# Patient Record
Sex: Female | Born: 1950 | Race: White | Hispanic: No | Marital: Married | Smoking: Never smoker
Health system: Southern US, Community
[De-identification: ages and names within clinical notes are randomized; demographics above are authoritative.]

## PROBLEM LIST (undated history)

## (undated) HISTORY — PX: KNEE SURGERY: SHX244

## (undated) HISTORY — PX: BACK SURGERY: SHX140

---

## 2002-07-15 ENCOUNTER — Encounter: Admission: RE | Admit: 2002-07-15 | Discharge: 2002-08-03 | Payer: Self-pay | Admitting: Unknown Physician Specialty

## 2004-12-11 ENCOUNTER — Ambulatory Visit (HOSPITAL_COMMUNITY): Admission: RE | Admit: 2004-12-11 | Discharge: 2004-12-11 | Payer: Self-pay | Admitting: *Deleted

## 2005-09-19 ENCOUNTER — Other Ambulatory Visit: Admission: RE | Admit: 2005-09-19 | Discharge: 2005-09-19 | Payer: Self-pay | Admitting: Unknown Physician Specialty

## 2006-03-10 ENCOUNTER — Emergency Department (HOSPITAL_COMMUNITY): Admission: EM | Admit: 2006-03-10 | Discharge: 2006-03-10 | Payer: Self-pay | Admitting: Emergency Medicine

## 2007-03-31 ENCOUNTER — Ambulatory Visit (HOSPITAL_COMMUNITY): Admission: RE | Admit: 2007-03-31 | Discharge: 2007-03-31 | Payer: Self-pay | Admitting: *Deleted

## 2007-04-07 ENCOUNTER — Ambulatory Visit (HOSPITAL_COMMUNITY): Admission: RE | Admit: 2007-04-07 | Discharge: 2007-04-07 | Payer: Self-pay | Admitting: *Deleted

## 2007-04-10 ENCOUNTER — Ambulatory Visit (HOSPITAL_COMMUNITY): Admission: RE | Admit: 2007-04-10 | Discharge: 2007-04-10 | Payer: Self-pay | Admitting: *Deleted

## 2007-05-11 ENCOUNTER — Encounter: Admission: RE | Admit: 2007-05-11 | Discharge: 2007-05-22 | Payer: Self-pay | Admitting: Neurosurgery

## 2007-10-21 ENCOUNTER — Emergency Department (HOSPITAL_COMMUNITY): Admission: EM | Admit: 2007-10-21 | Discharge: 2007-10-21 | Payer: Self-pay | Admitting: Emergency Medicine

## 2007-10-25 ENCOUNTER — Emergency Department (HOSPITAL_COMMUNITY): Admission: EM | Admit: 2007-10-25 | Discharge: 2007-10-26 | Payer: Self-pay | Admitting: Emergency Medicine

## 2008-02-19 ENCOUNTER — Encounter: Admission: RE | Admit: 2008-02-19 | Discharge: 2008-02-19 | Payer: Self-pay | Admitting: Neurosurgery

## 2008-02-25 ENCOUNTER — Observation Stay (HOSPITAL_COMMUNITY): Admission: RE | Admit: 2008-02-25 | Discharge: 2008-02-26 | Payer: Self-pay | Admitting: Neurosurgery

## 2009-04-04 ENCOUNTER — Encounter: Admission: RE | Admit: 2009-04-04 | Discharge: 2009-04-04 | Payer: Self-pay | Admitting: Family Medicine

## 2010-12-16 ENCOUNTER — Encounter: Payer: Self-pay | Admitting: *Deleted

## 2011-04-09 NOTE — Op Note (Signed)
NAMEPEBBLES, ZEIDERS                  ACCOUNT NO.:  [id number]   MEDICAL RECORD NO.:  [id number]          PATIENT TYPE:  INP   LOCATION:  3172                         FACILITY:  [facility]   PHYSICIAN:  Clydene Fake, M.D.  DATE OF BIRTH:  [birth year]   DATE OF PROCEDURE:  02/25/2008  DATE OF DISCHARGE:                               OPERATIVE REPORT   PREOPERATIVE DIAGNOSES:  Lumbar stenosis, spondylosis, herniated nucleus  pulposus with radiculopathy, right side L5-S1.   POSTOPERATIVE DIAGNOSIS:  Lumbar stenosis, spondylosis, herniated  nucleus pulposus with radiculopathy, right side L5-S1.   PROCEDURE:  Right L5 and S1 (two levels).  Decompressive laminectomy,  diskectomy, microdissection with microscope.   SURGEON:  Clydene Fake, M.D.   ASSISTANT:  Coletta Memos, M.D.   ANESTHESIA:  General endotracheal tube anesthesia.   BLOOD LOSS:  Minimal.   BLOOD GIVEN:  None.   DRAINS:  None.   COMPLICATIONS:  None.   REASON FOR PROCEDURE:  The patient is a 60 year old woman who has had  back and right leg pain and numbness for some times.  Epidural  injections have helped but only for a brief period of time.  She has  been on NSIADS.  MRI shows spondylitic change at L5-S1 with facet  hypertrophy, disk herniation, lateral recess stenosis and compression S1  and 5 root.  The patient brought in for a decompressive laminectomy.   PROCEDURE IN DETAIL:  The patient brought to the operating room.  General anesthesia was induced.  The patient was placed in a prone  position on Wilson frame with all pressure points padded.  The patient  was prepped, draped in sterile fashion and site of incision injected  with 10 mL of lidocaine with epinephrine.  The needle was placed in  interspace.  X-rays obtained and showed the needle was pointed at 5-1  interspace.  An incision was then made in midline.  Incision taken down  to the fascia.  Hemostasis obtained with cauterization.  Fascia was  incised  and subperiosteal dissection was done over the L5 and S1 spinous  process lamina out to the facets on the right side.  Self-retaining  retractor was placed, marker was placed at interspace and another x-ray  was obtained confirming our positioning at the L5-S1 area.  Microscope  was brought in for microdissection.  A high-speed drill was used to  start decompressive laminectomy, medial facetectomy.  This completed  with Kerrison punches.  Ligamentum flavum was removed. Foraminotomy was  done over the S1 roots.  There is severe hypertrophic ligament, maybe  even a synovial cyst coming off the joint which was removed  decompressing the lateral recess.  There was hypertrophic facet out to  the foramen.  This was removed with the curettes.  We then explored the  epidural space, subligamentous disk herniation, the disk space incised.  Diskectomy was performed with pituitary rongeurs and curettes.  When we  were finished we had good decompression of both the right L5 and S1  roots and the central canal.  Hemostasis was obtained with bipolar  cauterization  and Gelfoam and thrombin.  Gelfoam was irrigated out.  We  irrigated with antibiotic solution.  We had good hemostasis and we had  good decompression of the central canal and both the L5 and S1 nerve  roots.  Retractors removed.  Fascia closed with 0 Vicryl interrupted  sutures.  Subcutaneous tissue closed with 0, 2-0 and 3-0 Vicryl  interrupted sutures.  Skin closed with benzoin, Steri-Strips.  Dressing  was placed.  The patient was placed back in supine position and woke  from anesthesia and transferred to recovery room in stable condition.           ______________________________  Clydene Fake, M.D.     JRH/MEDQ  D:  02/25/2008  T:  02/25/2008  Job:  [id number]

## 2011-04-09 NOTE — Op Note (Signed)
[name], [name]                  ACCOUNT NO.:  [id number]   MEDICAL RECORD NO.:  [id number]          PATIENT TYPE:  INP   LOCATION:  3172                         FACILITY:  [facility]   PHYSICIAN:  Clydene Fake, M.D.  DATE OF BIRTH:  May 10, 1951   DATE OF PROCEDURE:  02/25/2008  DATE OF DISCHARGE:                               OPERATIVE REPORT   PREOPERATIVE DIAGNOSIS:  Lumbar stenosis, spondylosis, herniated nucleus  pulposus with radiculopathy, right side L5-S1   POSTOPERATIVE DIAGNOSIS:  Lumbar stenosis, spondylosis, herniated  nucleus pulposus with radiculopathy, right side L5-S1.   PROCEDURE:  Right L5-S1 (two levels) decompressive laminectomy,  diskectomy, and microdissection.   SURGEON:  Dr. Phoebe Perch.   ASSISTANT:  Dr. Franky Macho.   General endotracheal tube anesthesia.   ESTIMATED BLOOD LOSS:  Minimal.   BLOOD GIVEN:  None.   DRAINS:  None.   COMPLICATIONS:  None.   REASON FOR PROCEDURE:  The patient is a 60 year old woman who has had  back and right leg pain and numbness.  Epidural injections did help but  did not last.  MRI shows stenosis, spondylosis, lateral recess stenosis,  L5-S1 level on the right side  __________  facets disk protrusion, and  compression of the 5 and S1 roots.  The patient brought in for  decompression.   PROCEDURE IN DETAIL:  The patient brought to operating table, anesthesia  induced.  The patient was placed in prone position in a Wilson frame  with all pressure points padded.  The patient was prepped and draped in  a sterile fashion.  Incision was injected 10 mL of 1% lidocaine with  epinephrine.  Needle was placed in the interspace.  X-rays were obtained  confirming our positioning at L5-S1.  Incision was then made midline  lower lumbar spine over where the needle was.  Incision taken down to  the fascia.  Hemostasis was obtained Bovie cauterization.  The fascia  was incised on the right side, and subperiosteal dissection was done at  L5-S1 spinous processs lamina out to the facet.  Self-retaining  retractors were placed.  Markers were placed at the interspace, and x-  rays were obtained confirming our positioning at L5-S1.  Microscope was  brought in for microdissection.  High-speed drill was used start a  depressive laminectomy and medial facetectomy and was completed Kerrison  punches.  The bone plate was removed.  We decompressed the central  canal, did a foraminotomy of the S1 root.  There was severe hypertrophic  ligament of the facet causing lateral recess stenosis and bit of a  synovial cyst was removed, decompressed the lateral recess ________  ligaments foramen compressing the 5 roots, and we removed those with the  curettes and explored the epidural space and the disk space, found a  large subligamentous disk herniation.  Disk space was incised and  diskectomy performed with pituitary rongeurs and curettes.  When we were  finished, we had good decompression of both the 5 and S1 roots and the  central canal.  Hemostasis was obtained with bipolar cauterization,  Gelfoam and thrombin.  Gelfoam was irrigated out.  We irrigated with  antibiotic solution with good hemostasis.  Retractors were removed.  Fascia was closed with 0 Vicryl interrupted suture.  Subcutaneous tissue  closed with 0, 2-0, and 3-0 Vicryl interrupted sutures.  Skin closed  with benzoin and Steri-Strips.  Dressing was placed.  The patient was  placed in a supine position, awoken from anesthesia, and transferred to  recovery in stable condition.           ______________________________  Clydene Fake, M.D.     JRH/MEDQ  D:  02/25/2008  T:  02/25/2008  Job:  [id number]

## 2011-04-12 ENCOUNTER — Other Ambulatory Visit: Payer: Self-pay | Admitting: Orthopedic Surgery

## 2011-04-12 ENCOUNTER — Encounter (HOSPITAL_COMMUNITY): Payer: BC Managed Care – PPO

## 2011-04-12 LAB — URINALYSIS, ROUTINE W REFLEX MICROSCOPIC
Glucose, UA: NEGATIVE mg/dL
Nitrite: NEGATIVE
Specific Gravity, Urine: 1.017 (ref 1.005–1.030)
pH: 6 (ref 5.0–8.0)

## 2011-04-12 LAB — TYPE AND SCREEN
ABO/RH(D): A POS
Antibody Screen: NEGATIVE

## 2011-04-12 LAB — SURGICAL PCR SCREEN
MRSA, PCR: NEGATIVE
Staphylococcus aureus: NEGATIVE

## 2011-04-12 LAB — COMPREHENSIVE METABOLIC PANEL
Albumin: 3.7 g/dL (ref 3.5–5.2)
BUN: 8 mg/dL (ref 6–23)
Calcium: 9.7 mg/dL (ref 8.4–10.5)
Creatinine, Ser: 0.62 mg/dL (ref 0.4–1.2)
GFR calc Af Amer: >60 mL/min (ref >60)
Total Bilirubin: 0.2 mg/dL — ABNORMAL LOW (ref 0.3–1.2)
Total Protein: 6.7 g/dL (ref 6.0–8.3)

## 2011-04-12 LAB — PROTIME-INR: INR: 0.99 (ref 0.00–1.49)

## 2011-04-12 LAB — CBC
Hemoglobin: 12.4 g/dL (ref 12.0–15.0)
MCV: 88.6 fL (ref 78.0–100.0)
Platelets: 269 10*3/uL (ref 150–400)
RBC: 4.4 MIL/uL (ref 3.87–5.11)
WBC: 5.2 10*3/uL (ref 4.0–10.5)

## 2011-04-12 LAB — APTT: aPTT: 31 seconds (ref 24–37)

## 2011-04-17 ENCOUNTER — Inpatient Hospital Stay (HOSPITAL_COMMUNITY)
Admission: RE | Admit: 2011-04-17 | Discharge: 2011-04-22 | DRG: 209 | Disposition: A | Discharge status: Home Health | Payer: BC Managed Care – PPO | Source: Ambulatory Visit | Attending: Orthopedic Surgery | Admitting: Orthopedic Surgery

## 2011-04-17 DIAGNOSIS — R110 Nausea: Secondary | ICD-10-CM | POA: Diagnosis not present

## 2011-04-17 DIAGNOSIS — Z87440 Personal history of urinary (tract) infections: Secondary | ICD-10-CM

## 2011-04-17 DIAGNOSIS — E039 Hypothyroidism, unspecified: Secondary | ICD-10-CM | POA: Diagnosis present

## 2011-04-17 DIAGNOSIS — K589 Irritable bowel syndrome without diarrhea: Secondary | ICD-10-CM | POA: Diagnosis present

## 2011-04-17 DIAGNOSIS — E785 Hyperlipidemia, unspecified: Secondary | ICD-10-CM | POA: Diagnosis present

## 2011-04-17 DIAGNOSIS — D62 Acute posthemorrhagic anemia: Secondary | ICD-10-CM | POA: Diagnosis not present

## 2011-04-17 DIAGNOSIS — Z01812 Encounter for preprocedural laboratory examination: Secondary | ICD-10-CM

## 2011-04-17 DIAGNOSIS — H9319 Tinnitus, unspecified ear: Secondary | ICD-10-CM | POA: Diagnosis present

## 2011-04-17 DIAGNOSIS — Z780 Asymptomatic menopausal state: Secondary | ICD-10-CM

## 2011-04-17 DIAGNOSIS — M1710 Unilateral primary osteoarthritis, unspecified knee: Principal | ICD-10-CM | POA: Diagnosis present

## 2011-04-18 LAB — BASIC METABOLIC PANEL
Calcium: 7.4 mg/dL — ABNORMAL LOW (ref 8.4–10.5)
GFR calc non Af Amer: >60 mL/min (ref >60)
Glucose, Bld: 121 mg/dL — ABNORMAL HIGH (ref 70–99)
Sodium: 136 mEq/L (ref 135–145)

## 2011-04-18 LAB — CBC
HCT: 27.9 % — ABNORMAL LOW (ref 36.0–46.0)
MCHC: 31.9 g/dL (ref 30.0–36.0)
Platelets: 162 10*3/uL (ref 150–400)
RDW: 13.2 % (ref 11.5–15.5)

## 2011-04-19 LAB — CBC
MCH: 28.9 pg (ref 26.0–34.0)
MCV: 89 fL (ref 78.0–100.0)
Platelets: 159 10*3/uL (ref 150–400)
RDW: 12.9 % (ref 11.5–15.5)
WBC: 6.5 10*3/uL (ref 4.0–10.5)

## 2011-04-19 LAB — BASIC METABOLIC PANEL
BUN: 5 mg/dL — ABNORMAL LOW (ref 6–23)
Creatinine, Ser: 0.49 mg/dL (ref 0.4–1.2)
GFR calc non Af Amer: >60 mL/min (ref >60)
Potassium: 3.9 mEq/L (ref 3.5–5.1)

## 2011-04-19 LAB — PROTIME-INR: Prothrombin Time: 16 seconds — ABNORMAL HIGH (ref 11.6–15.2)

## 2011-04-20 LAB — BASIC METABOLIC PANEL
Calcium: 8.3 mg/dL — ABNORMAL LOW (ref 8.4–10.5)
Creatinine, Ser: 0.53 mg/dL (ref 0.4–1.2)
GFR calc Af Amer: >60 mL/min (ref >60)

## 2011-04-20 LAB — CBC
MCH: 29.2 pg (ref 26.0–34.0)
MCHC: 33 g/dL (ref 30.0–36.0)
Platelets: 196 10*3/uL (ref 150–400)
RDW: 13 % (ref 11.5–15.5)

## 2011-04-20 LAB — PROTIME-INR
INR: 1.54 — ABNORMAL HIGH (ref 0.00–1.49)
Prothrombin Time: 18.7 seconds — ABNORMAL HIGH (ref 11.6–15.2)

## 2011-04-21 LAB — CBC
HCT: 23.9 % — ABNORMAL LOW (ref 36.0–46.0)
MCHC: 32.6 g/dL (ref 30.0–36.0)
RDW: 12.9 % (ref 11.5–15.5)

## 2011-04-21 LAB — PROTIME-INR
INR: 1.93 — ABNORMAL HIGH (ref 0.00–1.49)
Prothrombin Time: 22.2 seconds — ABNORMAL HIGH (ref 11.6–15.2)

## 2011-04-22 LAB — TYPE AND SCREEN
ABO/RH(D): A POS
Antibody Screen: NEGATIVE
Unit division: 0
Unit division: 0

## 2011-04-22 LAB — CBC
HCT: 29.4 % — ABNORMAL LOW (ref 36.0–46.0)
Hemoglobin: 9.7 g/dL — ABNORMAL LOW (ref 12.0–15.0)
MCV: 87.2 fL (ref 78.0–100.0)
RDW: 13.6 % (ref 11.5–15.5)
WBC: 5.1 10*3/uL (ref 4.0–10.5)

## 2011-04-27 NOTE — Op Note (Signed)
[name], [name]                  ACCOUNT NO.:  [id number]  MEDICAL RECORD NO.:  [id number]           PATIENT TYPE:  I  LOCATION:  0005                         FACILITY:  [facility]  PHYSICIAN:  Ollen Gross, M.D.    DATE OF BIRTH:  1951-01-19  DATE OF PROCEDURE: DATE OF DISCHARGE:                              OPERATIVE REPORT   PREOPERATIVE DIAGNOSIS:  Osteoarthritis, bilateral knees.  POSTOPERATIVE DIAGNOSIS:  Osteoarthritis, bilateral knees.  PROCEDURE:  Bilateral total knee arthroplasty.  SURGEON:  Ollen Gross, M.D.  ASSISTANT:  Alexzandrew L. Perkins, P.A.C.  ANESTHESIA:  Combined spinal and epidural.  ESTIMATED BLOOD LOSS:  Minimal.  DRAINS:  Autovac x1 each side.  TOURNIQUET TIME:  Left knee 31 minutes at 300 mmHg, right knee 28 minutes at 300 mmHg.  COMPLICATIONS:  None.  CONDITION:  Stable to recovery.  BRIEF CLINICAL NOTE:  [name] is a 60 year old female with advanced end- stage arthritis of both knees with progressively worsening pain and debilitating function of both.  She has had nonoperative management which has not been successful.  She presents now for bilateral total knee arthroplasty.  PROCEDURE IN DETAIL:  After successful administration of combined spinal and epidural anesthetic, tourniquets were placed high on both sides and both lower extremities are prepped and draped in usual sterile fashion. She was having slightly more symptoms on the left, so we started at the left knee.  Left lower extremity was wrapped in Esmarch, knee flexed and tourniquet inflated to 300 mmHg.  Midline incision was made with a 10 blade through subcutaneous tissue to the level of the extensor mechanism.  A fresh blade is used to make a medial parapatellar arthrotomy.  Soft tissue on the proximal medial tibia is subperiosteally elevated to the joint line with the knife and into the semimembranosus bursa with a Cobb elevator.  Soft tissue laterally is elevated  with attention being paid to avoiding patellar tendon on tibial tubercle. Patella was everted, knee flexed to 90 degrees and ACL and PCL removed. Drill was used to create a starting hole in the distal femur and canal was thoroughly irrigated.  The 5-degree left valgus alignment guide is placed.  Distal femoral cutting block is pinned to remove 10 mm off the distal femur.  Resection is made with an oscillating saw.  The tibia subluxed forward and the menisci are removed.  The extramedullary tibial alignment guide is placed, referencing proximally at the medial aspect of the tibial tubercle and distally along the second metatarsal axis and tibial crest.  The block is pinned to remove 2 mm of the more deficient medial side.  Tibial resection is made with an oscillating saw.  Size 3 is the most appropriate tibial component and proximal tibia is prepared to modular drill and keel punch for the size 3.  The femoral sizing guide is placed, size 3 is most appropriate on the femur.  Rotation is marked off epicondylar axis, confirmed by creating rectangular flexion gap at 90 degrees.  The block is pinned in this rotation.  The anterior, posterior and chamfer cuts made.  Intercondylar block is placed and  that cut was made.  Trial size 3 posterior stabilized femur was placed.  10-mm posterior stabilized rotating platform insert trial was placed.  With 10, full extension was achieved with excellent varus-valgus and anterior-posterior balance throughout full range of motion.  The patella was everted and the thickness measured to be 23 mm.  Freehand resection was taken to 13 mm, 35 template is placed, lug holes were drilled, trial patella was placed and it tracks normally.  Osteophytes removed off the posterior femur with the trial in place.  All trials are removed and the cut bone surfaces are prepared with pulsatile lavage.  Cement was mixed and once ready for implantation, a size 3 mobile bearing  tibial tray, size 3 posterior stabilized femur and 35 patella were cemented into place.  The patella was held with a clamp.  Trial of 10-mm insert was placed, knee held in full extension and all extruded cement removed.  When the cement is fully hardened, then the permanent 10-mm posterior stabilized rotating platform insert is placed in the tibial tray.  Wound was copiously irrigated with saline solution and the arthrotomy closed over an Autovac drain with interrupted #1 PDS.  Flexion against gravity is 140 degrees. Patella tracks normally.  The tourniquet released total time of 31 minutes on the left.  The Autovac was then hooked to suction.  Subcu was closed with interrupted 2-0 Vicryl and subcuticular running 4-0 Monocryl.  The knee was then loosely wrapped with an Esmarch for compression.  The right lower extremity was wrapped in Esmarch, knee flexed and tourniquet inflated to 300 mmHg.  Midline incision made with 10 blade through subcutaneous tissue to the level of the extensor mechanism. Fresh blade is used make a medial parapatellar arthrotomy and soft tissue releases were then performed.  Patella was everted, knee flexed to 90 degrees, ACL and PCL removed.  Drill was used to create a starting hole in the distal femur.  Canal was thoroughly irrigated.  5-degree right valgus alignment guide was placed and the distal femoral cutting block is pinned to remove 10 mm off the distal femur.  Resection is made with an oscillating saw.  The tibia subluxed forward and the menisci removed.  The extramedullary tibial alignment guide is placed, referencing proximally at the medial aspect of the tibial tubercle and distally along the second metatarsal axis and tibial crest.  The block is pinned to remove 2 mm off the more deficient medial side.  Tibial resection was made with an oscillating saw.  Size 3 is the most appropriate tibial component and the proximal tibia was prepared to modular  drill and keel punch for the size 3. Femoral preparation was then performed.  Femoral sizing guide is placed.  The size 3 is most appropriate on the femur.  Rotations marked at the epicondylar axis and confirmed by creating rectangular flexion gap at 90 degrees.  Block was pinned in that rotation and then the anterior, posterior and chamfer cuts are made.  Intercondylar block is placed and that cut was made.  Trial size 3 posterior stabilized femur was placed.  10-mm posterior stabilized rotating platform insert trial was placed.  With 10, full extensions achieved with excellent varus-valgus and anterior-posterior balance throughout full range of motion.  Patella was everted, thickness measured to be 23 mm.  Freehand resection taken to 13 mm, 35 template is placed, lug holes were drilled, trial patella was placed and it tracks normally.  Osteophytes removed off the posterior femur with the trial  in place.  All trials removed and the cut bone surfaces prepared with pulsatile lavage.  Cements mixed and once ready for implantation, the size 3 mobile bearing tibial tray, size 3 posterior stabilized femur, and 35 patella were cemented in place.  Patella was held with a clamp. Trial 10-mm inserts placed, knee held in full extension and all extruded cement removed.  When the cement is fully hardened, then the permanent 10-mm posterior stabilized rotating platform insert was placed into the tibial tray.  Wound was copiously irrigated with saline solution and the arthrotomy closed over Hemovac drain with interrupted #1 PDS.  Flexion against gravity to 140 degrees of patella tracks normally.  The tourniquet released total time of 28 minutes.  Subcu was closed with interrupted 2-0 Vicryl and subcuticular running 4-0 Monocryl.  The Autovac drain is hooked to suction.  Both incisions were then cleaned and dried and Steri-Strips and bulky sterile dressings applied.  She is placed into knee immobilizer  and was awakened and transported to recovery in stable condition.     Ollen Gross, M.D.     FA/MEDQ  D:  04/17/2011  T:  04/17/2011  Job:  [id number]  Electronically Signed by Ollen Gross M.D. on 04/27/2011 04:06:33 PM

## 2011-05-12 ENCOUNTER — Emergency Department (HOSPITAL_COMMUNITY): Payer: BC Managed Care – PPO

## 2011-05-12 ENCOUNTER — Emergency Department (HOSPITAL_COMMUNITY)
Admission: EM | Admit: 2011-05-12 | Discharge: 2011-05-12 | Disposition: A | Discharge status: Home / Self Care | Payer: BC Managed Care – PPO | Attending: Emergency Medicine | Admitting: Emergency Medicine

## 2011-05-12 DIAGNOSIS — F3289 Other specified depressive episodes: Secondary | ICD-10-CM | POA: Insufficient documentation

## 2011-05-12 DIAGNOSIS — M545 Low back pain, unspecified: Secondary | ICD-10-CM | POA: Insufficient documentation

## 2011-05-12 DIAGNOSIS — N201 Calculus of ureter: Secondary | ICD-10-CM | POA: Insufficient documentation

## 2011-05-12 DIAGNOSIS — R112 Nausea with vomiting, unspecified: Secondary | ICD-10-CM | POA: Insufficient documentation

## 2011-05-12 DIAGNOSIS — I10 Essential (primary) hypertension: Secondary | ICD-10-CM | POA: Insufficient documentation

## 2011-05-12 DIAGNOSIS — Z79899 Other long term (current) drug therapy: Secondary | ICD-10-CM | POA: Insufficient documentation

## 2011-05-12 DIAGNOSIS — Z96659 Presence of unspecified artificial knee joint: Secondary | ICD-10-CM | POA: Insufficient documentation

## 2011-05-12 DIAGNOSIS — F329 Major depressive disorder, single episode, unspecified: Secondary | ICD-10-CM | POA: Insufficient documentation

## 2011-05-12 LAB — URINALYSIS, ROUTINE W REFLEX MICROSCOPIC
Glucose, UA: 100 mg/dL — AB
Specific Gravity, Urine: 1.025 (ref 1.005–1.030)
pH: 7 (ref 5.0–8.0)

## 2011-05-12 LAB — DIFFERENTIAL
Eosinophils Relative: 0 % (ref 0–5)
Lymphocytes Relative: 17 % (ref 12–46)
Lymphs Abs: 2 10*3/uL (ref 0.7–4.0)
Neutro Abs: 9 10*3/uL — ABNORMAL HIGH (ref 1.7–7.7)

## 2011-05-12 LAB — URINE MICROSCOPIC-ADD ON

## 2011-05-12 LAB — COMPREHENSIVE METABOLIC PANEL
ALT: 14 U/L (ref 0–35)
Alkaline Phosphatase: 162 U/L — ABNORMAL HIGH (ref 39–117)
CO2: 25 mEq/L (ref 19–32)
Chloride: 97 mEq/L (ref 96–112)
GFR calc Af Amer: 55 mL/min — ABNORMAL LOW (ref >60)
GFR calc non Af Amer: 46 mL/min — ABNORMAL LOW (ref >60)
Glucose, Bld: 119 mg/dL — ABNORMAL HIGH (ref 70–99)
Total Protein: 8.5 g/dL — ABNORMAL HIGH (ref 6.0–8.3)

## 2011-05-12 LAB — CBC
HCT: 43 % (ref 36.0–46.0)
Hemoglobin: 14.2 g/dL (ref 12.0–15.0)
MCV: 87.8 fL (ref 78.0–100.0)
RBC: 4.9 MIL/uL (ref 3.87–5.11)
RDW: 13.4 % (ref 11.5–15.5)
WBC: 11.8 10*3/uL — ABNORMAL HIGH (ref 4.0–10.5)

## 2011-05-12 LAB — PROTIME-INR
INR: 1.29 (ref 0.00–1.49)
Prothrombin Time: 16.3 seconds — ABNORMAL HIGH (ref 11.6–15.2)

## 2011-05-12 LAB — POCT PREGNANCY, URINE: Preg Test, Ur: NEGATIVE

## 2011-05-14 ENCOUNTER — Ambulatory Visit: Payer: BC Managed Care – PPO | Admitting: Physical Therapy

## 2011-05-14 LAB — URINE CULTURE: Culture  Setup Time: 201206172107

## 2011-05-15 NOTE — Discharge Summary (Addendum)
[name], [name]                  ACCOUNT NO.:  [id number]  MEDICAL RECORD NO.:  [id number]  LOCATION:  1608                         FACILITY:  [facility]  PHYSICIAN:  Rozell Searing, [facility]    DATE OF BIRTH:  09/13/1951  DATE OF ADMISSION:  04/17/2011 DATE OF DISCHARGE:  04/22/2011                              DISCHARGE SUMMARY   ADMITTING DIAGNOSES: 1. End-stage arthritis of both knees. 2. Tinnitus. 3. Depression. 4. Hyperlipidemia. 5. Irritable bowel syndrome. 6. Hypothyroidism. 7. History of urinary tract infection. 8. Arthritis. 9. History of menopause.  DISCHARGE DIAGNOSES: 1. End-stage arthritis of both knees, status post bilateral total knee     arthroplasty. 2. Acute blood loss anemia. 3. Tinnitus. 4. Depression. 5. Hyperlipidemia. 6. Irritable bowel syndrome. 7. Hypothyroidism. 8. History of urinary tract infection. 9. Arthritis. 10.History of menopause.  LABORATORY DATA:  Postoperative day #1, the patient's hemoglobin had dropped to 8.9, hematocrit 27.9.  Her INR was 1.06.  Postop day 3, the patient's hemoglobin had dropped to 8.7, hematocrit 26.8.  Her INR had reached 1.26.  Postop day 4, hemoglobin was at 8.8.  Postoperative day 5, hemoglobin dropped to a low of 7.8.  She did require 2 units of packed red blood cells and her INR had reached 1.93.  Postoperative day 6, her hemoglobin was at 9.7 post transfusion and her INR was still subtherapeutic at 1.78.  Her chemistry was monitored throughout her hospital stay.  She had consistently elevated glucose, the high being 121 on postop day 1.  PROCEDURE:  On Apr 17, 2011, [name] was taken to the operating room by surgeon Dr. Ollen Gross and assistant, Ellwood Dense P.A.  She underwent bilateral total knee arthroplasty.  The procedure was performed under spinal anesthesia.  She received antibiotics preoperatively.  There are no complications with the procedure.  She was returned to the recovery room in  satisfactory condition.  HOSPITAL COURSE:  [name] was admitted to [facility] on Apr 17, 2011.  She underwent the above-stated procedure without complication.  After spending adequate time in the recovery room, she was then taken to the orthopedic floor.  She was started on Coumadin. Heparin protocol per pharmacy.  She was also on reduced dose PCA, morphine and muscle relaxants for pain control and the regular postoperative orders for total joint replacement.  Postoperative day 1, the patient was experiencing some nausea.  Her antiemetic was changed to Phenergan.  Postoperative day 2, the morphine PCA was discontinued and she was started on reduced dose of Dilaudid PCA for better pain control. Her Hemovac were pulled.  She did start physical therapy on postoperative day 2.  She was able to ambulate 5 feet in the morning session and 6 feet in the afternoon.  Postop day 3, she progressed and was able to walk, able to ambulate 24 feet in the morning and 35 feet in the afternoon session and work on transfers.  On postoperative day 4, she received 2 units of packed red blood cells for acute blood loss anemia and all home health arrangements were made.  She continued to progress with physical therapy and on postoperative day 5, she was  discharged to home.  MEDICATIONS ON DISCHARGE: 1. Aleve, this will be discontinued while the patient is on Coumadin. 2. Fish oil, this will be discontinued while she is on Coumadin. 3. Livalo. 4. Vitamin D2 discontinued while on Coumadin. 5. Xanax. 6. Percocet. 7. Levothyroxine. 8. Zyrtec. 9. Lexapro.  ACTIVITY:  The patient will increase her activity slowly.  She will have home therapy come out to the house to work with her.  DIET:  No restrictions.  WOUND CARE:  She will need daily dressing change.  FOLLOWUP:  She will follow up with Dr. Lequita Halt in 2 weeks from the day of surgery.  CONDITION ON DISCHARGE:  Improving.     Rozell Searing, [facility]     LD/MEDQ  D:  05/13/2011  T:  05/13/2011  Job:  [id number]  Electronically Signed by Rozell Searing  on 05/15/2011 08:00:20 AM Electronically Signed by Ranee Gosselin M.D. on 06/07/2011 03:43:41 PM

## 2011-05-21 ENCOUNTER — Ambulatory Visit: Payer: BC Managed Care – PPO | Attending: Orthopedic Surgery | Admitting: Physical Therapy

## 2011-05-21 DIAGNOSIS — R269 Unspecified abnormalities of gait and mobility: Secondary | ICD-10-CM | POA: Insufficient documentation

## 2011-05-21 DIAGNOSIS — R5381 Other malaise: Secondary | ICD-10-CM | POA: Insufficient documentation

## 2011-05-21 DIAGNOSIS — IMO0001 Reserved for inherently not codable concepts without codable children: Secondary | ICD-10-CM | POA: Insufficient documentation

## 2011-05-21 DIAGNOSIS — M25669 Stiffness of unspecified knee, not elsewhere classified: Secondary | ICD-10-CM | POA: Insufficient documentation

## 2011-05-21 DIAGNOSIS — M25569 Pain in unspecified knee: Secondary | ICD-10-CM | POA: Insufficient documentation

## 2011-05-22 ENCOUNTER — Ambulatory Visit: Payer: BC Managed Care – PPO | Admitting: Physical Therapy

## 2011-05-23 ENCOUNTER — Ambulatory Visit: Payer: BC Managed Care – PPO | Admitting: Physical Therapy

## 2011-06-10 NOTE — H&P (Signed)
  [name], [name]                  ACCOUNT NO.:  [id number]  MEDICAL RECORD NO.:  [id number]  LOCATION:  1608                         FACILITY:  [facility]  PHYSICIAN:  Ollen Gross, M.D.    DATE OF BIRTH:  1951-11-24  DATE OF ADMISSION:  04/17/2011 DATE OF DISCHARGE:  04/22/2011                             HISTORY & PHYSICAL   CHIEF COMPLAINT:  Bilateral knee pain.  HISTORY OF PRESENT ILLNESS:  The patient is a 60 year old female who has been seen by Dr. Lequita Halt for ongoing bilateral knee pain.  She has known arthritis in both knees.  It is felt she would benefit from undergoing surgical intervention.  Risks and benefits have been discussed versus doing one versus two.  She elects to proceed with bilateral knee arthroplasties.  ALLERGIES:  No known drug allergies.  INTOLERANCES:  Occasionally Percocet will cause nausea, although she is able to take low-dose Percocet and tolerate this and it appears to be more dose dependent.  CURRENT MEDICATIONS:  Lexapro, Percocet, levothyroxine, Zyrtec, Zantac, fish oil, Levola, Septra DS, Voltaren.  PAST MEDICAL HISTORY:  Tinnitus, history depression, hypercholesterolemia, irritable bowel syndrome, hypothyroidism, history of urinary tract infection, postmenopausal.  PAST SURGICAL HISTORY:  Cesarean section x2 in 1986 and 1988, and disk surgery.  FAMILY HISTORY:  Father with hypertension.  Mother with heart failure.  SOCIAL HISTORY:  Married.  Nutrition Production designer, theatre/television/film.  Nonsmoker.  No alcohol. She does have caregiver lined up.  REVIEW OF SYSTEMS:  GENERAL:  No fevers, chills, night sweats.  NEURO: Little bit of ringing in her ears.  No seizures, syncope or paralysis. RESPIRATORY:  No shortness breath, productive cough or hemoptysis. CARDIOVASCULAR:  No chest pain, angina, or orthopnea.  GI:  Some intermittent diarrhea and constipation.  She does have history of IBS. GU:  No dysuria, hematuria or discharge.  MUSCULOSKELETAL:  Knee  pain.  PHYSICAL EXAMINATION:  VITAL SIGNS:  Pulse 84, respirations 12, blood pressure 124/78. GENERAL:  A 60 year old white female, well nourished, well developed, no acute distress.  She is alert, oriented, cooperative, pleasant, good historian, accompanied by her husband. HEENT:  Normocephalic, atraumatic.  Pupils round and reactive.  EOMs intact. NECK:  Supple. CHEST:  Clear. HEART:  Regular rate and rhythm without murmur.  S1, S2 noted. ABDOMEN:  Soft, slight round.  Bowel sounds present. RECTAL:  Not done, not pertinent to present illness. BREASTS:  Not done, not pertinent to present illness. GENITALIA:  Not done, not pertinent to present illness. EXTREMITIES:  Right knee, range of motion 0 to 125, marked crepitus, tender more medial than lateral left knee.  Range of motion 5 to 125, marked crepitus, tender more medial than lateral.  IMPRESSION:  Osteoarthritis, bilateral knees.  PLAN:  The patient admitted to [facility], undergo bilateral total knee replacement arthroplasty.  Surgery will be performed by Dr. Ollen Gross.     [name], P.A.C.   ______________________________ Ollen Gross, M.D.    ALP/MEDQ  D:  06/06/2011  T:  06/06/2011  Job:  [id number]  Electronically Signed by Patrica Duel P.A.C. on 06/06/2011 12:35:49 PM Electronically Signed by Ollen Gross M.D. on 06/10/2011 06:51:07 AM

## 2011-08-20 LAB — PROTIME-INR
INR: 0.9
Prothrombin Time: 11.8

## 2011-08-20 LAB — BASIC METABOLIC PANEL
Calcium: 9.4
Chloride: 106
Creatinine, Ser: 0.79
GFR calc Af Amer: >60

## 2011-08-20 LAB — URINALYSIS, ROUTINE W REFLEX MICROSCOPIC
Nitrite: NEGATIVE
Protein, ur: NEGATIVE
Urobilinogen, UA: 0.2

## 2011-08-20 LAB — CBC
MCV: 88.1
RBC: 4.47
WBC: 6.8

## 2011-08-20 LAB — APTT: aPTT: 30

## 2011-09-03 LAB — CBC
Hemoglobin: 13.3
RBC: 4.51
RDW: 12.9

## 2011-09-03 LAB — BASIC METABOLIC PANEL
Calcium: 9.3
Calcium: 9.6
Creatinine, Ser: 0.71
GFR calc Af Amer: >60
GFR calc Af Amer: >60
GFR calc non Af Amer: >60
Sodium: 137

## 2011-09-03 LAB — URINALYSIS, ROUTINE W REFLEX MICROSCOPIC
Bilirubin Urine: NEGATIVE
Leukocytes, UA: NEGATIVE
Nitrite: NEGATIVE
Specific Gravity, Urine: 1.022
pH: 7

## 2011-09-03 LAB — DIFFERENTIAL
Basophils Absolute: 0.1
Lymphocytes Relative: 25
Monocytes Absolute: 0.3
Monocytes Relative: 4
Neutro Abs: 5.3
Neutrophils Relative %: 68

## 2015-07-11 ENCOUNTER — Encounter (HOSPITAL_COMMUNITY): Payer: Self-pay | Admitting: Emergency Medicine

## 2015-07-11 ENCOUNTER — Emergency Department (HOSPITAL_COMMUNITY)
Admission: EM | Admit: 2015-07-11 | Discharge: 2015-07-11 | Disposition: A | Discharge status: Home / Self Care | Payer: BC Managed Care – PPO | Attending: Emergency Medicine | Admitting: Emergency Medicine

## 2015-07-11 DIAGNOSIS — T63481A Toxic effect of venom of other arthropod, accidental (unintentional), initial encounter: Secondary | ICD-10-CM | POA: Diagnosis present

## 2015-07-11 DIAGNOSIS — Y998 Other external cause status: Secondary | ICD-10-CM | POA: Insufficient documentation

## 2015-07-11 DIAGNOSIS — Y9289 Other specified places as the place of occurrence of the external cause: Secondary | ICD-10-CM | POA: Insufficient documentation

## 2015-07-11 DIAGNOSIS — Y9389 Activity, other specified: Secondary | ICD-10-CM | POA: Diagnosis not present

## 2015-07-11 MED ORDER — FAMOTIDINE 20 MG PO TABS
20.0000 mg | ORAL_TABLET | Freq: Once | ORAL | Status: AC
  Administered 2015-07-11: 20 mg via ORAL
  Filled 2015-07-11: qty 1

## 2015-07-11 MED ORDER — DIPHENHYDRAMINE HCL 25 MG PO CAPS
50.0000 mg | ORAL_CAPSULE | Freq: Once | ORAL | Status: AC
  Administered 2015-07-11: 50 mg via ORAL
  Filled 2015-07-11: qty 2

## 2015-07-11 MED ORDER — HYDROCODONE-ACETAMINOPHEN 5-325 MG PO TABS
1.0000 | ORAL_TABLET | Freq: Once | ORAL | Status: AC
  Administered 2015-07-11: 1 via ORAL
  Filled 2015-07-11: qty 1

## 2015-07-11 MED ORDER — HYDROCODONE-ACETAMINOPHEN 5-325 MG PO TABS: 1.0000 | ORAL_TABLET | ORAL | Status: DC | PRN

## 2015-07-11 MED ORDER — IBUPROFEN 400 MG PO TABS
400.0000 mg | ORAL_TABLET | Freq: Once | ORAL | Status: AC
  Administered 2015-07-11: 400 mg via ORAL
  Filled 2015-07-11: qty 1

## 2015-07-11 NOTE — Discharge Instructions (Signed)

## 2015-07-11 NOTE — ED Notes (Signed)
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

## 2015-07-11 NOTE — ED Provider Notes (Signed)
CSN: [id number]     Arrival date & time 07/11/15  2018 History  This chart was scribed for Orpah Greek, MD by Irene Pap, ED Scribe. This patient was seen in room APA11/AP[facility]11 and patient care was started at 8:56 PM.   Chief Complaint  Patient presents with  . Insect Bite   The history is provided by the patient. No language interpreter was used.  HPI Comments: [name] is a 64 y.o. female who presents to the Emergency Department complaining of an insect bite to the upper left arm onset 2 days ago. Pt states that she does not know what exactly the bite was caused by. States that the bump has become increasingly larger, redder, more painful and has tingling radiating down her arm. She denies any other symptoms.   History reviewed. No pertinent past medical history. Past Surgical History  Procedure Laterality Date  . Cesarean section    . Knee surgery      Bilaterally   No family history on file. Social History  Substance Use Topics  . Smoking status: Never Smoker   . Smokeless tobacco: None  . Alcohol Use: No   OB History    No data available     Review of Systems  Skin: Positive for rash.  Neurological:       Tingling down the left arm  All other systems reviewed and are negative.     Allergies  Bee venom  Home Medications   Prior to Admission medications   Not on File   BP 152/67 mmHg  Pulse 89  Temp(Src) 98.1 F (36.7 C) (Oral)  Resp 20  Ht 5\' 5"  (1.651 m)  Wt 175 lb (79.379 kg)  BMI 29.12 kg/m2  SpO2 100%  Physical Exam  Constitutional: She is oriented to person, place, and time. She appears well-developed and well-nourished. No distress.  HENT:  Head: Normocephalic and atraumatic.  Right Ear: Hearing normal.  Left Ear: Hearing normal.  Nose: Nose normal.  Mouth/Throat: Oropharynx is clear and moist and mucous membranes are normal.  Eyes: Conjunctivae and EOM are normal. Pupils are equal, round, and reactive to light.  Neck: Normal  range of motion. Neck supple.  Cardiovascular: Regular rhythm, S1 normal and S2 normal.  Exam reveals no gallop and no friction rub.   No murmur heard. Pulmonary/Chest: Effort normal and breath sounds normal. No respiratory distress. She exhibits no tenderness.  Abdominal: Soft. Normal appearance and bowel sounds are normal. There is no hepatosplenomegaly. There is no tenderness. There is no rebound, no guarding, no tenderness at McBurney's point and negative Murphy's sign. No hernia.  Musculoskeletal: Normal range of motion.  Neurological: She is alert and oriented to person, place, and time. She has normal strength. No cranial nerve deficit or sensory deficit. Coordination normal. GCS eye subscore is 4. GCS verbal subscore is 5. GCS motor subscore is 6.  Skin: Skin is warm, dry and intact. No rash noted. No cyanosis.  Slightly raised erythematous patch left bicep region, mildly warm and tender to the touch, tiny central raised area  Psychiatric: She has a normal mood and affect. Her speech is normal and behavior is normal. Thought content normal.  Nursing note and vitals reviewed.   ED Course  Procedures (including critical care time) DIAGNOSTIC STUDIES: Oxygen Saturation is 100% on RA, normal by my interpretation.    COORDINATION OF CARE: 8:59 PM-Discussed treatment plan which includes icing the area, pain medication, and monitoring the pt with pt  at bedside and pt agreed to plan.   Labs Review Labs Reviewed - No data to display  Imaging Review No results found.    EKG Interpretation None      MDM   Final diagnoses:  None  insect sting   Presents to the ER for evaluation of concern over a bite or sting to left arm. 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 Patient is not expressing any systemic symptoms. She does have evidence of local reaction at the site, has been monitored here in the ER for a period of time. She has been provided with Benadryl, Pepcid and has  done well. Upon filling, throat swelling, difficulty breathing. Examination was otherwise unremarkable. Patient will continue to ice the area, use Benadryl as needed.  I personally performed the services described in this documentation, which was scribed in my presence. The recorded information has been reviewed and is accurate.      Orpah Greek, MD 07/11/15 2219

## 2015-07-11 NOTE — ED Notes (Signed)
Unknown insect bite 2 hours ago. Redness to left upper arm getting larger and painful.

## 2015-07-26 MED FILL — Hydrocodone-Acetaminophen Tab 5-325 MG: ORAL | Qty: 6 | Status: AC

## 2016-08-08 ENCOUNTER — Encounter: Payer: Self-pay | Admitting: Physician Assistant

## 2016-08-08 ENCOUNTER — Ambulatory Visit (INDEPENDENT_AMBULATORY_CARE_PROVIDER_SITE_OTHER): Payer: BC Managed Care – PPO | Admitting: Physician Assistant

## 2016-08-08 VITALS — BP 129/80 | HR 92 | Temp 97.9°F | Ht 65.0 in | Wt 182.6 lb

## 2016-08-08 DIAGNOSIS — N6012 Diffuse cystic mastopathy of left breast: Secondary | ICD-10-CM

## 2016-08-08 DIAGNOSIS — N644 Mastodynia: Secondary | ICD-10-CM

## 2016-08-08 DIAGNOSIS — N6011 Diffuse cystic mastopathy of right breast: Secondary | ICD-10-CM

## 2016-08-08 NOTE — Progress Notes (Signed)
BP 129/80 (BP Location: Right Arm, Patient Position: Sitting, Cuff Size: Large)   Pulse 92   Temp 97.9 F (36.6 C) (Oral)   Ht 5\' 5"  (1.651 m)   Wt 182 lb 9.6 oz (82.8 kg)   BMI 30.39 kg/m    Subjective:    Patient ID: [name], female    DOB: 09/04/1951, 65 y.o.   MRN: [id number]  [name] is a 65 y.o. female presenting on 08/08/2016 for Breast Pain (left breast-covered in moles )  HPI Patient here to be established as new patient at [place].  This patient is known to me from [facility]. Medical history and meds are reviewed.   Today she has left breast pain and fibrocystic changes bilaterally, She is due a mammogram at this time and when she called to get her appointment and because of her current issues she needs diagnostics scheduled through [place]. We will certainly get this arranged. Some of the pain is worsened with pulling and pushing. In addition more moles are present on the breast. Denies fever and chills.   Relevant past medical, surgical, family and social history reviewed and updated as indicated. Interim medical history since our last visit reviewed. Allergies and medications reviewed and updated.   Data reviewed from any sources in EPIC.  Review of Systems  Constitutional: Negative.  Negative for activity change, fatigue and fever.  HENT: Negative.   Eyes: Negative.   Respiratory: Negative.  Negative for cough.   Cardiovascular: Negative.  Negative for chest pain.  Gastrointestinal: Negative.  Negative for abdominal pain.  Endocrine: Negative.   Genitourinary: Negative.  Negative for dysuria.  Musculoskeletal: Positive for myalgias.  Skin: Negative.   Neurological: Negative.     Per HPI unless specifically indicated above  Social History   Social History  . Marital status: Married    Spouse name: N/A  . Number of children: N/A  . Years of education: N/A   Occupational History  . Not on file.   Social  History Main Topics  . Smoking status: Never Smoker  . Smokeless tobacco: Not on file  . Alcohol use No  . Drug use: Unknown  . Sexual activity: Not on file   Other Topics Concern  . Not on file   Social History Narrative  . No narrative on file    Past Surgical History:  Procedure Laterality Date  . CESAREAN SECTION    . KNEE SURGERY     Bilaterally    No family history on file.    Medication List       Accurate as of 08/08/16  3:06 PM. Always use your most recent med list.          ALPRAZolam 0.5 MG tablet Commonly known as:  XANAX Take 0.5 mg by mouth at bedtime as needed. For sleep   escitalopram 10 MG tablet Commonly known as:  LEXAPRO Take 10 mg by mouth every evening.   FISH OIL PO Take 1 capsule by mouth daily.          Objective:    BP 129/80 (BP Location: Right Arm, Patient Position: Sitting, Cuff Size: Large)   Pulse 92   Temp 97.9 F (36.6 C) (Oral)   Ht 5\' 5"  (1.651 m)   Wt 182 lb 9.6 oz (82.8 kg)   BMI 30.39 kg/m   Allergies  Allergen Reactions  . Bee Venom Swelling   Wt Readings from Last 3 Encounters:  08/08/16 182 lb 9.6 oz (82.8 kg)  07/11/15 175 lb (79.4 kg)    Physical Exam  Constitutional: She is oriented to person, place, and time. She appears well-developed and well-nourished.  HENT:  Head: Normocephalic and atraumatic.  Eyes: Conjunctivae and EOM are normal. Pupils are equal, round, and reactive to light.  Cardiovascular: Normal rate, regular rhythm, normal heart sounds and intact distal pulses.   Pulmonary/Chest: Effort normal and breath sounds normal. She exhibits tenderness. She exhibits no bony tenderness and no retraction. Right breast exhibits no inverted nipple, no mass, no nipple discharge, no skin change and no tenderness. Left breast exhibits skin change and tenderness. Left breast exhibits no inverted nipple, no mass and no nipple discharge. Breasts are symmetrical.    Prominent vein on left breast from  9o'clock to top of areola  Abdominal: Soft. Bowel sounds are normal.  Neurological: She is alert and oriented to person, place, and time. She has normal reflexes.  Skin: Skin is warm and dry. No rash noted. No pallor.  Psychiatric: She has a normal mood and affect. Her behavior is normal. Judgment and thought content normal.  Nursing note and vitals reviewed.       Assessment & Plan:   1. Pain of left breast Plan diagnostic mammogram studies - MM Digital Diagnostic Bilat; Future  2. Fibrocystic breast changes, bilateral - MM Digital Diagnostic Bilat; Future   Continue all other maintenance medications as listed above. Educational handout given for fibrocystic breasts  Follow up plan: Follow for routine check and physical soon and annual lab studies.  Terald Sleeper PA-C [facility] [address]  [place], [place] [address] [phone]   08/08/2016, 3:06 PM

## 2016-08-08 NOTE — Patient Instructions (Signed)
Fibrocystic Breast Changes °Fibrocystic breast changes occur when breast ducts become blocked, causing painful, fluid-filled lumps (cysts) to form in the breast. This is a common condition that is noncancerous (benign). It occurs when women go through hormonal changes during their menstrual cycle. Fibrocystic breast changes can affect one or both breasts. °CAUSES  °The exact cause of fibrocystic breast changes is not known, but it may be related to the female hormones estrogen and progesterone. Family traits that get passed from parent to child (genetics) may also be a factor in some cases. °SIGNS AND SYMPTOMS  °· Tenderness, mild discomfort, or pain.   °· Swelling.   °· Rope-like feeling when touching the breast.   °· Lumpy breast, one or both sides.   °· Changes in breast size, especially before (larger) and after (smaller) the menstrual period.   °· Green or dark brown nipple discharge (not blood).   °Symptoms are usually worse before menstrual periods start and get better toward the end of the menstrual period.  °DIAGNOSIS  °To make a diagnosis, your health care provider will ask you questions and perform a physical exam of your breasts. The health care provider may recommend other tests that can examine inside your breasts, such as: °· A breast X-ray (mammogram).   °· Ultrasonography.  °· An MRI.   °If something more than fibrocystic breast changes is suspected, your health care provider may take a breast tissue sample (breast biopsy) to examine. °TREATMENT  °Often, treatment is not needed. Your health care provider may recommend over-the-counter pain relievers to help lessen pain or discomfort caused by the fibrocystic breast changes. You may also be asked to change your diet to limit or stop eating foods or drinking beverages that contain caffeine. Foods and beverages that contain caffeine include chocolate, soda, coffee, and tea. Reducing sugar and fat in your diet may also help. Your health care provider  may also recommend: °· Fine needle aspiration to remove fluid from a cyst that is causing pain.   °· Surgery to remove a large, persistent, and tender cyst. °HOME CARE INSTRUCTIONS  °· Examine your breasts after every menstrual period. If you do not have menstrual periods, check your breasts the first day of every month. Feel for changes, such as more tenderness, a new growth, a change in breast size, or a change in a lump that has always been there.   °· Only take over-the-counter or prescription medicine as directed by your health care provider.   °· Wear a well-fitted support or sports bra, especially when exercising.   °· Decrease or avoid caffeine, fat, and sugar in your diet as directed by your health care provider.   °SEEK MEDICAL CARE IF:  °· You have fluid leaking (discharge) from your nipples, especially bloody discharge.   °· You have new lumps or bumps in the breast.   °· Your breast or breasts become enlarged, red, and painful.   °· You have areas of your breast that pucker in.   °· Your nipples appear flat or indented.   °  °This information is not intended to replace advice given to you by your health care provider. Make sure you discuss any questions you have with your health care provider. °  °Document Released: 08/28/2006 Document Revised: 08/02/2015 Document Reviewed: 05/02/2013 °Elsevier Interactive Patient Education ©2016 [employer] ° ° °

## 2016-08-14 ENCOUNTER — Other Ambulatory Visit: Payer: Self-pay | Admitting: Physician Assistant

## 2016-08-14 DIAGNOSIS — N644 Mastodynia: Secondary | ICD-10-CM

## 2016-08-20 ENCOUNTER — Ambulatory Visit
Admission: RE | Admit: 2016-08-20 | Discharge: 2016-08-20 | Disposition: A | Discharge status: Home / Self Care | Payer: BC Managed Care – PPO | Source: Ambulatory Visit | Attending: Physician Assistant | Admitting: Physician Assistant

## 2016-08-20 DIAGNOSIS — N6011 Diffuse cystic mastopathy of right breast: Secondary | ICD-10-CM

## 2016-08-20 DIAGNOSIS — N644 Mastodynia: Secondary | ICD-10-CM

## 2016-08-20 DIAGNOSIS — N6012 Diffuse cystic mastopathy of left breast: Secondary | ICD-10-CM

## 2016-11-13 ENCOUNTER — Telehealth: Payer: Self-pay | Admitting: Physician Assistant

## 2016-12-20 ENCOUNTER — Telehealth: Payer: Self-pay | Admitting: Physician Assistant

## 2016-12-20 MED ORDER — OSELTAMIVIR PHOSPHATE 75 MG PO CAPS: 75.0000 mg | ORAL_CAPSULE | Freq: Two times a day (BID) | ORAL | 0 refills | Status: DC

## 2016-12-20 NOTE — Telephone Encounter (Signed)
Prescription sent to pharmacy  sent

## 2016-12-20 NOTE — Telephone Encounter (Signed)
Patient aware.

## 2016-12-23 ENCOUNTER — Encounter: Payer: Self-pay | Admitting: Nurse Practitioner

## 2016-12-23 ENCOUNTER — Ambulatory Visit (INDEPENDENT_AMBULATORY_CARE_PROVIDER_SITE_OTHER): Payer: BC Managed Care – PPO | Admitting: Nurse Practitioner

## 2016-12-23 ENCOUNTER — Ambulatory Visit (INDEPENDENT_AMBULATORY_CARE_PROVIDER_SITE_OTHER): Payer: BC Managed Care – PPO

## 2016-12-23 VITALS — BP 132/75 | HR 99 | Temp 97.6°F | Ht 65.0 in | Wt 177.0 lb

## 2016-12-23 DIAGNOSIS — J209 Acute bronchitis, unspecified: Secondary | ICD-10-CM | POA: Diagnosis not present

## 2016-12-23 DIAGNOSIS — R05 Cough: Secondary | ICD-10-CM | POA: Diagnosis not present

## 2016-12-23 DIAGNOSIS — R059 Cough, unspecified: Secondary | ICD-10-CM

## 2016-12-23 MED ORDER — PREDNISONE 20 MG PO TABS: ORAL_TABLET | ORAL | 0 refills | Status: DC

## 2016-12-23 MED ORDER — LEVALBUTEROL HCL 1.25 MG/3ML IN NEBU
1.2500 mg | INHALATION_SOLUTION | RESPIRATORY_TRACT | Status: AC
  Administered 2016-12-23: 1.25 mg via RESPIRATORY_TRACT

## 2016-12-23 MED ORDER — AZITHROMYCIN 250 MG PO TABS: ORAL_TABLET | ORAL | 0 refills | Status: DC

## 2016-12-23 MED ORDER — HYDROCODONE-HOMATROPINE 5-1.5 MG/5ML PO SYRP: 5.0000 mL | ORAL_SOLUTION | Freq: Four times a day (QID) | ORAL | 0 refills | Status: DC | PRN

## 2016-12-23 NOTE — Progress Notes (Signed)
Subjective:    Patient ID: [name], female    DOB: 1951/01/30, 66 y.o.   MRN: [id number]  HPI Patient comes in c/o cough and congestion that has been intermit since November. 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 Cough is getting worse instead of better.   Review of Systems  Constitutional: Positive for appetite change (decreased), chills and fever (?).  HENT: Positive for congestion and rhinorrhea. Negative for ear discharge, ear pain, postnasal drip, sinus pain, sore throat, trouble swallowing and voice change.   Respiratory: Positive for cough (deep cough) and shortness of breath (occasionally).   Cardiovascular: Negative for chest pain, palpitations and leg swelling.  Gastrointestinal: Negative.   Genitourinary: Negative.   Neurological: Positive for headaches.  Psychiatric/Behavioral: Negative.   All other systems reviewed and are negative.      Objective:   Physical Exam  Constitutional: She is oriented to person, place, and time. She appears well-developed and well-nourished. She appears distressed (mild).  HENT:  Right Ear: Hearing, tympanic membrane, external ear and ear canal normal.  Left Ear: Hearing, tympanic membrane, external ear and ear canal normal.  Nose: Mucosal edema and rhinorrhea present. Right sinus exhibits no maxillary sinus tenderness and no frontal sinus tenderness. Left sinus exhibits no maxillary sinus tenderness and no frontal sinus tenderness.  Mouth/Throat: Mucous membranes are normal.  Eyes: Conjunctivae are normal. Pupils are equal, round, and reactive to light.  Neck: Normal range of motion. Neck supple.  Cardiovascular: Normal rate, regular rhythm and normal heart sounds.   Pulmonary/Chest: Effort normal. She has wheezes (exp scattered throughout lung foields).  Wet cough   Musculoskeletal: Normal range of motion.  Lymphadenopathy:    She has no cervical adenopathy.    Neurological: She is alert and oriented to person, place, and time.  Skin: Skin is warm.  Psychiatric: She has a normal mood and affect. Her behavior is normal. Judgment and thought content normal.   BP 132/75   Pulse 99   Temp 97.6 F (36.4 C) (Oral)   Ht 5\' 5"  (1.651 m)   Wt 177 lb (80.3 kg)   BMI 29.45 kg/m    chest x ray- chronic bronchitic changes   S/P xopenex treatment- loooser wheezes Assessment & Plan:  1. Cough - levalbuterol (XOPENEX) nebulizer solution 1.25 mg; Take 1.25 mg by nebulization now. - DG Chest 2 View; Future  2. Acute bronchitis, unspecified organism Meds ordered this encounter  Medications  . levalbuterol (XOPENEX) nebulizer solution 1.25 mg  . predniSONE (DELTASONE) 20 MG tablet    Sig: 2 po at sametime daily for 5 days    Dispense:  10 tablet    Refill:  0    Order Specific Question:   Supervising Provider    Answer:   VINCENT, CAROL L [[id number]]  . azithromycin (ZITHROMAX Z-PAK) 250 MG tablet    Sig: As directed    Dispense:  6 tablet    Refill:  0    Order Specific Question:   Supervising Provider    Answer:   VINCENT, CAROL L [[id number]]  . HYDROcodone-homatropine (HYCODAN) 5-1.5 MG/5ML syrup    Sig: Take 5 mLs by mouth every 6 (six) hours as needed for cough.    Dispense:  120 mL    Refill:  0    Order Specific Question:   Supervising Provider    Answer:   VINCENT, CAROL L [[id number]]   1. Take meds  as prescribed 2. Use a cool mist humidifier especially during the winter months and when heat has been humid. 3. Use saline nose sprays frequently 4. Saline irrigations of the nose can be very helpful if done frequently.  * 4X daily for 1 week*  * Use of a nettie pot can be helpful with this. Follow directions with this* 5. Drink plenty of fluids 6. Keep thermostat turn down low 7.For any cough or congestion  Use plain Mucinex- regular strength or max strength is fine   * Children- consult with Pharmacist for dosing 8. For fever or aces or pains-  take tylenol or ibuprofen appropriate for age and weight.  * for fevers greater than 101 orally you may alternate ibuprofen and tylenol every  3 hours.   Mary-Margaret Hassell Done, FNP

## 2016-12-23 NOTE — Patient Instructions (Signed)

## 2017-01-15 ENCOUNTER — Other Ambulatory Visit: Payer: Self-pay | Admitting: *Deleted

## 2017-01-15 MED ORDER — ESCITALOPRAM OXALATE 10 MG PO TABS: 10.0000 mg | ORAL_TABLET | Freq: Every evening | ORAL | 1 refills | Status: DC

## 2017-02-07 ENCOUNTER — Encounter: Payer: Self-pay | Admitting: Physician Assistant

## 2017-02-07 ENCOUNTER — Ambulatory Visit (INDEPENDENT_AMBULATORY_CARE_PROVIDER_SITE_OTHER): Payer: BC Managed Care – PPO | Admitting: Physician Assistant

## 2017-02-07 VITALS — BP 112/71 | HR 82 | Temp 97.6°F | Ht 65.0 in | Wt 182.0 lb

## 2017-02-07 DIAGNOSIS — G8929 Other chronic pain: Secondary | ICD-10-CM | POA: Insufficient documentation

## 2017-02-07 DIAGNOSIS — Z01419 Encounter for gynecological examination (general) (routine) without abnormal findings: Secondary | ICD-10-CM | POA: Insufficient documentation

## 2017-02-07 DIAGNOSIS — M25522 Pain in left elbow: Secondary | ICD-10-CM

## 2017-02-07 DIAGNOSIS — Z0000 Encounter for general adult medical examination without abnormal findings: Secondary | ICD-10-CM

## 2017-02-07 MED ORDER — ALPRAZOLAM 0.5 MG PO TABS: 0.5000 mg | ORAL_TABLET | Freq: Every evening | ORAL | 5 refills | Status: DC | PRN

## 2017-02-07 MED ORDER — NAPROXEN 500 MG PO TABS: 500.0000 mg | ORAL_TABLET | Freq: Two times a day (BID) | ORAL | 1 refills | Status: DC

## 2017-02-07 MED ORDER — ESCITALOPRAM OXALATE 10 MG PO TABS: 10.0000 mg | ORAL_TABLET | Freq: Every evening | ORAL | 3 refills | Status: DC

## 2017-02-07 NOTE — Progress Notes (Signed)
BP 112/71   Pulse 82   Temp 97.6 F (36.4 C) (Oral)   Ht '5\' 5"'$  (1.651 m)   Wt 182 lb (82.6 kg)   BMI 30.29 kg/m    Subjective:    Patient ID: [name], female    DOB: 06-09-1951, 65 y.o.   MRN: [id number]  HPI: [name] is a 66 y.o. female presenting on 02/07/2017 for Annual Exam and Elbow Pain (left and swollen )  This patient comes in for annual well physical examination. All medications are reviewed today. There are no reports of any problems with the medications. All of the medical conditions are reviewed and updated.  Lab work is reviewed and will be ordered as medically necessary. Worsening pain in medial lef telbow and noticeable deformity. 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 She is currently having to some lifting and taking care of small grandchild. The pain is predominantly in the medial portion of the left elbow does get warm at times. There's been no specific area of swelling like a bursa. She denies any fever or chills. Patient reports doing well overall.   History reviewed. No pertinent past medical history. Relevant past medical, surgical, family and social history reviewed and updated as indicated. Interim medical history since our last visit reviewed. Allergies and medications reviewed and updated. DATA REVIEWED: CHART IN EPIC  Social History   Social History  . Marital status: Married    Spouse name: N/A  . Number of children: N/A  . Years of education: N/A   Occupational History  . Not on file.   Social History Main Topics  . Smoking status: Never Smoker  . Smokeless tobacco: Never Used  . Alcohol use No  . Drug use: Unknown  . Sexual activity: Not on file   Other Topics Concern  . Not on file   Social History Narrative  . No narrative on file    Past Surgical History:  Procedure Laterality Date  . CESAREAN SECTION    . KNEE SURGERY     Bilaterally    History reviewed. No pertinent family  history.  Review of Systems  Constitutional: Negative.  Negative for activity change, fatigue and fever.  HENT: Negative.   Eyes: Negative.   Respiratory: Negative.  Negative for cough.   Cardiovascular: Negative.  Negative for chest pain.  Gastrointestinal: Negative.  Negative for abdominal pain.  Endocrine: Negative.   Genitourinary: Negative.  Negative for dysuria.  Musculoskeletal: Positive for arthralgias and joint swelling.  Skin: Negative.   Neurological: Negative.     Allergies as of 02/07/2017      Reactions   Bee Venom Swelling      Medication List       Accurate as of 02/07/17 11:17 AM. Always use your most recent med list.          ALPRAZolam 0.5 MG tablet Commonly known as:  XANAX Take 1 tablet (0.5 mg total) by mouth at bedtime as needed. For sleep   escitalopram 10 MG tablet Commonly known as:  LEXAPRO Take 1 tablet (10 mg total) by mouth every evening.   FISH OIL PO Take 1 capsule by mouth daily.   naproxen 500 MG tablet Commonly known as:  NAPROSYN Take 1 tablet (500 mg total) by mouth 2 (two) times daily with a meal.          Objective:    BP 112/71   Pulse 82  Temp 97.6 F (36.4 C) (Oral)   Ht '5\' 5"'$  (1.651 m)   Wt 182 lb (82.6 kg)   BMI 30.29 kg/m   Allergies  Allergen Reactions  . Bee Venom Swelling    Wt Readings from Last 3 Encounters:  02/07/17 182 lb (82.6 kg)  12/23/16 177 lb (80.3 kg)  08/08/16 182 lb 9.6 oz (82.8 kg)    Physical Exam  Constitutional: She is oriented to person, place, and time. She appears well-developed and well-nourished.  HENT:  Head: Normocephalic and atraumatic.  Right Ear: Tympanic membrane, external ear and ear canal normal.  Left Ear: Tympanic membrane, external ear and ear canal normal.  Nose: Nose normal. No rhinorrhea.  Mouth/Throat: Oropharynx is clear and moist and mucous membranes are normal. No oropharyngeal exudate or posterior oropharyngeal erythema.  Eyes: Conjunctivae and EOM are  normal. Pupils are equal, round, and reactive to light.  Neck: Normal range of motion. Neck supple.  Cardiovascular: Normal rate, regular rhythm, normal heart sounds and intact distal pulses.   Pulmonary/Chest: Effort normal and breath sounds normal.  Abdominal: Soft. Bowel sounds are normal.  Musculoskeletal:       Right shoulder: She exhibits tenderness, swelling and deformity.       Arms: Neurological: She is alert and oriented to person, place, and time. She has normal reflexes.  Skin: Skin is warm and dry. No rash noted.  Psychiatric: She has a normal mood and affect. Her behavior is normal. Judgment and thought content normal.        Assessment & Plan:   1. Well adult exam Recheck annual one year No pap needed due to history and age. Last pap 05/2015 normal Manual exam every 2 years.  2. Elbow pain, chronic, left - Ambulatory referral to Orthopedic Surgery - CMP14+EGFR - CBC with Differential/Platelet - Lipid panel - naproxen (NAPROSYN) 500 MG tablet; Take 1 tablet (500 mg total) by mouth 2 (two) times daily with a meal.  Dispense: 60 tablet; Refill: 1    Current Outpatient Prescriptions:  .  ALPRAZolam (XANAX) 0.5 MG tablet, Take 1 tablet (0.5 mg total) by mouth at bedtime as needed. For sleep, Disp: 30 tablet, Rfl: 5 .  escitalopram (LEXAPRO) 10 MG tablet, Take 1 tablet (10 mg total) by mouth every evening., Disp: 90 tablet, Rfl: 3 .  Omega-3 Fatty Acids (FISH OIL PO), Take 1 capsule by mouth daily., Disp: , Rfl:  .  naproxen (NAPROSYN) 500 MG tablet, Take 1 tablet (500 mg total) by mouth 2 (two) times daily with a meal., Disp: 60 tablet, Rfl: 1   Continue all other maintenance medications as listed above.  Follow up plan: Return if symptoms worsen or fail to improve.  Educational handout given for elbow pain  Terald Sleeper PA-C [facility] [address]  [place], [place] [address] [phone]   02/07/2017, 11:17 AM

## 2017-02-07 NOTE — Patient Instructions (Signed)
Elbow Bursitis  A bursa is a fluid-filled sac that covers and protects a joint. Bursitis is when the fluid-filled sac gets puffy and sore (inflamed). Elbow bursitis, also called olecranon bursitis, happens over your elbow. This may be caused by:  · Injury (acute trauma) to your elbow.  · Leaning on hard surfaces for long periods of time.  · Infection from an injury that breaks the skin near your elbow.  · A bone growth (spur) that forms at the tip of your elbow.  · A medical condition that causes inflammation in your body, such as:  ? Gout.  ? Rheumatoid arthritis.    Sometimes the cause is not known.  Follow these instructions at home:  · Take medicines only as told by your doctor.  · If you were prescribed an antibiotic medicine, finish all of it even if you start to feel better.  · If your bursitis is caused by an injury, rest your elbow and wear your bandage as told by your doctor. You may also apply ice to the injured area as told by your doctor:  ? Put ice in a plastic bag.  ? Place a towel between your skin and the bag.  ? Leave the ice on for 20 minutes, 2-3 times per day.  · Do not do any activities that cause pain to your elbow.  · Use elbow pads or wraps to cushion your elbow.  Contact a doctor if:  · You have a fever.  · Your symptoms do not get better with treatment.  · Your pain or swelling gets worse.  · Your pain or swelling goes away and then comes back.  · You have drainage of pus from the swollen area over your elbow.  This information is not intended to replace advice given to you by your health care provider. Make sure you discuss any questions you have with your health care provider.  Document Released: 05/01/2010 Document Revised: 04/18/2016 Document Reviewed: 07/20/2014  Elsevier Interactive Patient Education © 2017 Elsevier Inc.

## 2017-02-08 LAB — CMP14+EGFR
A/G RATIO: 1.6 (ref 1.2–2.2)
ALBUMIN: 4.1 g/dL (ref 3.6–4.8)
ALT: 18 IU/L (ref 0–32)
AST: 30 IU/L (ref 0–40)
Alkaline Phosphatase: 109 IU/L (ref 39–117)
BUN / CREAT RATIO: 14 (ref 12–28)
BUN: 10 mg/dL (ref 8–27)
Bilirubin Total: 0.3 mg/dL (ref 0.0–1.2)
CO2: 23 mmol/L (ref 18–29)
CREATININE: 0.72 mg/dL (ref 0.57–1.00)
Calcium: 9.1 mg/dL (ref 8.7–10.3)
Chloride: 103 mmol/L (ref 96–106)
GFR calc non Af Amer: 89 mL/min/{1.73_m2} (ref >59)
GFR, EST AFRICAN AMERICAN: 103 mL/min/{1.73_m2} (ref >59)
Globulin, Total: 2.5 g/dL (ref 1.5–4.5)
Glucose: 85 mg/dL (ref 65–99)
POTASSIUM: 4 mmol/L (ref 3.5–5.2)
SODIUM: 143 mmol/L (ref 134–144)
TOTAL PROTEIN: 6.6 g/dL (ref 6.0–8.5)

## 2017-02-08 LAB — CBC WITH DIFFERENTIAL/PLATELET
BASOS: 1 %
Basophils Absolute: 0.1 10*3/uL (ref 0.0–0.2)
EOS (ABSOLUTE): 0.2 10*3/uL (ref 0.0–0.4)
EOS: 4 %
HEMATOCRIT: 40 % (ref 34.0–46.6)
Hemoglobin: 12.8 g/dL (ref 11.1–15.9)
IMMATURE GRANS (ABS): 0 10*3/uL (ref 0.0–0.1)
Immature Granulocytes: 0 %
Lymphocytes Absolute: 2.1 10*3/uL (ref 0.7–3.1)
Lymphs: 39 %
MCH: 28.4 pg (ref 26.6–33.0)
MCHC: 32 g/dL (ref 31.5–35.7)
MCV: 89 fL (ref 79–97)
MONOS ABS: 0.2 10*3/uL (ref 0.1–0.9)
Monocytes: 4 %
NEUTROS ABS: 2.8 10*3/uL (ref 1.4–7.0)
NEUTROS PCT: 52 %
Platelets: 209 10*3/uL (ref 150–379)
RBC: 4.51 x10E6/uL (ref 3.77–5.28)
RDW: 14.3 % (ref 12.3–15.4)
WBC: 5.4 10*3/uL (ref 3.4–10.8)

## 2017-02-08 LAB — LIPID PANEL
CHOL/HDL RATIO: 7.5 ratio — AB (ref 0.0–4.4)
Cholesterol, Total: 293 mg/dL — ABNORMAL HIGH (ref 100–199)
HDL: 39 mg/dL — ABNORMAL LOW (ref >39)
LDL Calculated: 203 mg/dL — ABNORMAL HIGH (ref 0–99)
Triglycerides: 255 mg/dL — ABNORMAL HIGH (ref 0–149)
VLDL CHOLESTEROL CAL: 51 mg/dL — AB (ref 5–40)

## 2017-02-13 ENCOUNTER — Other Ambulatory Visit: Payer: Self-pay | Admitting: *Deleted

## 2017-02-13 DIAGNOSIS — E785 Hyperlipidemia, unspecified: Secondary | ICD-10-CM

## 2017-02-13 MED ORDER — ATORVASTATIN CALCIUM 20 MG PO TABS: 20.0000 mg | ORAL_TABLET | Freq: Every day | ORAL | 1 refills | Status: DC

## 2017-02-24 ENCOUNTER — Telehealth: Payer: Self-pay | Admitting: Physician Assistant

## 2017-02-24 NOTE — Telephone Encounter (Signed)
Pt has had myalgia's since starting lipitor. She has also had a rash from her ankles up to her knees since then, 2 wks now.  Rash, no blisters. Has used epson salt & baking soda which eases some Hydrocortisone, benadry & poison ivy cream States not flea bites or poison ivy Please advise

## 2017-02-24 NOTE — Telephone Encounter (Signed)
Pt aware to DC Lipitor And to be seen for her rash

## 2017-02-24 NOTE — Telephone Encounter (Signed)
Covering for PCP.   DC lipitpor. It would be helpful to see her and document findings as well as check labs.   Laroy Apple, MD [facility] 02/24/2017, 5:19 PM

## 2017-02-25 ENCOUNTER — Encounter: Payer: Self-pay | Admitting: Family Medicine

## 2017-02-25 ENCOUNTER — Ambulatory Visit (INDEPENDENT_AMBULATORY_CARE_PROVIDER_SITE_OTHER): Payer: BC Managed Care – PPO | Admitting: Family Medicine

## 2017-02-25 VITALS — BP 121/69 | HR 90 | Temp 96.8°F | Ht 65.0 in | Wt 178.8 lb

## 2017-02-25 DIAGNOSIS — R634 Abnormal weight loss: Secondary | ICD-10-CM | POA: Diagnosis not present

## 2017-02-25 DIAGNOSIS — Z789 Other specified health status: Secondary | ICD-10-CM

## 2017-02-25 DIAGNOSIS — R21 Rash and other nonspecific skin eruption: Secondary | ICD-10-CM

## 2017-02-25 MED ORDER — TRIAMCINOLONE ACETONIDE 0.5 % EX OINT: 1.0000 "application " | TOPICAL_OINTMENT | Freq: Two times a day (BID) | CUTANEOUS | 0 refills | Status: DC

## 2017-02-25 NOTE — Progress Notes (Addendum)
   HPI  Patient presents today with rash and muscle soreness.  Patient developed 2-3 weeks of muscle soreness and rash after starting Lipitor, she's had improvement since stopping Lipitor.  The rash is very itchy in bilateral lower extremities.  States at times it appears that she has bruises on the legs.  Is concerned about some weight loss, she's had 14 pound weight loss since December without attempt at losing weight. However she has had this illness and acute illness in December.   PMH: Smoking status noted ROS: Per HPI  Objective: BP 121/69   Pulse 90   Temp (!) 96.8 F (36 C) (Oral)   Ht '5\' 5"'$  (1.651 m)   Wt 178 lb 12.8 oz (81.1 kg)   BMI 29.75 kg/m  Gen: NAD, alert, cooperative with exam HEENT: NCAT CV: RRR, good S1/S2, no murmur Resp: CTABL, no wheezes, non-labored Ext: No edema, warm Neuro: Alert and oriented, No gross deficits Skin: Tender 20 a proximal 1 cm in diameter roughly circular slightly erythematous lesions on the bilateral lower extremities extending to the midcalf from the ankles bilaterally.  . Vitals - 1 value per visit 02/25/2017 02/07/2017 12/23/2016 08/08/2016 07/11/2015  Weight (lb) 178.8 182 177 182.6 175    Assessment and plan:  # Intolerance Patient with muscle pain and stiffness along with development of rash after starting statin, all are improving after stopping 3 days ago. Given Kenalog ointment for rash. DC statins for now, may like to retry pravastatin or livalo labs  # Rash Possibly drug reaction, also possibly in different type of rash Treat with steroid ointment for now, monitor for resolution. Follow-up as needed   Weight loss Discussed with patient, objectively this is not concerning. Her weight on our exams from the prior to Christmas and after have not changed very significantly. Encouraged colonoscopy, patient will consider    Orders Placed This Encounter  Procedures  . CK  . CMP14+EGFR  . CBC with  Differential/Platelet    Meds ordered this encounter  Medications  . triamcinolone ointment (KENALOG) 0.5 %    Sig: Apply 1 application topically 2 (two) times daily.    Dispense:  30 g    Refill:  Desha, MD [facility] 02/25/2017, 12:24 PM

## 2017-02-25 NOTE — Patient Instructions (Addendum)
Great to meet you!  Try the ointment on your rash, we will call with labs within 1 week.   Please plan to come back to See [facility] in 1-2 month sto discuss alternatives to lipitor.

## 2017-02-25 NOTE — Addendum Note (Signed)
Addended by: Timmothy Euler on: 02/25/2017 12:26 PM   Modules accepted: Level of Service

## 2017-02-26 LAB — CBC WITH DIFFERENTIAL/PLATELET
Basophils Absolute: 0.1 10*3/uL (ref 0.0–0.2)
Basos: 1 %
EOS (ABSOLUTE): 0.3 10*3/uL (ref 0.0–0.4)
Eos: 5 %
Hematocrit: 37.5 % (ref 34.0–46.6)
Hemoglobin: 12.7 g/dL (ref 11.1–15.9)
IMMATURE GRANS (ABS): 0 10*3/uL (ref 0.0–0.1)
Immature Granulocytes: 0 %
Lymphocytes Absolute: 2.3 10*3/uL (ref 0.7–3.1)
Lymphs: 41 %
MCH: 29.1 pg (ref 26.6–33.0)
MCHC: 33.9 g/dL (ref 31.5–35.7)
MCV: 86 fL (ref 79–97)
Monocytes Absolute: 0.3 10*3/uL (ref 0.1–0.9)
Monocytes: 6 %
NEUTROS ABS: 2.5 10*3/uL (ref 1.4–7.0)
Neutrophils: 47 %
PLATELETS: 238 10*3/uL (ref 150–379)
RBC: 4.36 x10E6/uL (ref 3.77–5.28)
RDW: 13.9 % (ref 12.3–15.4)
WBC: 5.5 10*3/uL (ref 3.4–10.8)

## 2017-02-26 LAB — CMP14+EGFR
ALT: 20 IU/L (ref 0–32)
AST: 26 IU/L (ref 0–40)
Albumin/Globulin Ratio: 1.5 (ref 1.2–2.2)
Albumin: 4.2 g/dL (ref 3.6–4.8)
Alkaline Phosphatase: 127 IU/L — ABNORMAL HIGH (ref 39–117)
BILIRUBIN TOTAL: 0.4 mg/dL (ref 0.0–1.2)
BUN/Creatinine Ratio: 15 (ref 12–28)
BUN: 10 mg/dL (ref 8–27)
CALCIUM: 9.6 mg/dL (ref 8.7–10.3)
CHLORIDE: 101 mmol/L (ref 96–106)
CO2: 22 mmol/L (ref 18–29)
Creatinine, Ser: 0.68 mg/dL (ref 0.57–1.00)
GFR, EST AFRICAN AMERICAN: 108 mL/min/{1.73_m2} (ref >59)
GFR, EST NON AFRICAN AMERICAN: 93 mL/min/{1.73_m2} (ref >59)
Globulin, Total: 2.8 g/dL (ref 1.5–4.5)
Glucose: 84 mg/dL (ref 65–99)
Potassium: 4.1 mmol/L (ref 3.5–5.2)
Sodium: 144 mmol/L (ref 134–144)
TOTAL PROTEIN: 7 g/dL (ref 6.0–8.5)

## 2017-02-26 LAB — CK: CK TOTAL: 51 U/L (ref 24–173)

## 2017-07-15 ENCOUNTER — Ambulatory Visit (INDEPENDENT_AMBULATORY_CARE_PROVIDER_SITE_OTHER): Payer: BC Managed Care – PPO | Admitting: Family

## 2017-07-15 ENCOUNTER — Encounter: Payer: Self-pay | Admitting: Family

## 2017-07-15 VITALS — BP 131/85 | HR 84 | Temp 97.6°F | Ht 65.0 in | Wt 178.4 lb

## 2017-07-15 DIAGNOSIS — R5383 Other fatigue: Secondary | ICD-10-CM

## 2017-07-15 DIAGNOSIS — R079 Chest pain, unspecified: Secondary | ICD-10-CM

## 2017-07-15 DIAGNOSIS — K219 Gastro-esophageal reflux disease without esophagitis: Secondary | ICD-10-CM

## 2017-07-15 MED ORDER — OMEPRAZOLE 20 MG PO CPDR: 20.0000 mg | DELAYED_RELEASE_CAPSULE | Freq: Every day | ORAL | 1 refills | Status: DC

## 2017-07-15 NOTE — Progress Notes (Signed)
   Subjective:    Patient ID: [name], female    DOB: 12-Jun-1951, 66 y.o.   MRN: [id number]  Headache   Associated symptoms include back pain. Pertinent negatives include no coughing, dizziness or vomiting.  Chest Pain   This is a new problem. The current episode started in the past 7 days. The onset quality is gradual. The problem occurs intermittently. The problem has been waxing and waning. The pain is at a severity of 3/10. The pain is mild. The quality of the pain is described as tightness and dull. The pain radiates to the mid back. Associated symptoms include back pain, headaches and malaise/fatigue. Pertinent negatives include no cough, diaphoresis, dizziness, irregular heartbeat, shortness of breath, syncope or vomiting. The pain is aggravated by nothing. She has tried antacids for the symptoms. The treatment provided mild relief.   Mother had CHF and CVA.    Review of Systems  Constitutional: Positive for malaise/fatigue. Negative for diaphoresis.  Respiratory: Negative for cough and shortness of breath.   Cardiovascular: Positive for chest pain. Negative for syncope.  Gastrointestinal: Negative for vomiting.  Musculoskeletal: Positive for back pain.  Neurological: Positive for headaches. Negative for dizziness.  All other systems reviewed and are negative.      Objective:   Physical Exam  Constitutional: She is oriented to person, place, and time. She appears well-developed and well-nourished. No distress.  HENT:  Head: Normocephalic.  Eyes: Pupils are equal, round, and reactive to light.  Neck: Normal range of motion. Neck supple. No thyromegaly present.  Cardiovascular: Normal rate, regular rhythm, normal heart sounds and intact distal pulses.   No murmur heard. Pulmonary/Chest: Effort normal and breath sounds normal. No respiratory distress. She has no wheezes.  Abdominal: Soft. Bowel sounds are normal. She exhibits no distension. There is no tenderness.    Musculoskeletal: Normal range of motion. She exhibits no edema or tenderness.  Neurological: She is alert and oriented to person, place, and time.  Skin: Skin is warm and dry.  Psychiatric: She has a normal mood and affect. Her behavior is normal. Judgment and thought content normal.  Vitals reviewed.    BP 131/85   Pulse 84   Temp 97.6 F (36.4 C) (Oral)   Ht 5\' 5"  (1.651 m)   Wt 178 lb 6.4 oz (80.9 kg)   BMI 29.69 kg/m      Assessment & Plan:  1. Chest pain at rest - EKG 12-Lead - Ambulatory referral to Cardiology  2. Gastroesophageal reflux disease without esophagitis - omeprazole (PRILOSEC) 20 MG capsule; Take 1 capsule (20 mg total) by mouth daily.  Dispense: 90 capsule; Refill: 1  3. Fatigue, unspecified type - Ambulatory referral to Cardiology    Start Omeprazole  -Diet discussed- Avoid fried, spicy, citrus foods, caffeine and alcohol -Do not eat 2-3 hours before bedtime -Encouraged small frequent meals -Avoid NSAID's Will do referral to Cardiologists   Evelina Dun, FNP

## 2017-07-15 NOTE — Patient Instructions (Signed)
Nonspecific Chest Pain °Chest pain can be caused by many different conditions. There is always a chance that your pain could be related to something serious, such as a heart attack or a blood clot in your lungs. Chest pain can also be caused by conditions that are not life-threatening. If you have chest pain, it is very important to follow up with your health care provider. °What are the causes? °Causes of this condition include: °· Heartburn. °· Pneumonia or bronchitis. °· Anxiety or stress. °· Inflammation around your heart (pericarditis) or lung (pleuritis or pleurisy). °· A blood clot in your lung. °· A collapsed lung (pneumothorax). This can develop suddenly on its own (spontaneous pneumothorax) or from trauma to the chest. °· Shingles infection (varicella-zoster virus). °· Heart attack. °· Damage to the bones, muscles, and cartilage that make up your chest wall. This can include: °? Bruised bones due to injury. °? Strained muscles or cartilage due to frequent or repeated coughing or overwork. °? Fracture to one or more ribs. °? Sore cartilage due to inflammation (costochondritis). ° °What increases the risk? °Risk factors for this condition may include: °· Activities that increase your risk for trauma or injury to your chest. °· Respiratory infections or conditions that cause frequent coughing. °· Medical conditions or overeating that can cause heartburn. °· Heart disease or family history of heart disease. °· Conditions or health behaviors that increase your risk of developing a blood clot. °· Having had chicken pox (varicella zoster). ° °What are the signs or symptoms? °Chest pain can feel like: °· Burning or tingling on the surface of your chest or deep in your chest. °· Crushing, pressure, aching, or squeezing pain. °· Dull or sharp pain that is worse when you move, cough, or take a deep breath. °· Pain that is also felt in your back, neck, shoulder, or arm, or pain that spreads to any of these  areas. ° °Your chest pain may come and go, or it may stay constant. °How is this diagnosed? °Lab tests or other studies may be needed to find the cause of your pain. Your health care provider may have you take a test called an ECG (electrocardiogram). An ECG records your heartbeat patterns at the time the test is performed. You may also have other tests, such as: °· Transthoracic echocardiogram (TTE). In this test, sound waves are used to create a picture of the heart structures and to look at how blood flows through your heart. °· Transesophageal echocardiogram (TEE). This is a more advanced imaging test that takes images from inside your body. It allows your health care provider to see your heart in finer detail. °· Cardiac monitoring. This allows your health care provider to monitor your heart rate and rhythm in real time. °· Holter monitor. This is a portable device that records your heartbeat and can help to diagnose abnormal heartbeats. It allows your health care provider to track your heart activity for several days, if needed. °· Stress tests. These can be done through exercise or by taking medicine that makes your heart beat more quickly. °· Blood tests. °· Other imaging tests. ° °How is this treated? °Treatment depends on what is causing your chest pain. Treatment may include: °· Medicines. These may include: °? Acid blockers for heartburn. °? Anti-inflammatory medicine. °? Pain medicine for inflammatory conditions. °? Antibiotic medicine, if an infection is present. °? Medicines to dissolve blood clots. °? Medicines to treat coronary artery disease (CAD). °· Supportive care for conditions that   do not require medicines. This may include: °? Resting. °? Applying heat or cold packs to injured areas. °? Limiting activities until pain decreases. ° °Follow these instructions at home: °Medicines °· If you were prescribed an antibiotic, take it as told by your health care provider. Do not stop taking the  antibiotic even if you start to feel better. °· Take over-the-counter and prescription medicines only as told by your health care provider. °Lifestyle °· Do not use any products that contain nicotine or tobacco, such as cigarettes and e-cigarettes. If you need help quitting, ask your health care provider. °· Do not drink alcohol. °· Make lifestyle changes as directed by your health care provider. These may include: °? Getting regular exercise. Ask your health care provider to suggest some activities that are safe for you. °? Eating a heart-healthy diet. A registered dietitian can help you to learn healthy eating options. °? Maintaining a healthy weight. °? Managing diabetes, if necessary. °? Reducing stress, such as with yoga or relaxation techniques. °General instructions °· Avoid any activities that bring on chest pain. °· If heartburn is the cause for your chest pain, raise (elevate) the head of your bed about 6 inches (15 cm) by putting blocks under the legs. Sleeping with more pillows does not effectively relieve heartburn because it only changes the position of your head. °· Keep all follow-up visits as told by your health care provider. This is important. This includes any further testing if your chest pain does not go away. °Contact a health care provider if: °· Your chest pain does not go away. °· You have a rash with blisters on your chest. °· You have a fever. °· You have chills. °Get help right away if: °· Your chest pain is worse. °· You have a cough that gets worse, or you cough up blood. °· You have severe pain in your abdomen. °· You have severe weakness. °· You faint. °· You have sudden, unexplained chest discomfort. °· You have sudden, unexplained discomfort in your arms, back, neck, or jaw. °· You have shortness of breath at any time. °· You suddenly start to sweat, or your skin gets clammy. °· You feel nauseous or you vomit. °· You suddenly feel light-headed or dizzy. °· Your heart begins to beat  quickly, or it feels like it is skipping beats. °These symptoms may represent a serious problem that is an emergency. Do not wait to see if the symptoms will go away. Get medical help right away. Call your local emergency services (911 in the [place] Do not drive yourself to the hospital. °This information is not intended to replace advice given to you by your health care provider. Make sure you discuss any questions you have with your health care provider. °Document Released: 08/21/2005 Document Revised: 08/05/2016 Document Reviewed: 08/05/2016 °Elsevier Interactive Patient Education © 2017 Elsevier Inc. ° °

## 2017-07-16 ENCOUNTER — Other Ambulatory Visit: Payer: Self-pay | Admitting: Physician Assistant

## 2017-07-16 DIAGNOSIS — Z1231 Encounter for screening mammogram for malignant neoplasm of breast: Secondary | ICD-10-CM

## 2017-08-15 ENCOUNTER — Ambulatory Visit (INDEPENDENT_AMBULATORY_CARE_PROVIDER_SITE_OTHER): Payer: BC Managed Care – PPO | Admitting: Cardiology

## 2017-08-15 ENCOUNTER — Encounter: Payer: Self-pay | Admitting: Cardiology

## 2017-08-15 DIAGNOSIS — R079 Chest pain, unspecified: Secondary | ICD-10-CM | POA: Diagnosis not present

## 2017-08-15 NOTE — Patient Instructions (Signed)
Medication Instructions:  Your physician recommends that you continue on your current medications as directed. Please refer to the Current Medication list given to you today.   Labwork: NONE  Testing/Procedures: Your physician has requested that you have a lexiscan myoview. For further information please visit [email or web address]. Please follow instruction sheet, as given.    Follow-Up: Your physician recommends that you schedule a follow-up appointment in: AS NEEDED    Any Other Special Instructions Will Be Listed Below (If Applicable).     If you need a refill on your cardiac medications before your next appointment, please call your pharmacy.   

## 2017-08-15 NOTE — Progress Notes (Signed)
Clinical Summary [name] is a 66 y.o.female seen as new consult, referred by PA Jones for chest pain  1. Chest pain - started about 3-4 months ago. Sharp pain mid to left chest, can radiate into shoulder blade. Severe pain. Can occur at rest or with activity. Can have some mild fluttering. Pain lasts just a second or 2, can have a dull pain that lasts up to 15 minutes - symptoms 1-2 times a week - highest level of activity is housework/yardwork without SOB  CAD risk factors: +HL   - double knee replacement  PMH 1. Hyperlipidemia   Allergies  Allergen Reactions  . Bee Venom Swelling     Current Outpatient Prescriptions  Medication Sig Dispense Refill  . ALPRAZolam (XANAX) 0.5 MG tablet Take 1 tablet (0.5 mg total) by mouth at bedtime as needed. For sleep 30 tablet 5  . escitalopram (LEXAPRO) 10 MG tablet Take 1 tablet (10 mg total) by mouth every evening. 90 tablet 3  . Omega-3 Fatty Acids (FISH OIL PO) Take 1 capsule by mouth daily.    Marland Kitchen omeprazole (PRILOSEC) 20 MG capsule Take 1 capsule (20 mg total) by mouth daily. 90 capsule 1  . triamcinolone ointment (KENALOG) 0.5 % Apply 1 application topically 2 (two) times daily. 30 g 0   No current facility-administered medications for this visit.      Past Surgical History:  Procedure Laterality Date  . CESAREAN SECTION    . KNEE SURGERY     Bilaterally     Allergies  Allergen Reactions  . Bee Venom Swelling      Family History  Problem Relation Age of Onset  . Heart disease Mother   . Hypertension Mother   . Alcohol abuse Father      Social History Ms. Cooksey reports that she has never smoked. She has never used smokeless tobacco. Ms. Spano reports that she does not drink alcohol.   Review of Systems CONSTITUTIONAL: No weight loss, fever, chills, weakness or fatigue.  HEENT: Eyes: No visual loss, blurred vision, double vision or yellow sclerae.No hearing loss, sneezing, congestion, runny nose or sore  throat.  SKIN: No rash or itching.  CARDIOVASCULAR: per hpi RESPIRATORY: per hpi  GASTROINTESTINAL: No anorexia, nausea, vomiting or diarrhea. No abdominal pain or blood.  GENITOURINARY: No burning on urination, no polyuria NEUROLOGICAL: No headache, dizziness, syncope, paralysis, ataxia, numbness or tingling in the extremities. No change in bowel or bladder control.  MUSCULOSKELETAL: No muscle, back pain, joint pain or stiffness.  LYMPHATICS: No enlarged nodes. No history of splenectomy.  PSYCHIATRIC: No history of depression or anxiety.  ENDOCRINOLOGIC: No reports of sweating, cold or heat intolerance. No polyuria or polydipsia.  Marland Kitchen   Physical Examination Vitals:   08/15/17 1330 08/15/17 1334  BP: 120/70 120/70  Pulse: 91   SpO2: 97%    Vitals:   08/15/17 1330  Weight: 181 lb (82.1 kg)  Height: 5\' 5"  (1.651 m)    Gen: resting comfortably, no acute distress HEENT: no scleral icterus, pupils equal round and reactive, no palptable cervical adenopathy,  CV: RRR, no m/r/g, no jvd Resp: Clear to auscultation bilaterally GI: abdomen is soft, non-tender, non-distended, normal bowel sounds, no hepatosplenomegaly MSK: extremities are warm, no edema.  Skin: warm, no rash Neuro:  no focal deficits Psych: appropriate affect     Assessment and Plan  1. Chest pain - we will plan for a lexisacn MPI to further evaluate for possible ischemic heart disease  as the cause of her chest pain    F/u pending test results      Arnoldo Lenis, M.D.

## 2017-08-21 ENCOUNTER — Ambulatory Visit
Admission: RE | Admit: 2017-08-21 | Discharge: 2017-08-21 | Disposition: A | Discharge status: Home / Self Care | Payer: BC Managed Care – PPO | Source: Ambulatory Visit | Attending: Physician Assistant | Admitting: Physician Assistant

## 2017-08-21 DIAGNOSIS — Z1231 Encounter for screening mammogram for malignant neoplasm of breast: Secondary | ICD-10-CM

## 2017-08-22 ENCOUNTER — Encounter (HOSPITAL_COMMUNITY): Payer: Self-pay

## 2017-08-22 ENCOUNTER — Encounter (HOSPITAL_BASED_OUTPATIENT_CLINIC_OR_DEPARTMENT_OTHER)
Admission: RE | Admit: 2017-08-22 | Discharge: 2017-08-22 | Disposition: A | Discharge status: Home / Self Care | Payer: BC Managed Care – PPO | Source: Ambulatory Visit | Attending: Cardiology | Admitting: Cardiology

## 2017-08-22 ENCOUNTER — Encounter (HOSPITAL_COMMUNITY)
Admission: RE | Admit: 2017-08-22 | Discharge: 2017-08-22 | Disposition: A | Discharge status: Home / Self Care | Payer: BC Managed Care – PPO | Source: Ambulatory Visit | Attending: Cardiology | Admitting: Cardiology

## 2017-08-22 DIAGNOSIS — R079 Chest pain, unspecified: Secondary | ICD-10-CM | POA: Diagnosis present

## 2017-08-22 LAB — NM MYOCAR MULTI W/SPECT W/WALL MOTION / EF
CHL CUP NUCLEAR SDS: 1
CHL CUP NUCLEAR SSS: 1
LV dias vol: 55 mL (ref 46–106)
LV sys vol: 13 mL
NUC STRESS TID: 1.07
Peak HR: 120 {beats}/min
RATE: 0.5
Rest HR: 85 {beats}/min
SRS: 0

## 2017-08-22 MED ORDER — REGADENOSON 0.4 MG/5ML IV SOLN
INTRAVENOUS | Status: AC
  Administered 2017-08-22: 0.4 mg via INTRAVENOUS
  Filled 2017-08-22: qty 5

## 2017-08-22 MED ORDER — TECHNETIUM TC 99M TETROFOSMIN IV KIT
30.0000 | PACK | Freq: Once | INTRAVENOUS | Status: AC | PRN
  Administered 2017-08-22: 30 via INTRAVENOUS

## 2017-08-22 MED ORDER — TECHNETIUM TC 99M TETROFOSMIN IV KIT
10.0000 | PACK | Freq: Once | INTRAVENOUS | Status: AC | PRN
  Administered 2017-08-22: 10 via INTRAVENOUS

## 2017-08-22 MED ORDER — SODIUM CHLORIDE 0.9% FLUSH
INTRAVENOUS | Status: AC
  Administered 2017-08-22: 10 mL via INTRAVENOUS
  Filled 2017-08-22: qty 10

## 2017-08-22 MED ORDER — SODIUM CHLORIDE 0.9% FLUSH
INTRAVENOUS | Status: AC
  Filled 2017-08-22: qty 180

## 2017-08-25 ENCOUNTER — Telehealth: Payer: Self-pay

## 2017-08-25 NOTE — Telephone Encounter (Signed)
-----   Message from Arnoldo Lenis, MD sent at 08/25/2017  2:42 PM EDT ----- Stress test is normal, no evidence of any blockages. She needs to f/u with her pcp to discuss noncardiac causes of her pain. F/u with [place] in 3 months   Zandra Abts MD

## 2017-08-25 NOTE — Telephone Encounter (Signed)
Called pt, no answer- left message for pt to return call.  

## 2017-10-01 ENCOUNTER — Encounter: Payer: Self-pay | Admitting: Nurse Practitioner

## 2017-10-01 ENCOUNTER — Ambulatory Visit (INDEPENDENT_AMBULATORY_CARE_PROVIDER_SITE_OTHER): Payer: BC Managed Care – PPO | Admitting: Nurse Practitioner

## 2017-10-01 VITALS — BP 137/84 | HR 89 | Temp 98.0°F | Ht 65.0 in | Wt 183.0 lb

## 2017-10-01 DIAGNOSIS — K219 Gastro-esophageal reflux disease without esophagitis: Secondary | ICD-10-CM

## 2017-10-01 DIAGNOSIS — J029 Acute pharyngitis, unspecified: Secondary | ICD-10-CM

## 2017-10-01 DIAGNOSIS — B370 Candidal stomatitis: Secondary | ICD-10-CM

## 2017-10-01 MED ORDER — PANTOPRAZOLE SODIUM 40 MG PO TBEC: 40.0000 mg | DELAYED_RELEASE_TABLET | Freq: Every day | ORAL | 3 refills | Status: DC

## 2017-10-01 MED ORDER — MAGIC MOUTHWASH: 5.0000 mL | Freq: Three times a day (TID) | ORAL | 0 refills | Status: DC

## 2017-10-01 NOTE — Patient Instructions (Signed)
Oral Thrush, Adult Oral thrush is an infection in your mouth and throat. It causes white patches on your tongue and in your mouth. Follow these instructions at home: Helping with soreness  To lessen your pain: ? Drink cold liquids, like water and iced tea. ? Eat frozen ice pops or frozen juices. ? Eat foods that are easy to swallow, like gelatin and ice cream. ? Drink from a straw if the patches in your mouth are painful. General instructions   Take or use over-the-counter and prescription medicines only as told by your doctor. Medicine for oral thrush may be something to swallow, or it may be something to put on the infected area.  Eat plain yogurt that has live cultures in it. Read the label to make sure.  If you wear dentures: ? Take out your dentures before you go to bed. ? Brush them well. ? Soak them in a denture cleaner.  Rinse your mouth with warm salt-water many times a day. To make the salt-water mixture, completely dissolve 1/2-1 teaspoon of salt in 1 cup of warm water. Contact a doctor if:  Your problems are getting worse.  Your problems do not get better in less than 7 days with treatment.  Your infection is spreading. This may show as white patches on the skin outside of your mouth.  You are nursing your baby and you have redness and pain in the nipples. This information is not intended to replace advice given to you by your health care provider. Make sure you discuss any questions you have with your health care provider. Document Released: 02/05/2010 Document Revised: 08/05/2016 Document Reviewed: 08/05/2016 Elsevier Interactive Patient Education  2017 Elsevier Inc.  

## 2017-10-01 NOTE — Progress Notes (Signed)
   Subjective:    Patient ID: [name], female    DOB: Mar 06, 1951, 65 y.o.   MRN: [id number]  HPI Patient comes in today c/o sore throat that started over a month ago. She dnies running a fever. Some days it is worse then others. No trouble swallowing. Pain seems to be mainly on the left side. Patient has history of gastric reflux but she has not been taking her omeprazole.    Review of Systems  Constitutional: Negative for appetite change, chills and fever.  HENT: Positive for sore throat. Negative for congestion, ear pain, postnasal drip, rhinorrhea, sinus pain and trouble swallowing.   Respiratory: Negative for cough.   Cardiovascular: Negative.   Genitourinary: Negative.   Neurological: Negative.   Psychiatric/Behavioral: Negative.   All other systems reviewed and are negative.      Objective:   Physical Exam  Constitutional: She is oriented to person, place, and time. She appears well-developed and well-nourished. No distress.  HENT:  Right Ear: Hearing, tympanic membrane, external ear and ear canal normal.  Left Ear: Hearing, tympanic membrane, external ear and ear canal normal.  Nose: Nose normal.  Mouth/Throat: Posterior oropharyngeal erythema (very mild) present. Tonsillar abscesses: tonsil stones on left.  White patchy areas on post oral pharynx  Neck: Normal range of motion. Neck supple.  Cardiovascular: Normal rate and regular rhythm.  Pulmonary/Chest: Effort normal and breath sounds normal.  Neurological: She is alert and oriented to person, place, and time.  Skin: Skin is warm.  Psychiatric: She has a normal mood and affect. Her behavior is normal. Judgment and thought content normal.   BP 137/84   Pulse 89   Temp 98 F (36.7 C) (Oral)   Ht 5\' 5"  (1.651 m)   Wt 183 lb (83 kg)   BMI 30.45 kg/m      Assessment & Plan:  1. Pharyngitis, unspecified etiology - magic mouthwash SOLN; Take 5 mLs 3 (three) times daily by mouth. Swish and swallow  Dispense: 150  mL; Refill: 0  2. Thrush Force fluids - magic mouthwash SOLN; Take 5 mLs 3 (three) times daily by mouth. Swish and swallow  Dispense: 150 mL; Refill: 0  3. Gastroesophageal reflux disease without esophagitis Avoid spicy and fatty foods - pantoprazole (PROTONIX) 40 MG tablet; Take 1 tablet (40 mg total) daily by mouth.  Dispense: 30 tablet; Refill: [place], FNP

## 2017-10-24 ENCOUNTER — Telehealth: Payer: Self-pay | Admitting: Physician Assistant

## 2017-10-24 MED ORDER — CEFDINIR 300 MG PO CAPS: 300.0000 mg | ORAL_CAPSULE | Freq: Two times a day (BID) | ORAL | 0 refills | Status: DC

## 2017-10-24 MED ORDER — PREDNISONE 10 MG (21) PO TBPK
ORAL_TABLET | ORAL | 0 refills | Status: DC
Start: 2017-10-24 — End: 2017-11-11

## 2017-10-24 NOTE — Telephone Encounter (Signed)
Patient aware.

## 2017-11-11 ENCOUNTER — Ambulatory Visit: Payer: BC Managed Care – PPO | Admitting: Physician Assistant

## 2017-11-11 ENCOUNTER — Encounter: Payer: Self-pay | Admitting: Physician Assistant

## 2017-11-11 VITALS — BP 123/75 | HR 83 | Temp 97.9°F | Ht 65.0 in | Wt 180.8 lb

## 2017-11-11 DIAGNOSIS — J40 Bronchitis, not specified as acute or chronic: Secondary | ICD-10-CM | POA: Diagnosis not present

## 2017-11-11 MED ORDER — HYDROCODONE-HOMATROPINE 5-1.5 MG/5ML PO SYRP: 5.0000 mL | ORAL_SOLUTION | Freq: Four times a day (QID) | ORAL | 0 refills | Status: DC | PRN

## 2017-11-11 MED ORDER — CEFDINIR 300 MG PO CAPS: 300.0000 mg | ORAL_CAPSULE | Freq: Two times a day (BID) | ORAL | 0 refills | Status: DC

## 2017-11-11 MED ORDER — ALPRAZOLAM 0.5 MG PO TABS: 0.5000 mg | ORAL_TABLET | Freq: Every evening | ORAL | 5 refills | Status: DC | PRN

## 2017-11-11 MED ORDER — METHYLPREDNISOLONE ACETATE 80 MG/ML IJ SUSP
80.0000 mg | Freq: Once | INTRAMUSCULAR | Status: AC
  Administered 2017-11-11: 80 mg via INTRAMUSCULAR

## 2017-11-11 MED ORDER — ALBUTEROL SULFATE HFA 108 (90 BASE) MCG/ACT IN AERS: 2.0000 | INHALATION_SPRAY | RESPIRATORY_TRACT | 5 refills | Status: DC | PRN

## 2017-11-11 NOTE — Patient Instructions (Signed)
In a few days you may receive a survey in the mail or online from [employer] regarding your visit with us today. Please take a moment to fill this out. Your feedback is very important to our whole office. It can help us better understand your needs as well as improve your experience and satisfaction. Thank you for taking your time to complete it. We care about you.  Gabrial Poppell, PA-C  

## 2017-11-11 NOTE — Progress Notes (Signed)
BP 123/75   Pulse 83   Temp 97.9 F (36.6 C) (Oral)   Ht 5\' 5"  (1.651 m)   Wt 180 lb 12.8 oz (82 kg)   BMI 30.09 kg/m    Subjective:    Patient ID: [name], female    DOB: [birth year], 66 y.o.   MRN: [id number]  HPI: [name] is a 66 y.o. female presenting on 11/11/2017 for Cough (since November)  Patient with several days of progressing upper respiratory and bronchial symptoms. Initially there was more upper respiratory congestion. This progressed to having significant cough that is productive throughout the day and severe at night. There is lots of wheezing especially after coughing. Sometimes there is slight dyspnea on exertion. It is productive mucus that is yellow in color. Denies any blood.   Relevant past medical, surgical, family and social history reviewed and updated as indicated. Allergies and medications reviewed and updated.  History reviewed. No pertinent past medical history.  Past Surgical History:  Procedure Laterality Date  . CESAREAN SECTION    . KNEE SURGERY     Bilaterally    Review of Systems  Constitutional: Positive for chills, fatigue and fever. Negative for activity change and appetite change.  HENT: Positive for congestion, postnasal drip and sore throat.   Eyes: Negative.   Respiratory: Positive for cough, shortness of breath and wheezing.   Cardiovascular: Negative.  Negative for chest pain, palpitations and leg swelling.  Gastrointestinal: Negative.   Genitourinary: Negative.   Musculoskeletal: Negative.   Skin: Negative.   Neurological: Positive for headaches.    Allergies as of 11/11/2017      Reactions   Bee Venom Swelling      Medication List        Accurate as of 11/11/17 11:27 AM. Always use your most recent med list.          albuterol 108 (90 Base) MCG/ACT inhaler Commonly known as:  PROVENTIL HFA;VENTOLIN HFA Inhale 2 puffs into the lungs every 4 (four) hours as needed for wheezing or shortness of breath.     ALPRAZolam 0.5 MG tablet Commonly known as:  XANAX Take 1 tablet (0.5 mg total) by mouth at bedtime as needed. For sleep   cefdinir 300 MG capsule Commonly known as:  OMNICEF Take 1 capsule (300 mg total) by mouth 2 (two) times daily. 1 po BID   escitalopram 10 MG tablet Commonly known as:  LEXAPRO Take 1 tablet (10 mg total) by mouth every evening.   FISH OIL PO Take 1 capsule by mouth daily.   HYDROcodone-homatropine 5-1.5 MG/5ML syrup Commonly known as:  HYCODAN Take 5-10 mLs by mouth every 6 (six) hours as needed.   magic mouthwash Soln Take 5 mLs 3 (three) times daily by mouth. Swish and swallow   omeprazole 20 MG capsule Commonly known as:  PRILOSEC Take 1 capsule (20 mg total) by mouth daily.   pantoprazole 40 MG tablet Commonly known as:  PROTONIX Take 1 tablet (40 mg total) daily by mouth.          Objective:    BP 123/75   Pulse 83   Temp 97.9 F (36.6 C) (Oral)   Ht 5\' 5"  (1.651 m)   Wt 180 lb 12.8 oz (82 kg)   BMI 30.09 kg/m   Allergies  Allergen Reactions  . Bee Venom Swelling    Physical Exam  Constitutional: She is oriented to person, place, and time. She appears well-developed and well-nourished.  HENT:  Head: Normocephalic and atraumatic.  Right Ear: There is drainage and tenderness.  Left Ear: There is drainage and tenderness.  Nose: Mucosal edema and rhinorrhea present. Right sinus exhibits no maxillary sinus tenderness and no frontal sinus tenderness. Left sinus exhibits no maxillary sinus tenderness and no frontal sinus tenderness.  Mouth/Throat: Oropharyngeal exudate and posterior oropharyngeal erythema present.  Eyes: Conjunctivae and EOM are normal. Pupils are equal, round, and reactive to light.  Neck: Normal range of motion. Neck supple.  Cardiovascular: Normal rate, regular rhythm, normal heart sounds and intact distal pulses.  Pulmonary/Chest: Effort normal. She has wheezes in the right upper field, the right middle field, the  right lower field, the left upper field, the left middle field and the left lower field.  Abdominal: Soft. Bowel sounds are normal.  Neurological: She is alert and oriented to person, place, and time. She has normal reflexes.  Skin: Skin is warm and dry. No rash noted.  Psychiatric: She has a normal mood and affect. Her behavior is normal. Judgment and thought content normal.  Nursing note and vitals reviewed.       Assessment & Plan:   1. Bronchitis - methylPREDNISolone acetate (DEPO-MEDROL) injection 80 mg    Current Outpatient Medications:  .  ALPRAZolam (XANAX) 0.5 MG tablet, Take 1 tablet (0.5 mg total) by mouth at bedtime as needed. For sleep, Disp: 30 tablet, Rfl: 5 .  escitalopram (LEXAPRO) 10 MG tablet, Take 1 tablet (10 mg total) by mouth every evening., Disp: 90 tablet, Rfl: 3 .  magic mouthwash SOLN, Take 5 mLs 3 (three) times daily by mouth. Swish and swallow, Disp: 150 mL, Rfl: 0 .  Omega-3 Fatty Acids (FISH OIL PO), Take 1 capsule by mouth daily., Disp: , Rfl:  .  omeprazole (PRILOSEC) 20 MG capsule, Take 1 capsule (20 mg total) by mouth daily., Disp: 90 capsule, Rfl: 1 .  pantoprazole (PROTONIX) 40 MG tablet, Take 1 tablet (40 mg total) daily by mouth., Disp: 30 tablet, Rfl: 3 .  albuterol (PROVENTIL HFA;VENTOLIN HFA) 108 (90 Base) MCG/ACT inhaler, Inhale 2 puffs into the lungs every 4 (four) hours as needed for wheezing or shortness of breath., Disp: 18 g, Rfl: 5 .  cefdinir (OMNICEF) 300 MG capsule, Take 1 capsule (300 mg total) by mouth 2 (two) times daily. 1 po BID, Disp: 20 capsule, Rfl: 0 .  HYDROcodone-homatropine (HYCODAN) 5-1.5 MG/5ML syrup, Take 5-10 mLs by mouth every 6 (six) hours as needed., Disp: 240 mL, Rfl: 0  Current Facility-Administered Medications:  .  methylPREDNISolone acetate (DEPO-MEDROL) injection 80 mg, 80 mg, Intramuscular, Once, Praveen Coia S, PA-C Continue all other maintenance medications as listed above.  Follow up plan: Return if  symptoms worsen or fail to improve.  Educational handout given for [place] PA-C [place] [address]  [address] [place], [place] [address] [phone]   11/11/2017, 11:27 AM

## 2018-01-22 ENCOUNTER — Encounter: Payer: Self-pay | Admitting: Family Medicine

## 2018-01-22 ENCOUNTER — Other Ambulatory Visit: Payer: Self-pay | Admitting: Family Medicine

## 2018-01-22 ENCOUNTER — Ambulatory Visit (INDEPENDENT_AMBULATORY_CARE_PROVIDER_SITE_OTHER): Payer: BC Managed Care – PPO

## 2018-01-22 ENCOUNTER — Ambulatory Visit: Payer: BC Managed Care – PPO | Admitting: Family Medicine

## 2018-01-22 VITALS — BP 133/77 | HR 97 | Temp 97.5°F | Ht 65.0 in | Wt 183.6 lb

## 2018-01-22 DIAGNOSIS — R05 Cough: Secondary | ICD-10-CM

## 2018-01-22 DIAGNOSIS — J189 Pneumonia, unspecified organism: Secondary | ICD-10-CM | POA: Diagnosis not present

## 2018-01-22 DIAGNOSIS — R059 Cough, unspecified: Secondary | ICD-10-CM

## 2018-01-22 MED ORDER — AMOXICILLIN-POT CLAVULANATE 875-125 MG PO TABS: 1.0000 | ORAL_TABLET | Freq: Two times a day (BID) | ORAL | 0 refills | Status: DC

## 2018-01-22 MED ORDER — AZITHROMYCIN 250 MG PO TABS: ORAL_TABLET | ORAL | 0 refills | Status: DC

## 2018-01-22 MED ORDER — PREDNISONE 10 MG PO TABS: ORAL_TABLET | ORAL | 0 refills | Status: DC

## 2018-01-22 NOTE — Progress Notes (Signed)
   HPI  Patient presents today cough.  Patient explains she has had cough persistently for about 3 months.  Over the last 2-3 days it has gotten worse and become more productive.  She was initially treated for GERD for presumed GERD related cough, she states that her heartburn improved but her cough did not improve improved.  She was then treated with Omnicef and Depo-Medrol injection with some transient improvement and then worsening.  PMH: Smoking status noted ROS: Per HPI  Objective: BP 133/77   Pulse 97   Temp (!) 97.5 F (36.4 C) (Oral)   Ht 5\' 5"  (1.651 m)   Wt 183 lb 9.6 oz (83.3 kg)   SpO2 94%   BMI 30.55 kg/m  Gen: NAD, alert, cooperative with exam HEENT: NCAT, EOMI, PERRL CV: RRR, good S1/S2, no murmur Resp: CTABL, no wheezes, non-labored Abd: SNTND, BS present, no guarding or organomegaly Ext: No edema, warm Neuro: Alert and oriented, No gross deficits  Assessment and plan:  #Community-acquired pneumonia Chest x-ray concerning for pneumonia today. Azithromycin plus [place] Discussed supportive care Return to clinic in 1 month for follow-up chest x-ray    Meds ordered this encounter  Medications  . azithromycin (ZITHROMAX) 250 MG tablet    Sig: Take 2 tablets on day 1 and 1 tablet daily after that    Dispense:  6 tablet    Refill:  0  . predniSONE (DELTASONE) 10 MG tablet    Sig: Take 4 pills a day for 3 days, then 3 pills a day for 3 days, then 2 pills a day for 3 days, then 1 pill a day for 3 days, then stop    Dispense:  30 tablet    Refill:  0  . amoxicillin-clavulanate (AUGMENTIN) 875-125 MG tablet    Sig: Take 1 tablet by mouth 2 (two) times daily.    Dispense:  20 tablet    Refill:  0    Laroy Apple, MD [facility] 01/22/2018, 4:24 PM

## 2018-02-02 ENCOUNTER — Telehealth: Payer: Self-pay | Admitting: Physician Assistant

## 2018-02-02 NOTE — Telephone Encounter (Signed)
Would you like for patient to come in just for xray in 3-4 weeks or do you want her to follow up with PCP and Xray

## 2018-02-02 NOTE — Telephone Encounter (Signed)
Patient aware.

## 2018-02-02 NOTE — Telephone Encounter (Signed)
folllow up for xray only is ok.   Janice Apple, MD [facility] 02/02/2018, 11:42 AM

## 2018-02-26 ENCOUNTER — Other Ambulatory Visit: Payer: Self-pay | Admitting: Family Medicine

## 2018-02-26 ENCOUNTER — Other Ambulatory Visit (INDEPENDENT_AMBULATORY_CARE_PROVIDER_SITE_OTHER): Payer: BC Managed Care – PPO

## 2018-02-26 DIAGNOSIS — J189 Pneumonia, unspecified organism: Secondary | ICD-10-CM

## 2018-03-12 ENCOUNTER — Encounter: Payer: Self-pay | Admitting: Family

## 2018-03-12 ENCOUNTER — Ambulatory Visit: Payer: BC Managed Care – PPO | Admitting: Family

## 2018-03-12 VITALS — BP 134/73 | HR 88 | Temp 97.9°F | Ht 65.0 in | Wt 187.8 lb

## 2018-03-12 DIAGNOSIS — J40 Bronchitis, not specified as acute or chronic: Secondary | ICD-10-CM | POA: Diagnosis not present

## 2018-03-12 DIAGNOSIS — J329 Chronic sinusitis, unspecified: Secondary | ICD-10-CM

## 2018-03-12 MED ORDER — METHYLPREDNISOLONE ACETATE 80 MG/ML IJ SUSP
80.0000 mg | Freq: Once | INTRAMUSCULAR | Status: AC
  Administered 2018-03-12: 80 mg via INTRAMUSCULAR

## 2018-03-12 MED ORDER — DOXYCYCLINE HYCLATE 100 MG PO TABS: 100.0000 mg | ORAL_TABLET | Freq: Two times a day (BID) | ORAL | 0 refills | Status: DC

## 2018-03-12 NOTE — Progress Notes (Signed)
   Subjective:    Patient ID: [name], female    DOB: 08-12-1951, 67 y.o.   MRN: [id number]  Sinusitis  This is a new problem. The current episode started in the past 7 days. The problem is unchanged. There has been no fever. Her pain is at a severity of 6/10. The pain is moderate. Associated symptoms include congestion, coughing, ear pain, headaches, a hoarse voice, sinus pressure, sneezing and a sore throat. Past treatments include oral decongestants. The treatment provided mild relief.      Review of Systems  HENT: Positive for congestion, ear pain, hoarse voice, sinus pressure, sneezing and sore throat.   Respiratory: Positive for cough.   Neurological: Positive for headaches.  All other systems reviewed and are negative.      Objective:   Physical Exam  Constitutional: She is oriented to person, place, and time. She appears well-developed and well-nourished. No distress.  HENT:  Head: Normocephalic and atraumatic.  Right Ear: External ear normal.  Left Ear: External ear normal.  Nose: Mucosal edema and rhinorrhea present. Right sinus exhibits maxillary sinus tenderness. Left sinus exhibits maxillary sinus tenderness.  Mouth/Throat: Posterior oropharyngeal erythema present.  Eyes: Pupils are equal, round, and reactive to light.  Neck: Normal range of motion. Neck supple. No thyromegaly present.  Cardiovascular: Normal rate, regular rhythm, normal heart sounds and intact distal pulses.  No murmur heard. Pulmonary/Chest: Effort normal. No respiratory distress. She has no wheezes. She has rhonchi.  Abdominal: Soft. Bowel sounds are normal. She exhibits no distension. There is no tenderness.  Musculoskeletal: Normal range of motion. She exhibits no edema or tenderness.  Neurological: She is alert and oriented to person, place, and time.  Skin: Skin is warm and dry.  Psychiatric: She has a normal mood and affect. Her behavior is normal. Judgment and thought content normal.    Vitals reviewed.     BP 134/73   Pulse 88   Temp 97.9 F (36.6 C) (Oral)   Ht 5\' 5"  (1.651 m)   Wt 187 lb 12.8 oz (85.2 kg)   BMI 31.25 kg/m      Assessment & Plan:  1. Sinobronchitis - Take meds as prescribed - Use a cool mist humidifier  -Use saline nose sprays frequently -Force fluids -For any cough or congestion  Use plain Mucinex- regular strength or max strength is fine -For fever or aces or pains- take tylenol or ibuprofen. -Throat lozenges if help RTO or if symptoms worsen or do not improve - doxycycline (VIBRA-TABS) 100 MG tablet; Take 1 tablet (100 mg total) by mouth 2 (two) times daily.  Dispense: 20 tablet; Refill: 0 - methylPREDNISolone acetate (DEPO-MEDROL) injection 80 mg    Evelina Dun, FNP

## 2018-03-12 NOTE — Patient Instructions (Signed)

## 2018-06-04 ENCOUNTER — Encounter: Payer: Self-pay | Admitting: Nurse Practitioner

## 2018-06-04 ENCOUNTER — Ambulatory Visit: Payer: BC Managed Care – PPO | Admitting: Nurse Practitioner

## 2018-06-04 ENCOUNTER — Ambulatory Visit (INDEPENDENT_AMBULATORY_CARE_PROVIDER_SITE_OTHER): Payer: BC Managed Care – PPO

## 2018-06-04 VITALS — BP 138/77 | HR 84 | Temp 97.2°F | Ht 65.0 in | Wt 185.0 lb

## 2018-06-04 DIAGNOSIS — G629 Polyneuropathy, unspecified: Secondary | ICD-10-CM

## 2018-06-04 DIAGNOSIS — M25571 Pain in right ankle and joints of right foot: Secondary | ICD-10-CM

## 2018-06-04 MED ORDER — GABAPENTIN 300 MG PO CAPS: 300.0000 mg | ORAL_CAPSULE | Freq: Three times a day (TID) | ORAL | 3 refills | Status: DC

## 2018-06-04 MED ORDER — NAPROXEN 500 MG PO TABS: 500.0000 mg | ORAL_TABLET | Freq: Two times a day (BID) | ORAL | 1 refills | Status: DC

## 2018-06-04 NOTE — Progress Notes (Signed)
   Subjective:    Patient ID: [name], female    DOB: 11-09-1951, 67 y.o.   MRN: [id number]   Chief Complaint: Ankle Pain (right) and Foot Pain (right)   HPI Patient comes in c/o right foot pain. Started about 3 weeks ago and is gradually getting worse. She denies any type of injury. Describes pan as burning sensation. No swelling. Rates pain 6/10. walking increases pain. Heat and ice help some. Motrin has helped.    Review of Systems  Constitutional: Negative.   Respiratory: Negative.   Cardiovascular: Negative.   Gastrointestinal: Negative.   Genitourinary: Negative.   Psychiatric/Behavioral: Negative.   All other systems reviewed and are negative.      Objective:   Physical Exam  Constitutional: She is oriented to person, place, and time. She appears well-developed and well-nourished. She appears distressed (mild).  Cardiovascular: Normal rate.  Pulmonary/Chest: Effort normal.  Neurological: She is alert and oriented to person, place, and time.  Skin: Skin is warm.  Psychiatric: She has a normal mood and affect. Her behavior is normal. Judgment and thought content normal.   BP 138/77   Pulse 84   Temp (!) 97.2 F (36.2 C) (Oral)   Ht 5\' 5"  (1.651 m)   Wt 185 lb (83.9 kg)   BMI 30.79 kg/m   Right ankle and foot xray- normal-Preliminary reading by Ronnald Collum, FNP  [facility]     Assessment & Plan:  [name] in today with chief complaint of Ankle Pain (right) and Foot Pain (right)   1. Acute right ankle pain - DG Foot Complete Right; Future - DG Ankle Complete Right; Future  2. Neuropathy Continue alternating cold and heat if help Is not improving let me know - gabapentin (NEURONTIN) 300 MG capsule; Take 1 capsule (300 mg total) by mouth 3 (three) times daily.  Dispense: 90 capsule; Refill: [place], FNP

## 2018-06-04 NOTE — Patient Instructions (Signed)
Peripheral Neuropathy Peripheral neuropathy is a type of nerve damage. It affects nerves that carry signals between the spinal cord and other parts of the body. These are called peripheral nerves. With peripheral neuropathy, one nerve or a group of nerves may be damaged. What are the causes? Many things can damage peripheral nerves. For some people with peripheral neuropathy, the cause is unknown. Some causes include:  Diabetes. This is the most common cause of peripheral neuropathy.  Injury to a nerve.  Pressure or stress on a nerve that lasts a long time.  Too little vitamin B. Alcoholism can lead to this.  Infections.  Autoimmune diseases, such as multiple sclerosis and systemic lupus erythematosus.  Inherited nerve diseases.  Some medicines, such as cancer drugs.  Toxic substances, such as lead and mercury.  Too little blood flowing to the legs.  Kidney disease.  Thyroid disease.  What are the signs or symptoms? Different people have different symptoms. The symptoms you have will depend on which of your nerves is damaged. Common symptoms include:  Loss of feeling (numbness) in the feet and hands.  Tingling in the feet and hands.  Pain that burns.  Very sensitive skin.  Weakness.  Not being able to move a part of the body (paralysis).  Muscle twitching.  Clumsiness or poor coordination.  Loss of balance.  Not being able to control your bladder.  Feeling dizzy.  Sexual problems.  How is this diagnosed? Peripheral neuropathy is a symptom, not a disease. Finding the cause of peripheral neuropathy can be hard. To figure that out, your health care provider will take a medical history and do a physical exam. A neurological exam will also be done. This involves checking things affected by your brain, spinal cord, and nerves (nervous system). For example, your health care provider will check your reflexes, how you move, and what you can feel. Other types of tests  may also be ordered, such as:  Blood tests.  A test of the fluid in your spinal cord.  Imaging tests, such as CT scans or an MRI.  Electromyography (EMG). This test checks the nerves that control muscles.  Nerve conduction velocity tests. These tests check how fast messages pass through your nerves.  Nerve biopsy. A small piece of nerve is removed. It is then checked under a microscope.  How is this treated?  Medicine is often used to treat peripheral neuropathy. Medicines may include: ? Pain-relieving medicines. Prescription or over-the-counter medicine may be suggested. ? Antiseizure medicine. This may be used for pain. ? Antidepressants. These also may help ease pain from neuropathy. ? Lidocaine. This is a numbing medicine. You might wear a patch or be given a shot. ? Mexiletine. This medicine is typically used to help control irregular heart rhythms.  Surgery. Surgery may be needed to relieve pressure on a nerve or to destroy a nerve that is causing pain.  Physical therapy to help movement.  Assistive devices to help movement. Follow these instructions at home:  Only take over-the-counter or prescription medicines as directed by your health care provider. Follow the instructions carefully for any given medicines. Do not take any other medicines without first getting approval from your health care provider.  If you have diabetes, work closely with your health care provider to keep your blood sugar under control.  If you have numbness in your feet: ? Check every day for signs of injury or infection. Watch for redness, warmth, and swelling. ? Wear padded socks and comfortable   shoes. These help protect your feet.  Do not do things that put pressure on your damaged nerve.  Do not smoke. Smoking keeps blood from getting to damaged nerves.  Avoid or limit alcohol. Too much alcohol can cause a lack of B vitamins. These vitamins are needed for healthy nerves.  Develop a good  support system. Coping with peripheral neuropathy can be stressful. Talk to a mental health specialist or join a support group if you are struggling.  Follow up with your health care provider as directed. Contact a health care provider if:  You have new signs or symptoms of peripheral neuropathy.  You are struggling emotionally from dealing with peripheral neuropathy.  You have a fever. Get help right away if:  You have an injury or infection that is not healing.  You feel very dizzy or begin vomiting.  You have chest pain.  You have trouble breathing. This information is not intended to replace advice given to you by your health care provider. Make sure you discuss any questions you have with your health care provider. Document Released: 11/01/2002 Document Revised: 04/18/2016 Document Reviewed: 07/19/2013 Elsevier Interactive Patient Education  2017 [employer] Inc[employer]  

## 2018-09-29 ENCOUNTER — Ambulatory Visit: Payer: BC Managed Care – PPO | Admitting: Physician Assistant

## 2018-10-01 ENCOUNTER — Ambulatory Visit (INDEPENDENT_AMBULATORY_CARE_PROVIDER_SITE_OTHER): Payer: Medicare HMO | Admitting: Nurse Practitioner

## 2018-10-01 ENCOUNTER — Encounter: Payer: Self-pay | Admitting: Nurse Practitioner

## 2018-10-01 VITALS — BP 132/77 | HR 88 | Temp 98.2°F | Ht 65.0 in | Wt 185.0 lb

## 2018-10-01 DIAGNOSIS — M549 Dorsalgia, unspecified: Secondary | ICD-10-CM | POA: Diagnosis not present

## 2018-10-01 MED ORDER — CYCLOBENZAPRINE HCL 10 MG PO TABS: 10.0000 mg | ORAL_TABLET | Freq: Three times a day (TID) | ORAL | 1 refills | Status: DC | PRN

## 2018-10-01 MED ORDER — PREDNISONE 10 MG (21) PO TBPK: ORAL_TABLET | ORAL | 0 refills | Status: DC

## 2018-10-01 NOTE — Progress Notes (Signed)
   Subjective:    Patient ID: [name], female    DOB: 30-Oct-1951, 67 y.o.   MRN: [id number]   Chief Complaint: Back Pain (Low back)   HPI Back surgery 11 years ago, and current pain started 2-3 months ago, steadily got worse, feel off bed and hit left hip 3 weeks ago, and back has been hurting more, hurts worse when lying down, 5/10 now and at worst 10/10, worse in the AM when goes to get out of bed, pain radiates down left leg, patient has been using heating pad with some relief, take ibuprofen helps some, bending/lifting makes pain worse. No sudden bowel or bladder incontinence     Review of Systems  Constitutional: Negative.   HENT: Negative.   Eyes: Negative.   Respiratory: Negative.   Cardiovascular: Negative.   Gastrointestinal: Negative.   Endocrine: Negative.   Genitourinary: Negative.   Musculoskeletal: Positive for back pain. Negative for gait problem.  Skin: Negative.   Allergic/Immunologic: Negative.   Neurological: Negative for dizziness and numbness.  Hematological: Negative.   Psychiatric/Behavioral: Negative.        Objective:   Physical Exam  Constitutional: She is oriented to person, place, and time. She appears well-developed.  Neck: Normal range of motion.  Cardiovascular: Normal rate and regular rhythm.  Pulmonary/Chest: Effort normal and breath sounds normal.  Abdominal: Soft.  Musculoskeletal: Normal range of motion.       Lumbar back: She exhibits pain.  Positive raised leg test bilaterally   Neurological: She is alert and oriented to person, place, and time.  Skin: Skin is warm and dry.  Psychiatric: She has a normal mood and affect. Her behavior is normal.    BP 132/77   Pulse 88   Temp 98.2 F (36.8 C) (Oral)   Ht 5\' 5"  (1.651 m)   Wt 185 lb (83.9 kg)   BMI 30.79 kg/m      Assessment & Plan:  [name] in today with chief complaint of Back Pain (Low back)   1. Back pain without radiation -body mechanics -  cyclobenzaprine (FLEXERIL) 10 MG tablet; Take 1 tablet (10 mg total) by mouth 3 (three) times daily as needed for muscle spasms.  Dispense: 30 tablet; Refill: 1 - predniSONE (STERAPRED UNI-PAK 21 TAB) 10 MG (21) TBPK tablet; As directed x 6 days  Dispense: 21 tablet; Refill: 0 -heat/ice PRN -may need Neurosurgery referral if fails conservative treatment  RTO PRN  Mary-Margaret Hassell Done, FNP

## 2018-10-01 NOTE — Patient Instructions (Signed)

## 2018-10-08 ENCOUNTER — Telehealth: Payer: Self-pay

## 2018-10-08 MED ORDER — AZITHROMYCIN 250 MG PO TABS: ORAL_TABLET | ORAL | 0 refills | Status: DC

## 2018-10-08 NOTE — Telephone Encounter (Signed)
Finished prednisone yesterday and back is much better. She sounds terrible and thinks she has bronchitis. Can meds be sent in? Please advise

## 2018-10-24 ENCOUNTER — Encounter: Payer: Self-pay | Admitting: Nurse Practitioner

## 2018-10-24 ENCOUNTER — Ambulatory Visit (INDEPENDENT_AMBULATORY_CARE_PROVIDER_SITE_OTHER): Payer: Medicare HMO | Admitting: Nurse Practitioner

## 2018-10-24 VITALS — BP 133/78 | HR 88 | Temp 97.7°F | Ht 65.0 in | Wt 186.0 lb

## 2018-10-24 DIAGNOSIS — R69 Illness, unspecified: Secondary | ICD-10-CM | POA: Diagnosis not present

## 2018-10-24 DIAGNOSIS — M545 Low back pain, unspecified: Secondary | ICD-10-CM

## 2018-10-24 DIAGNOSIS — F411 Generalized anxiety disorder: Secondary | ICD-10-CM | POA: Diagnosis not present

## 2018-10-24 MED ORDER — ESCITALOPRAM OXALATE 10 MG PO TABS: 10.0000 mg | ORAL_TABLET | Freq: Every evening | ORAL | 3 refills | Status: DC

## 2018-10-24 MED ORDER — METHYLPREDNISOLONE ACETATE 80 MG/ML IJ SUSP
80.0000 mg | Freq: Once | INTRAMUSCULAR | Status: AC
  Administered 2018-10-24: 80 mg via INTRAMUSCULAR

## 2018-10-24 MED ORDER — PREDNISONE 20 MG PO TABS: ORAL_TABLET | ORAL | 0 refills | Status: DC

## 2018-10-24 NOTE — Patient Instructions (Signed)

## 2018-10-24 NOTE — Progress Notes (Signed)
   Subjective:    Patient ID: [name], female    DOB: August 31, 1951, 67 y.o.   MRN: [id number]   Chief Complaint: Back Pain   HPI Patient comes in today c/o of recurrent back pain. Started again this morning. She had this same problem over a month ago. The pain radiates around into groin area. She had back surgery 10 years ago and pain feels same as it did before she had surgery.   Review of Systems  Respiratory: Negative.   Cardiovascular: Negative.   Gastrointestinal: Negative.   Musculoskeletal: Positive for back pain.  Neurological: Negative.   Psychiatric/Behavioral: Negative.   All other systems reviewed and are negative.      Objective:   Physical Exam  Constitutional: She is oriented to person, place, and time. She appears well-developed and well-nourished. No distress.  Cardiovascular: Normal rate and regular rhythm.  Pulmonary/Chest: Effort normal and breath sounds normal.  Musculoskeletal:  FROM of lumbar spine with pain on flexion and extension (-) SLR bil   Neurological: She is alert and oriented to person, place, and time. She displays normal reflexes.  Skin: Skin is warm.  Psychiatric: She has a normal mood and affect. Her behavior is normal. Thought content normal.  Nursing note and vitals reviewed.   BP 133/78   Pulse 88   Temp 97.7 F (36.5 C)   Ht 5\' 5"  (1.651 m)   Wt 186 lb (84.4 kg)   BMI 30.95 kg/m        Assessment & Plan:  [name] in today with chief complaint of Back Pain   1. Acute midline low back pain without sciatica Back stretches Moist heat - methylPREDNISolone acetate (DEPO-MEDROL) injection 80 mg - predniSONE (DELTASONE) 20 MG tablet; 2 po at sametime daily for 5 days- start tomorrow  Dispense: 10 tablet; Refill: 0  2. GAD (generalized anxiety disorder) Stress management - escitalopram (LEXAPRO) 10 MG tablet; Take 1 tablet (10 mg total) by mouth every evening.  Dispense: 90 tablet; Refill: [place],  FNP

## 2018-10-29 DIAGNOSIS — M810 Age-related osteoporosis without current pathological fracture: Secondary | ICD-10-CM | POA: Diagnosis not present

## 2018-10-29 DIAGNOSIS — R69 Illness, unspecified: Secondary | ICD-10-CM | POA: Diagnosis not present

## 2018-10-29 DIAGNOSIS — Z79899 Other long term (current) drug therapy: Secondary | ICD-10-CM | POA: Diagnosis not present

## 2018-11-14 ENCOUNTER — Telehealth: Payer: Self-pay | Admitting: Physician Assistant

## 2018-11-14 MED ORDER — AZITHROMYCIN 250 MG PO TABS: ORAL_TABLET | ORAL | 0 refills | Status: DC

## 2018-11-14 NOTE — Telephone Encounter (Signed)
Patient aware.

## 2018-11-14 NOTE — Telephone Encounter (Signed)
I sent in azithromycin for her, if she continues to worsen then she needs to be seen

## 2018-12-17 ENCOUNTER — Ambulatory Visit (INDEPENDENT_AMBULATORY_CARE_PROVIDER_SITE_OTHER): Payer: Medicare HMO | Admitting: Nurse Practitioner

## 2018-12-17 ENCOUNTER — Encounter: Payer: Self-pay | Admitting: Nurse Practitioner

## 2018-12-17 VITALS — BP 130/80 | HR 89 | Temp 97.0°F | Ht 65.0 in | Wt 186.0 lb

## 2018-12-17 DIAGNOSIS — Z0000 Encounter for general adult medical examination without abnormal findings: Secondary | ICD-10-CM

## 2018-12-17 NOTE — Patient Instructions (Signed)
Health Maintenance After Age 65 After age 65, you are at a higher risk for certain long-term diseases and infections as well as injuries from falls. Falls are a major cause of broken bones and head injuries in people who are older than age 66. Getting regular preventive care can help to keep you healthy and well. Preventive care includes getting regular testing and making lifestyle changes as recommended by your health care provider. Talk with your health care provider about:  Which screenings and tests you should have. A screening is a test that checks for a disease when you have no symptoms.  A diet and exercise plan that is right for you. What should I know about screenings and tests to prevent falls? Screening and testing are the best ways to find a health problem early. Early diagnosis and treatment give you the best chance of managing medical conditions that are common after age 66. Certain conditions and lifestyle choices may make you more likely to have a fall. Your health care provider may recommend:  Regular vision checks. Poor vision and conditions such as cataracts can make you more likely to have a fall. If you wear glasses, make sure to get your prescription updated if your vision changes.  Medicine review. Work with your health care provider to regularly review all of the medicines you are taking, including over-the-counter medicines. Ask your health care provider about any side effects that may make you more likely to have a fall. Tell your health care provider if any medicines that you take make you feel dizzy or sleepy.  Osteoporosis screening. Osteoporosis is a condition that causes the bones to get weaker. This can make the bones weak and cause them to break more easily.  Blood pressure screening. Blood pressure changes and medicines to control blood pressure can make you feel dizzy.  Strength and balance checks. Your health care provider may recommend certain tests to check your  strength and balance while standing, walking, or changing positions.  Foot health exam. Foot pain and numbness, as well as not wearing proper footwear, can make you more likely to have a fall.  Depression screening. You may be more likely to have a fall if you have a fear of falling, feel emotionally low, or feel unable to do activities that you used to do.  Alcohol use screening. Using too much alcohol can affect your balance and may make you more likely to have a fall. What actions can I take to lower my risk of falls? General instructions  Talk with your health care provider about your risks for falling. Tell your health care provider if: ? You fall. Be sure to tell your health care provider about all falls, even ones that seem minor. ? You feel dizzy, sleepy, or off-balance.  Take over-the-counter and prescription medicines only as told by your health care provider. These include any supplements.  Eat a healthy diet and maintain a healthy weight. A healthy diet includes low-fat dairy products, low-fat (lean) meats, and fiber from whole grains, beans, and lots of fruits and vegetables. Home safety  Remove any tripping hazards, such as rugs, cords, and clutter.  Install safety equipment such as grab bars in bathrooms and safety rails on stairs.  Keep rooms and walkways well-lit. Activity   Follow a regular exercise program to stay fit. This will help you maintain your balance. Ask your health care provider what types of exercise are appropriate for you.  If you need a cane or   walker, use it as recommended by your health care provider.  Wear supportive shoes that have nonskid soles. Lifestyle  Do not drink alcohol if your health care provider tells you not to drink.  If you drink alcohol, limit how much you have: ? 0-1 drink a day for women. ? 0-2 drinks a day for men.  Be aware of how much alcohol is in your drink. In the [place] one drink equals one typical bottle of beer (12  oz), one-half glass of wine (5 oz), or one shot of hard liquor (1 oz).  Do not use any products that contain nicotine or tobacco, such as cigarettes and e-cigarettes. If you need help quitting, ask your health care provider. Summary  Having a healthy lifestyle and getting preventive care can help to protect your health and wellness after age 66.  Screening and testing are the best way to find a health problem early and help you avoid having a fall. Early diagnosis and treatment give you the best chance for managing medical conditions that are more common for people who are older than age 66.  Falls are a major cause of broken bones and head injuries in people who are older than age 66. Take precautions to prevent a fall at home.  Work with your health care provider to learn what changes you can make to improve your health and wellness and to prevent falls. This information is not intended to replace advice given to you by your health care provider. Make sure you discuss any questions you have with your health care provider. Document Released: 09/24/2017 Document Revised: 09/24/2017 Document Reviewed: 09/24/2017 Elsevier Interactive Patient Education  2019 [employer]  

## 2018-12-17 NOTE — Progress Notes (Signed)
Subjective:    [name] is a 68 y.o. female who presents for a Welcome to Medicare exam.   Review of Systems Review of Systems  Constitutional: Negative.   HENT: Negative.   Cardiovascular: Negative.   Genitourinary: Negative.   Musculoskeletal: Negative.   Skin: Negative.   Neurological: Negative.   Psychiatric/Behavioral: Negative.   All other systems reviewed and are negative.          Objective:    Today's Vitals   12/17/18 1516  BP: 130/80  Pulse: 89  Temp: (!) 97 F (36.1 C)  TempSrc: Oral  Weight: 186 lb (84.4 kg)  Height: 5\' 5"  (1.651 m)  Body mass index is 30.95 kg/m.  Medications Outpatient Encounter Medications as of 12/17/2018  Medication Sig  . albuterol (PROVENTIL HFA;VENTOLIN HFA) 108 (90 Base) MCG/ACT inhaler Inhale 2 puffs into the lungs every 4 (four) hours as needed for wheezing or shortness of breath. (Patient not taking: Reported on 06/04/2018)  . azithromycin (ZITHROMAX) 250 MG tablet Take 2 the first day and then one each day after.  . escitalopram (LEXAPRO) 10 MG tablet Take 1 tablet (10 mg total) by mouth every evening.  . gabapentin (NEURONTIN) 300 MG capsule Take 1 capsule (300 mg total) by mouth 3 (three) times daily. (Patient not taking: Reported on 10/01/2018)  . naproxen (NAPROSYN) 500 MG tablet Take 1 tablet (500 mg total) by mouth 2 (two) times daily with a meal. (Patient not taking: Reported on 10/01/2018)  . omeprazole (PRILOSEC) 20 MG capsule Take 1 capsule (20 mg total) by mouth daily.  . predniSONE (DELTASONE) 20 MG tablet 2 po at sametime daily for 5 days- start tomorrow   No facility-administered encounter medications on file as of 12/17/2018.      History: No past medical history on file. Past Surgical History:  Procedure Laterality Date  . CESAREAN SECTION    . KNEE SURGERY     Bilaterally    Family History  Problem Relation Age of Onset  . Heart disease Mother   . Hypertension Mother   . Alcohol abuse Father     Social History   Occupational History  . Nutrition site manager  Tobacco Use  . Smoking status: Never Smoker  . Smokeless tobacco: Never Used  Substance and Sexual Activity  . Alcohol use: No  . Drug use: Not on file  . Sexual activity: Not on file    Tobacco Counseling Not smoker   Immunizations and Health Maintenance  There is no immunization history on file for this patient. Health Maintenance Due  Topic Date Due  . Hepatitis C Screening  03-04-1951  . HIV Screening  10/19/1968  . TETANUS/TDAP  10/19/1972  . PAP SMEAR-Modifier  10/19/1974  . COLONOSCOPY  06/28/2017  . DEXA SCAN  10/19/2018  . PNA vac Low Risk Adult (1 of 2 - PCV13) 10/19/2018    Activities of Daily Living Takes care of herself and helps raise her grandchildren  Physical Exam  EKG- NSR  Advanced Directives:  has and family knows where they are if needed    Assessment:    This is a routine wellness examination for this patient . Welcome  To medicare   Vision/Hearing screen Last eye exam was June 2019  Dietary issues and exercise activities discussed:     Goals   would like to lose some weight this year. At least 20lbs    Depression Screen PHQ 2/9 Scores 10/01/2018 06/04/2018 03/12/2018 01/22/2018  PHQ -  2 Score 0 0 0 1     Fall Risk Fall Risk  10/01/2018  Falls in the past year? 1  Number falls in past yr: 0  Injury with Fall? 0    Cognitive Function:        Patient Care Team: Theodoro Clock as PCP - General (Physician Assistant)     Plan:    welcome to medicare  I have personally reviewed and noted the following in the patient's chart:   . Medical and social history . Use of alcohol, tobacco or illicit drugs  . Current medications and supplements . Functional ability and status . Nutritional status . Physical activity . Advanced directives . List of other physicians . Hospitalizations, surgeries, and ER visits in previous 12 months . Vitals . Screenings  to include cognitive, depression, and falls . Referrals and appointments  In addition, I have reviewed and discussed with patient certain preventive protocols, quality metrics, and best practice recommendations. A written personalized care plan for preventive services as well as general preventive health recommendations were provided to patient.     [place], [place] 12/17/2018

## 2019-02-15 ENCOUNTER — Telehealth (INDEPENDENT_AMBULATORY_CARE_PROVIDER_SITE_OTHER): Payer: Medicare HMO | Admitting: Nurse Practitioner

## 2019-02-15 ENCOUNTER — Other Ambulatory Visit: Payer: Self-pay

## 2019-02-15 DIAGNOSIS — J301 Allergic rhinitis due to pollen: Secondary | ICD-10-CM | POA: Diagnosis not present

## 2019-02-15 NOTE — Patient Instructions (Signed)

## 2019-02-15 NOTE — Progress Notes (Signed)
   Virtual Visit via telephone Note  I connected with [name] on 02/15/19 at 2:51 pm by telephone and verified that I am speaking with the correct person using two identifiers. [name] is currently located at home and husband is currently with her during visit. The provider, Mary-Margaret Hassell Done, FNP is located in their office at time of visit.  I discussed the limitations, risks, security and privacy concerns of performing an evaluation and management service by telephone and the availability of in person appointments. I also discussed with the patient that there may be a patient responsible charge related to this service. The patient expressed understanding and agreed to proceed.   History and Present Illness:   Chief Complaint: cough  HPI Patient calls in today c/o cough and congestion. She thinks it is coming from her sinuses. She has been using afrin to open up sinuses. She takes a xyzal at night.  Review of Systems  Constitutional: Negative for chills and fever.  HENT: Positive for congestion and sinus pain. Negative for hearing loss and sore throat.   Eyes: Negative.   Respiratory: Positive for cough (slight this morning).   Cardiovascular: Negative.   Gastrointestinal: Negative.   Genitourinary: Negative.   Neurological: Negative.   Psychiatric/Behavioral: Negative.   All other systems reviewed and are negative.     Observations/Objective: Alert and oriented- answers all questions appropriately Voice normal No cough during exam  Assessment and Plan: [name] in today with chief complaint of No chief complaint on file.   1. Seasonal allergic rhinitis due to pollen Continue flonase as rx Force fluids Avoid alergens   Follow Up Instructions:  Prn    I discussed the assessment and treatment plan with the patient. The patient was provided an opportunity to ask questions and all were answered. The patient agreed with the plan and demonstrated an  understanding of the instructions.   The patient was advised to call back or seek an in-person evaluation if the symptoms worsen or if the condition fails to improve as anticipated.  The above assessment and management plan was discussed with the patient. The patient verbalized understanding of and has agreed to the management plan. Patient is aware to call the clinic if symptoms persist or worsen. Patient is aware when to return to the clinic for a follow-up visit. Patient educated on when it is appropriate to go to the emergency department.    I provided 9 minutes of non-face-to-face time during this encounter.    Mary-Margaret Hassell Done, FNP

## 2019-02-18 ENCOUNTER — Other Ambulatory Visit: Payer: Self-pay | Admitting: Physician Assistant

## 2019-02-18 ENCOUNTER — Telehealth: Payer: Self-pay | Admitting: Physician Assistant

## 2019-02-18 MED ORDER — PREDNISONE 10 MG (48) PO TBPK: ORAL_TABLET | ORAL | 0 refills | Status: DC

## 2019-02-18 NOTE — Telephone Encounter (Signed)
Sterapred Dosepak and sent to the pharmacy

## 2019-02-18 NOTE — Telephone Encounter (Signed)
Patient daughter away.

## 2019-05-20 ENCOUNTER — Other Ambulatory Visit: Payer: Self-pay

## 2019-05-21 ENCOUNTER — Ambulatory Visit (INDEPENDENT_AMBULATORY_CARE_PROVIDER_SITE_OTHER): Payer: Medicare HMO

## 2019-05-21 ENCOUNTER — Encounter: Payer: Self-pay | Admitting: Nurse Practitioner

## 2019-05-21 ENCOUNTER — Ambulatory Visit (INDEPENDENT_AMBULATORY_CARE_PROVIDER_SITE_OTHER): Payer: Medicare HMO | Admitting: Nurse Practitioner

## 2019-05-21 VITALS — BP 124/75 | HR 84 | Temp 97.6°F | Ht 65.0 in | Wt 171.0 lb

## 2019-05-21 DIAGNOSIS — R69 Illness, unspecified: Secondary | ICD-10-CM | POA: Diagnosis not present

## 2019-05-21 DIAGNOSIS — Z780 Asymptomatic menopausal state: Secondary | ICD-10-CM | POA: Diagnosis not present

## 2019-05-21 DIAGNOSIS — F411 Generalized anxiety disorder: Secondary | ICD-10-CM

## 2019-05-21 DIAGNOSIS — Z23 Encounter for immunization: Secondary | ICD-10-CM | POA: Diagnosis not present

## 2019-05-21 DIAGNOSIS — Z0001 Encounter for general adult medical examination with abnormal findings: Secondary | ICD-10-CM | POA: Diagnosis not present

## 2019-05-21 DIAGNOSIS — Z0000 Encounter for general adult medical examination without abnormal findings: Secondary | ICD-10-CM | POA: Diagnosis not present

## 2019-05-21 DIAGNOSIS — Z13820 Encounter for screening for osteoporosis: Secondary | ICD-10-CM

## 2019-05-21 DIAGNOSIS — Z136 Encounter for screening for cardiovascular disorders: Secondary | ICD-10-CM | POA: Diagnosis not present

## 2019-05-21 LAB — URINALYSIS, COMPLETE
Bilirubin, UA: NEGATIVE
Glucose, UA: NEGATIVE
Ketones, UA: NEGATIVE
Leukocytes,UA: NEGATIVE
Nitrite, UA: NEGATIVE
Protein,UA: NEGATIVE
Specific Gravity, UA: >1.03 — ABNORMAL HIGH (ref 1.005–1.030)
Urobilinogen, Ur: 0.2 mg/dL (ref 0.2–1.0)
pH, UA: 5.5 (ref 5.0–7.5)

## 2019-05-21 LAB — MICROSCOPIC EXAMINATION: Renal Epithel, UA: NONE SEEN /hpf

## 2019-05-21 MED ORDER — ALPRAZOLAM 0.5 MG PO TABS: 0.5000 mg | ORAL_TABLET | Freq: Two times a day (BID) | ORAL | 1 refills | Status: DC | PRN

## 2019-05-21 MED ORDER — ESCITALOPRAM OXALATE 10 MG PO TABS: 10.0000 mg | ORAL_TABLET | Freq: Every evening | ORAL | 1 refills | Status: DC

## 2019-05-21 NOTE — Addendum Note (Signed)
Addended by: Rolena Infante on: 05/21/2019 11:38 AM   Modules accepted: Orders

## 2019-05-21 NOTE — Addendum Note (Signed)
Addended by: Chevis Pretty on: 05/21/2019 11:24 AM   Modules accepted: Orders

## 2019-05-21 NOTE — Progress Notes (Signed)
Subjective:    Patient ID: [name], female    DOB: 1951/09/06, 68 y.o.   MRN: [id number]   Chief Complaint: Annual Exam    HPI:  1. Annual physical exam Has not had a pap in several years. Has been menopausal for over 10 years  2. GAD (generalized anxiety disorder) Is on lexapro and is working well for her. GAD 7 : Generalized Anxiety Score 05/21/2019  Nervous, Anxious, on Edge 1  Control/stop worrying 1  Worry too much - different things 1  Trouble relaxing 0  Restless 0  Easily annoyed or irritable 1  Afraid - awful might happen 0  Total GAD 7 Score 4  Anxiety Difficulty Not difficult at all        Outpatient Encounter Medications as of 05/21/2019  Medication Sig  . albuterol (PROVENTIL HFA;VENTOLIN HFA) 108 (90 Base) MCG/ACT inhaler Inhale 2 puffs into the lungs every 4 (four) hours as needed for wheezing or shortness of breath.  . escitalopram (LEXAPRO) 10 MG tablet Take 1 tablet (10 mg total) by mouth every evening.     Past Surgical History:  Procedure Laterality Date  . CESAREAN SECTION    . KNEE SURGERY     Bilaterally    Family History  Problem Relation Age of Onset  . Heart disease Mother   . Hypertension Mother   . Alcohol abuse Father     New complaints: None today  Social history: Works part time and takes care of her grandchildren     Review of Systems  Constitutional: Negative for activity change and appetite change.  HENT: Negative.   Eyes: Negative for pain.  Respiratory: Negative for shortness of breath.   Cardiovascular: Negative for chest pain, palpitations and leg swelling.  Gastrointestinal: Negative for abdominal pain.  Endocrine: Negative for polydipsia.  Genitourinary: Negative.   Skin: Negative for rash.  Neurological: Negative for dizziness, weakness and headaches.  Hematological: Does not bruise/bleed easily.  Psychiatric/Behavioral: Negative.   All other systems reviewed and are negative.      Objective:    Physical Exam Vitals signs and nursing note reviewed.  Constitutional:      General: She is not in acute distress.    Appearance: Normal appearance. She is well-developed.  HENT:     Head: Normocephalic.     Nose: Nose normal.  Eyes:     Pupils: Pupils are equal, round, and reactive to light.  Neck:     Musculoskeletal: Normal range of motion and neck supple.     Vascular: No carotid bruit or JVD.  Cardiovascular:     Rate and Rhythm: Normal rate and regular rhythm.     Heart sounds: Normal heart sounds.  Pulmonary:     Effort: Pulmonary effort is normal. No respiratory distress.     Breath sounds: Normal breath sounds. No wheezing or rales.  Chest:     Chest wall: No tenderness.  Abdominal:     General: Bowel sounds are normal. There is no distension or abdominal bruit.     Palpations: Abdomen is soft. There is no hepatomegaly, splenomegaly, mass or pulsatile mass.     Tenderness: There is no abdominal tenderness.  Genitourinary:    General: Normal vulva.     Vagina: No vaginal discharge.     Rectum: Normal.     Comments: Cervix parous and pink No adnexal mass or tenderness Musculoskeletal: Normal range of motion.  Lymphadenopathy:     Cervical: No cervical adenopathy.  Skin:    General: Skin is warm and dry.  Neurological:     Mental Status: She is alert and oriented to person, place, and time.     Deep Tendon Reflexes: Reflexes are normal and symmetric.  Psychiatric:        Behavior: Behavior normal.        Thought Content: Thought content normal.        Judgment: Judgment normal.    BP 124/75   Pulse 84   Temp 97.6 F (36.4 C) (Oral)   Ht '5\' 5"'$  (1.651 m)   Wt 171 lb (77.6 kg)   BMI 28.46 kg/m         Assessment & Plan:  [name] comes in today with chief complaint of Annual Exam   Diagnosis and orders addressed:  1. Annual physical exam - Urinalysis, Complete - CBC with Differential/Platelet - CMP14+EGFR - Lipid panel - Thyroid Panel With  TSH - Pap IG (Image Guided)  2. GAD (generalized anxiety disorder) Stress management   Labs pending Health Maintenance reviewed Diet and exercise encouraged  Follow up plan: 6 months-1year   [place], FNP

## 2019-05-21 NOTE — Addendum Note (Signed)
Addended by: Chevis Pretty on: 05/21/2019 12:00 PM   Modules accepted: Orders

## 2019-05-21 NOTE — Patient Instructions (Signed)

## 2019-05-22 LAB — CMP14+EGFR
ALT: 27 IU/L (ref 0–32)
AST: 35 IU/L (ref 0–40)
Albumin/Globulin Ratio: 2.4 — ABNORMAL HIGH (ref 1.2–2.2)
Albumin: 4.5 g/dL (ref 3.8–4.8)
Alkaline Phosphatase: 119 IU/L — ABNORMAL HIGH (ref 39–117)
BUN/Creatinine Ratio: 14 (ref 12–28)
BUN: 10 mg/dL (ref 8–27)
Bilirubin Total: 0.4 mg/dL (ref 0.0–1.2)
CO2: 20 mmol/L (ref 20–29)
Calcium: 9.4 mg/dL (ref 8.7–10.3)
Chloride: 106 mmol/L (ref 96–106)
Creatinine, Ser: 0.74 mg/dL (ref 0.57–1.00)
GFR calc Af Amer: 98 mL/min/{1.73_m2} (ref >59)
GFR calc non Af Amer: 85 mL/min/{1.73_m2} (ref >59)
Globulin, Total: 1.9 g/dL (ref 1.5–4.5)
Glucose: 86 mg/dL (ref 65–99)
Potassium: 4 mmol/L (ref 3.5–5.2)
Sodium: 145 mmol/L — ABNORMAL HIGH (ref 134–144)
Total Protein: 6.4 g/dL (ref 6.0–8.5)

## 2019-05-22 LAB — CBC WITH DIFFERENTIAL/PLATELET
Basophils Absolute: 0.1 10*3/uL (ref 0.0–0.2)
Basos: 2 %
EOS (ABSOLUTE): 0.3 10*3/uL (ref 0.0–0.4)
Eos: 5 %
Hematocrit: 40.6 % (ref 34.0–46.6)
Hemoglobin: 13.2 g/dL (ref 11.1–15.9)
Immature Grans (Abs): 0 10*3/uL (ref 0.0–0.1)
Immature Granulocytes: 0 %
Lymphocytes Absolute: 2.3 10*3/uL (ref 0.7–3.1)
Lymphs: 41 %
MCH: 28.7 pg (ref 26.6–33.0)
MCHC: 32.5 g/dL (ref 31.5–35.7)
MCV: 88 fL (ref 79–97)
Monocytes Absolute: 0.3 10*3/uL (ref 0.1–0.9)
Monocytes: 6 %
Neutrophils Absolute: 2.6 10*3/uL (ref 1.4–7.0)
Neutrophils: 46 %
Platelets: 209 10*3/uL (ref 150–450)
RBC: 4.6 x10E6/uL (ref 3.77–5.28)
RDW: 13.6 % (ref 11.7–15.4)
WBC: 5.6 10*3/uL (ref 3.4–10.8)

## 2019-05-22 LAB — THYROID PANEL WITH TSH
Free Thyroxine Index: 1.4 (ref 1.2–4.9)
T3 Uptake Ratio: 21 % — ABNORMAL LOW (ref 24–39)
T4, Total: 6.9 ug/dL (ref 4.5–12.0)
TSH: 2.59 u[IU]/mL (ref 0.450–4.500)

## 2019-05-22 LAB — LIPID PANEL
Chol/HDL Ratio: 6.7 ratio — ABNORMAL HIGH (ref 0.0–4.4)
Cholesterol, Total: 307 mg/dL — ABNORMAL HIGH (ref 100–199)
HDL: 46 mg/dL (ref >39)
LDL Calculated: 218 mg/dL — ABNORMAL HIGH (ref 0–99)
Triglycerides: 217 mg/dL — ABNORMAL HIGH (ref 0–149)
VLDL Cholesterol Cal: 43 mg/dL — ABNORMAL HIGH (ref 5–40)

## 2019-05-24 ENCOUNTER — Telehealth: Payer: Self-pay | Admitting: Nurse Practitioner

## 2019-05-24 ENCOUNTER — Other Ambulatory Visit: Payer: Self-pay | Admitting: Family

## 2019-05-24 DIAGNOSIS — R519 Headache, unspecified: Secondary | ICD-10-CM

## 2019-05-24 NOTE — Telephone Encounter (Signed)
Patient says she will take a statin

## 2019-05-26 ENCOUNTER — Other Ambulatory Visit: Payer: Self-pay | Admitting: Physician Assistant

## 2019-05-26 DIAGNOSIS — Z1231 Encounter for screening mammogram for malignant neoplasm of breast: Secondary | ICD-10-CM

## 2019-05-26 LAB — PAP IG (IMAGE GUIDED)

## 2019-05-26 MED ORDER — ROSUVASTATIN CALCIUM 10 MG PO TABS: 10.0000 mg | ORAL_TABLET | Freq: Every day | ORAL | 1 refills | Status: DC

## 2019-05-26 NOTE — Telephone Encounter (Signed)
crestor rx snet to pharmacy

## 2019-05-26 NOTE — Telephone Encounter (Signed)
Patient aware.

## 2019-07-06 ENCOUNTER — Telehealth: Payer: Self-pay | Admitting: Nurse Practitioner

## 2019-07-06 DIAGNOSIS — M8588 Other specified disorders of bone density and structure, other site: Secondary | ICD-10-CM | POA: Diagnosis not present

## 2019-07-15 ENCOUNTER — Other Ambulatory Visit: Payer: Self-pay

## 2019-07-15 ENCOUNTER — Ambulatory Visit
Admission: RE | Admit: 2019-07-15 | Discharge: 2019-07-15 | Disposition: A | Discharge status: Home / Self Care | Payer: Medicare HMO | Source: Ambulatory Visit | Attending: Physician Assistant | Admitting: Physician Assistant

## 2019-07-15 DIAGNOSIS — Z1231 Encounter for screening mammogram for malignant neoplasm of breast: Secondary | ICD-10-CM | POA: Diagnosis not present

## 2019-07-22 ENCOUNTER — Ambulatory Visit (INDEPENDENT_AMBULATORY_CARE_PROVIDER_SITE_OTHER): Payer: Medicare HMO | Admitting: Nurse Practitioner

## 2019-07-22 ENCOUNTER — Encounter: Payer: Self-pay | Admitting: Nurse Practitioner

## 2019-07-22 DIAGNOSIS — J0100 Acute maxillary sinusitis, unspecified: Secondary | ICD-10-CM | POA: Diagnosis not present

## 2019-07-22 MED ORDER — AMOXICILLIN-POT CLAVULANATE 875-125 MG PO TABS: 1.0000 | ORAL_TABLET | Freq: Two times a day (BID) | ORAL | 0 refills | Status: DC

## 2019-07-22 NOTE — Progress Notes (Signed)
Virtual Visit via telephone Note Due to COVID-19 pandemic this visit was conducted virtually. This visit type was conducted due to national recommendations for restrictions regarding the COVID-19 Pandemic (e.g. social distancing, sheltering in place) in an effort to limit this patient's exposure and mitigate transmission in our community. All issues noted in this document were discussed and addressed.  A physical exam was not performed with this format.  I connected with [name] on 07/22/19 at 8:15 by telephone and verified that I am speaking with the correct person using two identifiers. [name] is currently located at home and no one is currently with her during visit. The provider, Mary-Margaret Hassell Done, FNP is located in their office at time of visit.  I discussed the limitations, risks, security and privacy concerns of performing an evaluation and management service by telephone and the availability of in person appointments. I also discussed with the patient that there may be a patient responsible charge related to this service. The patient expressed understanding and agreed to proceed.   History and Present Illness:  Patient calls in c/o sore throat facial congestion and cough. Cough is productive. She has been taking sudafed and flonase.     Review of Systems  Constitutional: Negative for chills and fever.  HENT: Positive for congestion, ear pain, sinus pain and sore throat.   Respiratory: Positive for cough (slight).   Cardiovascular: Negative.   Genitourinary: Negative.   Musculoskeletal: Negative.   Neurological: Positive for headaches.  Psychiatric/Behavioral: Negative.   All other systems reviewed and are negative.    Observations/Objective: Alert and oriented- answers all questions appropriately No distress Voice is hoarse    Assessment and Plan: [name] in today with chief complaint of Cough   1. Acute non-recurrent maxillary sinusitis 1. Take  meds as prescribed 2. Use a cool mist humidifier especially during the winter months and when heat has been humid. 3. Use saline nose sprays frequently 4. Saline irrigations of the nose can be very helpful if done frequently.  * 4X daily for 1 week*  * Use of a nettie pot can be helpful with this. Follow directions with this* 5. Drink plenty of fluids 6. Keep thermostat turn down low 7.For any cough or congestion  Use plain Mucinex- regular strength or max strength is fine   * Children- consult with Pharmacist for dosing 8. For fever or aces or pains- take tylenol or ibuprofen appropriate for age and weight.  * for fevers greater than 101 orally you may alternate ibuprofen and tylenol every  3 hours.   Meds ordered this encounter  Medications  . amoxicillin-clavulanate (AUGMENTIN) 875-125 MG tablet    Sig: Take 1 tablet by mouth 2 (two) times daily.    Dispense:  14 tablet    Refill:  0    Order Specific Question:   Supervising Provider    Answer:   Caryl Pina A [id number]       Follow Up Instructions: prn    I discussed the assessment and treatment plan with the patient. The patient was provided an opportunity to ask questions and all were answered. The patient agreed with the plan and demonstrated an understanding of the instructions.   The patient was advised to call back or seek an in-person evaluation if the symptoms worsen or if the condition fails to improve as anticipated.  The above assessment and management plan was discussed with the patient. The patient verbalized understanding of and has agreed  to the management plan. Patient is aware to call the clinic if symptoms persist or worsen. Patient is aware when to return to the clinic for a follow-up visit. Patient educated on when it is appropriate to go to the emergency department.   Time call ended:  8:27  I provided 12 minutes of non-face-to-face time during this encounter.    Mary-Margaret Hassell Done, FNP

## 2019-07-26 ENCOUNTER — Telehealth: Payer: Self-pay

## 2019-07-26 ENCOUNTER — Other Ambulatory Visit: Payer: Self-pay | Admitting: Nurse Practitioner

## 2019-07-26 MED ORDER — PREDNISONE 20 MG PO TABS: ORAL_TABLET | ORAL | 0 refills | Status: DC

## 2019-07-26 NOTE — Telephone Encounter (Signed)
Patient has cough and congestion and would like prednisone called in please to [facility]

## 2019-08-23 ENCOUNTER — Ambulatory Visit: Payer: Medicare HMO | Admitting: Nurse Practitioner

## 2019-09-01 ENCOUNTER — Telehealth: Payer: Self-pay | Admitting: Nurse Practitioner

## 2019-09-01 ENCOUNTER — Ambulatory Visit (INDEPENDENT_AMBULATORY_CARE_PROVIDER_SITE_OTHER): Payer: Medicare HMO | Admitting: Nurse Practitioner

## 2019-09-01 ENCOUNTER — Encounter: Payer: Self-pay | Admitting: Nurse Practitioner

## 2019-09-01 DIAGNOSIS — J3089 Other allergic rhinitis: Secondary | ICD-10-CM | POA: Diagnosis not present

## 2019-09-01 MED ORDER — PREDNISONE 20 MG PO TABS: ORAL_TABLET | ORAL | 0 refills | Status: DC

## 2019-09-01 NOTE — Progress Notes (Signed)
   Virtual Visit via telephone Note Due to COVID-19 pandemic this visit was conducted virtually. This visit type was conducted due to national recommendations for restrictions regarding the COVID-19 Pandemic (e.g. social distancing, sheltering in place) in an effort to limit this patient's exposure and mitigate transmission in our community. All issues noted in this document were discussed and addressed.  A physical exam was not performed with this format.  I connected with [name] on 09/01/19 at 11:30 by telephone and verified that I am speaking with the correct person using two identifiers. [name] is currently located at home and no one is currently with her during visit. The provider, Mary-Margaret Hassell Done, FNP is located in their office at time of visit.  I discussed the limitations, risks, security and privacy concerns of performing an evaluation and management service by telephone and the availability of in person appointments. I also discussed with the patient that there may be a patient responsible charge related to this service. The patient expressed understanding and agreed to proceed.   History and Present Illness:   Chief Complaint: Nasal Congestion   HPI Patient calls in today c/o congestion and sneezing. Her husband has been cutting hay which makes her allergies worse.  Taking xyzal and nasacort  Review of Systems  Constitutional: Negative for chills and fever.  HENT: Positive for congestion.   Respiratory: Negative.   Cardiovascular: Negative.   Gastrointestinal: Negative.   Neurological: Negative.   Psychiatric/Behavioral: Negative.   All other systems reviewed and are negative.    Observations/Objective: Alert and oriented- answers all questions appropriately No distress    Assessment and Plan: [name] in today with chief complaint of Nasal Congestion   1. Seasonal allergic rhinitis due to other allergic trigger continiue xyzal and nasacort  Let me know if symptoms change - predniSONE (DELTASONE) 20 MG tablet; 2 po at sametime daily for 5 days  Dispense: 10 tablet; Refill: 0   Follow Up Instructions: prn    I discussed the assessment and treatment plan with the patient. The patient was provided an opportunity to ask questions and all were answered. The patient agreed with the plan and demonstrated an understanding of the instructions.   The patient was advised to call back or seek an in-person evaluation if the symptoms worsen or if the condition fails to improve as anticipated.  The above assessment and management plan was discussed with the patient. The patient verbalized understanding of and has agreed to the management plan. Patient is aware to call the clinic if symptoms persist or worsen. Patient is aware when to return to the clinic for a follow-up visit. Patient educated on when it is appropriate to go to the emergency department.   Time call ended:  11:40  I provided 10 minutes of non-face-to-face time during this encounter.    Mary-Margaret Hassell Done, FNP

## 2019-09-01 NOTE — Telephone Encounter (Signed)
Please review and advise.

## 2019-10-14 ENCOUNTER — Ambulatory Visit (INDEPENDENT_AMBULATORY_CARE_PROVIDER_SITE_OTHER): Payer: Medicare HMO | Admitting: Family

## 2019-10-14 ENCOUNTER — Other Ambulatory Visit: Payer: Self-pay

## 2019-10-14 ENCOUNTER — Encounter: Payer: Self-pay | Admitting: Family

## 2019-10-14 DIAGNOSIS — J069 Acute upper respiratory infection, unspecified: Secondary | ICD-10-CM | POA: Diagnosis not present

## 2019-10-14 DIAGNOSIS — Z20822 Contact with and (suspected) exposure to covid-19: Secondary | ICD-10-CM

## 2019-10-14 MED ORDER — PREDNISONE 10 MG (21) PO TBPK: ORAL_TABLET | ORAL | 0 refills | Status: DC

## 2019-10-14 MED ORDER — FLUTICASONE PROPIONATE 50 MCG/ACT NA SUSP: 2.0000 | Freq: Every day | NASAL | 6 refills | Status: DC

## 2019-10-14 NOTE — Progress Notes (Signed)
   Virtual Visit via telephone Note Due to COVID-19 pandemic this visit was conducted virtually. This visit type was conducted due to national recommendations for restrictions regarding the COVID-19 Pandemic (e.g. social distancing, sheltering in place) in an effort to limit this patient's exposure and mitigate transmission in our community. All issues noted in this document were discussed and addressed.  A physical exam was not performed with this format.  I connected with [name] on 10/14/19 at 8:24 AM by telephone and verified that I am speaking with the correct person using two identifiers. [name] is currently located at home and no one is currently with her during visit. The provider, Evelina Dun, FNP is located in their office at time of visit.  I discussed the limitations, risks, security and privacy concerns of performing an evaluation and management service by telephone and the availability of in person appointments. I also discussed with the patient that there may be a patient responsible charge related to this service. The patient expressed understanding and agreed to proceed.   History and Present Illness:  Sore Throat  This is a new problem. The current episode started today. There has been no fever. The pain is at a severity of 1/10. The pain is mild. Associated symptoms include congestion and a hoarse voice. Pertinent negatives include no ear discharge, ear pain, headaches, shortness of breath, swollen glands or trouble swallowing. She has tried cool liquids and NSAIDs for the symptoms. The treatment provided mild relief.      Review of Systems  HENT: Positive for congestion and hoarse voice. Negative for ear discharge, ear pain and trouble swallowing.   Respiratory: Negative for shortness of breath.   Neurological: Negative for headaches.  All other systems reviewed and are negative.    Observations/Objective: No SOB or distress noted   Assessment and Plan:  1. Viral URI - Take meds as prescribed - Use a cool mist humidifier  -Use saline nose sprays frequently -Force fluids -For any cough or congestion  Use plain Mucinex- regular strength or max strength is fine -For fever or aces or pains- take tylenol or ibuprofen. -Throat lozenges if help -Will give rx of prednisone if symptoms worsen in next 3 days. Call if symptoms worsen or do not improve  - fluticasone (FLONASE) 50 MCG/ACT nasal spray; Place 2 sprays into both nostrils daily.  Dispense: 16 g; Refill: 6 - predniSONE (STERAPRED UNI-PAK 21 TAB) 10 MG (21) TBPK tablet; Use as directed  Dispense: 21 tablet; Refill: 0    I discussed the assessment and treatment plan with the patient. The patient was provided an opportunity to ask questions and all were answered. The patient agreed with the plan and demonstrated an understanding of the instructions.   The patient was advised to call back or seek an in-person evaluation if the symptoms worsen or if the condition fails to improve as anticipated.  The above assessment and management plan was discussed with the patient. The patient verbalized understanding of and has agreed to the management plan. Patient is aware to call the clinic if symptoms persist or worsen. Patient is aware when to return to the clinic for a follow-up visit. Patient educated on when it is appropriate to go to the emergency department.   Time call ended: 8:36 AM    I provided 12 minutes of non-face-to-face time during this encounter.    Evelina Dun, FNP

## 2019-10-17 ENCOUNTER — Telehealth: Payer: Self-pay

## 2019-10-17 LAB — NOVEL CORONAVIRUS, NAA: SARS-CoV-2, NAA: NOT DETECTED

## 2019-10-17 NOTE — Telephone Encounter (Signed)
Pt called for covid test results. Advised results are not back.

## 2019-12-23 ENCOUNTER — Ambulatory Visit: Payer: Medicare HMO

## 2020-01-14 ENCOUNTER — Encounter: Payer: Self-pay | Admitting: Nurse Practitioner

## 2020-01-14 ENCOUNTER — Ambulatory Visit (INDEPENDENT_AMBULATORY_CARE_PROVIDER_SITE_OTHER): Payer: Medicare HMO | Admitting: Nurse Practitioner

## 2020-01-14 DIAGNOSIS — H1032 Unspecified acute conjunctivitis, left eye: Secondary | ICD-10-CM | POA: Diagnosis not present

## 2020-01-14 MED ORDER — ERYTHROMYCIN 5 MG/GM OP OINT: 1.0000 "application " | TOPICAL_OINTMENT | Freq: Three times a day (TID) | OPHTHALMIC | 0 refills | Status: DC

## 2020-01-14 NOTE — Progress Notes (Signed)
Virtual Visit via telephone Note Due to COVID-19 pandemic this visit was conducted virtually. This visit type was conducted due to national recommendations for restrictions regarding the COVID-19 Pandemic (e.g. social distancing, sheltering in place) in an effort to limit this patient's exposure and mitigate transmission in our community. All issues noted in this document were discussed and addressed.  A physical exam was not performed with this format.  I connected with [name] on 01/14/20 at 2:35 by telephone and verified that I am speaking with the correct person using two identifiers. [name] is currently located at home and  Her grandchildren are currently with her during visit. The provider, Mary-Margaret Hassell Done, FNP is located in their office at time of visit.  I discussed the limitations, risks, security and privacy concerns of performing an evaluation and management service by telephone and the availability of in person appointments. I also discussed with the patient that there may be a patient responsible charge related to this service. The patient expressed understanding and agreed to proceed.   History and Present Illness:   Chief Complaint: Eye Pain   HPI Patient calls in stating that her left eye has been itching and hurting for several days. This morning was red and crusted over. She says she got a piece of fuzz form a blanket in her eye several days ago and had a hard time getting it out. Allergy drops have not helped.   Review of Systems  Constitutional: Negative.   Eyes: Positive for pain (left).  All other systems reviewed and are negative.    Observations/Objective: Alert and oriented- answers all questions appropriately No distress Left eye red and swollen- according to patient   Assessment and Plan: [name] in today with chief complaint of Eye Pain   1. Acute bacterial conjunctivitis of left eye Meds ordered this encounter  Medications  .  erythromycin ophthalmic ointment    Sig: Place 1 application into the left eye 3 (three) times daily.    Dispense:  3.5 g    Refill:  0    Order Specific Question:   Supervising Provider    Answer:   Caryl Pina A [id number]   Warm compress Avoid rubbing eye    The above assessment and management plan was discussed with the patient. The patient verbalized understanding of and has agreed to the management plan. Patient is aware to call the clinic if symptoms persist or worsen. Patient is aware when to return to the clinic for a follow-up visit. Patient educated on when it is appropriate to go to the emergency department.   Mary-Margaret Hassell Done, FNP    Follow Up Instructions: prn    I discussed the assessment and treatment plan with the patient. The patient was provided an opportunity to ask questions and all were answered. The patient agreed with the plan and demonstrated an understanding of the instructions.   The patient was advised to call back or seek an in-person evaluation if the symptoms worsen or if the condition fails to improve as anticipated.  The above assessment and management plan was discussed with the patient. The patient verbalized understanding of and has agreed to the management plan. Patient is aware to call the clinic if symptoms persist or worsen. Patient is aware when to return to the clinic for a follow-up visit. Patient educated on when it is appropriate to go to the emergency department.   Time call ended:  2:45  I provided 10 minutes  of non-face-to-face time during this encounter.    Mary-Margaret Hassell Done, FNP

## 2020-02-08 ENCOUNTER — Telehealth: Payer: Self-pay | Admitting: Nurse Practitioner

## 2020-02-08 DIAGNOSIS — S39012A Strain of muscle, fascia and tendon of lower back, initial encounter: Secondary | ICD-10-CM

## 2020-02-08 MED ORDER — PREDNISONE 10 MG (21) PO TBPK: ORAL_TABLET | ORAL | 0 refills | Status: DC

## 2020-02-08 NOTE — Telephone Encounter (Signed)
Patient called stating she has a pulled muscle in her back and need repeat of prednisone. Meds ordered this encounter  Medications  . predniSONE (STERAPRED UNI-PAK 21 TAB) 10 MG (21) TBPK tablet    Sig: As directed x 6 days    Dispense:  21 tablet    Refill:  0    Order Specific Question:   Supervising Provider    Answer:   Caryl Pina A [id number]

## 2020-03-09 DIAGNOSIS — Z1283 Encounter for screening for malignant neoplasm of skin: Secondary | ICD-10-CM | POA: Diagnosis not present

## 2020-03-09 DIAGNOSIS — L7211 Pilar cyst: Secondary | ICD-10-CM | POA: Diagnosis not present

## 2020-03-09 DIAGNOSIS — L821 Other seborrheic keratosis: Secondary | ICD-10-CM | POA: Diagnosis not present

## 2020-03-09 DIAGNOSIS — D225 Melanocytic nevi of trunk: Secondary | ICD-10-CM | POA: Diagnosis not present

## 2020-05-22 ENCOUNTER — Telehealth: Payer: Self-pay | Admitting: Nurse Practitioner

## 2020-05-22 DIAGNOSIS — S299XXA Unspecified injury of thorax, initial encounter: Secondary | ICD-10-CM | POA: Diagnosis not present

## 2020-05-22 DIAGNOSIS — R0781 Pleurodynia: Secondary | ICD-10-CM | POA: Diagnosis not present

## 2020-05-22 NOTE — Telephone Encounter (Signed)
Patient has a follow up appointment scheduled. 

## 2020-05-22 NOTE — Telephone Encounter (Signed)
Pt requesting to speak with the nurse. States she fell in her driveway last Thursday, just was scraped up. States ribs are still hurting and pt would like to discuss with the nurse.

## 2020-05-25 ENCOUNTER — Ambulatory Visit: Payer: Medicare HMO | Admitting: Nurse Practitioner

## 2020-05-27 ENCOUNTER — Emergency Department (HOSPITAL_COMMUNITY)
Admission: EM | Admit: 2020-05-27 | Discharge: 2020-05-28 | Disposition: A | Discharge status: Home / Self Care | Payer: Medicare HMO | Attending: Emergency Medicine | Admitting: Emergency Medicine

## 2020-05-27 ENCOUNTER — Other Ambulatory Visit: Payer: Self-pay

## 2020-05-27 ENCOUNTER — Encounter (HOSPITAL_COMMUNITY): Payer: Self-pay | Admitting: Emergency Medicine

## 2020-05-27 DIAGNOSIS — W010XXA Fall on same level from slipping, tripping and stumbling without subsequent striking against object, initial encounter: Secondary | ICD-10-CM | POA: Insufficient documentation

## 2020-05-27 DIAGNOSIS — Y92093 Driveway of other non-institutional residence as the place of occurrence of the external cause: Secondary | ICD-10-CM | POA: Diagnosis not present

## 2020-05-27 DIAGNOSIS — S8002XA Contusion of left knee, initial encounter: Secondary | ICD-10-CM | POA: Insufficient documentation

## 2020-05-27 DIAGNOSIS — S8001XA Contusion of right knee, initial encounter: Secondary | ICD-10-CM | POA: Diagnosis not present

## 2020-05-27 DIAGNOSIS — R0789 Other chest pain: Secondary | ICD-10-CM | POA: Diagnosis not present

## 2020-05-27 DIAGNOSIS — Y939 Activity, unspecified: Secondary | ICD-10-CM | POA: Insufficient documentation

## 2020-05-27 DIAGNOSIS — Y999 Unspecified external cause status: Secondary | ICD-10-CM | POA: Insufficient documentation

## 2020-05-27 DIAGNOSIS — S8991XA Unspecified injury of right lower leg, initial encounter: Secondary | ICD-10-CM | POA: Diagnosis present

## 2020-05-27 DIAGNOSIS — R079 Chest pain, unspecified: Secondary | ICD-10-CM

## 2020-05-27 LAB — CBC WITH DIFFERENTIAL/PLATELET
Abs Immature Granulocytes: 0.02 10*3/uL (ref 0.00–0.07)
Basophils Absolute: 0.1 10*3/uL (ref 0.0–0.1)
Basophils Relative: 1 %
Eosinophils Absolute: 0.2 10*3/uL (ref 0.0–0.5)
Eosinophils Relative: 2 %
HCT: 40.3 % (ref 36.0–46.0)
Hemoglobin: 12.8 g/dL (ref 12.0–15.0)
Immature Granulocytes: 0 %
Lymphocytes Relative: 48 %
Lymphs Abs: 3.6 10*3/uL (ref 0.7–4.0)
MCH: 29.2 pg (ref 26.0–34.0)
MCHC: 31.8 g/dL (ref 30.0–36.0)
MCV: 92 fL (ref 80.0–100.0)
Monocytes Absolute: 0.4 10*3/uL (ref 0.1–1.0)
Monocytes Relative: 5 %
Neutro Abs: 3.4 10*3/uL (ref 1.7–7.7)
Neutrophils Relative %: 44 %
Platelets: 218 10*3/uL (ref 150–400)
RBC: 4.38 MIL/uL (ref 3.87–5.11)
RDW: 13.2 % (ref 11.5–15.5)
WBC: 7.7 10*3/uL (ref 4.0–10.5)
nRBC: 0 % (ref 0.0–0.2)

## 2020-05-27 LAB — D-DIMER, QUANTITATIVE: D-Dimer, Quant: 0.39 ug/mL-FEU (ref 0.00–0.50)

## 2020-05-27 NOTE — ED Notes (Signed)
ED Provider at bedside. 

## 2020-05-27 NOTE — ED Provider Notes (Signed)
[facility] EMERGENCY DEPARTMENT Provider Note   CSN: [id number] Arrival date & time: 05/27/20  2245   Time seen 11:08 PM  History Chief Complaint  Patient presents with  . Flank Pain    [name] is a 69 y.o. female.  HPI   Patient states she has had pain for the past 1-1/2 weeks that has been there constantly.  She states it starts underneath her right breast and radiates around into her right posterior chest and into her right posterior shoulder area.  When I reviewed her charts I saw that she had actually called into her PCP about this fall on June 28 stating she had fallen on June 24.  She had an outpatient chest x-ray done and then she went to urgent care and had right rib films done.  She tells me however she started having the pain before she fell.  She states that laughing, walking, and sneezing makes the pain worse.  Nothing makes it feel better.  She states the pain is constant.  She describes it as achy and sharp.  She denies nausea, vomiting, cough, shortness of breath, diaphoresis, fever, rhinorrhea, or sneezing.  She denies seeing a rash.  She states before this started most recently she has never had it before.  She states her mother has a history of congestive heart failure and DVT, the patient has never had DVT.  As far as risk factors she denies smoking, hypertension, or diabetes but she does have high cholesterol.  When asked what prompted her ED visit tonight she states about an hour prior to arrival the pain got a lot worse with breathing.  She states she was cooking and chopping up a lot of vegetables in preparation for tomorrow's holiday.  When asked to describe the fall that she had initially she said she had gone out to the mailbox and when she stepped up to get on the driveway the loose gravel gave way and she slipped and fell landing onto her right side.  PCP Chevis Pretty, FNP   History reviewed. No pertinent past medical history.  Patient Active  Problem List   Diagnosis Date Noted  . GAD (generalized anxiety disorder) 05/21/2019  . Well woman exam with routine gynecological exam 02/07/2017    Past Surgical History:  Procedure Laterality Date  . BACK SURGERY    . CESAREAN SECTION    . KNEE SURGERY     Bilaterally     OB History   No obstetric history on file.     Family History  Problem Relation Age of Onset  . Heart disease Mother   . Hypertension Mother   . Alcohol abuse Father   . Breast cancer Neg Hx     Social History   Tobacco Use  . Smoking status: Never Smoker  . Smokeless tobacco: Never Used  Vaping Use  . Vaping Use: Never used  Substance Use Topics  . Alcohol use: No  . Drug use: Never    Home Medications Prior to Admission medications   Medication Sig Start Date End Date Taking? Authorizing Provider  albuterol (PROVENTIL HFA;VENTOLIN HFA) 108 (90 Base) MCG/ACT inhaler Inhale 2 puffs into the lungs every 4 (four) hours as needed for wheezing or shortness of breath. 11/11/17   Terald Sleeper, PA-C  ALPRAZolam Duanne Moron) 0.5 MG tablet Take 1 tablet (0.5 mg total) by mouth 2 (two) times daily as needed for anxiety. 05/21/19   Chevis Pretty, FNP  erythromycin ophthalmic ointment Place  1 application into the left eye 3 (three) times daily. 01/14/20   Hassell Done, Mary-Margaret, FNP  escitalopram (LEXAPRO) 10 MG tablet Take 1 tablet (10 mg total) by mouth every evening. 05/21/19   Hassell Done, Mary-Margaret, FNP  fluticasone (FLONASE) 50 MCG/ACT nasal spray Place 2 sprays into both nostrils daily. 10/14/19   Sharion Balloon, FNP  predniSONE (STERAPRED UNI-PAK 21 TAB) 10 MG (21) TBPK tablet As directed x 6 days 02/08/20   Chevis Pretty, FNP  rosuvastatin (CRESTOR) 10 MG tablet Take 1 tablet (10 mg total) by mouth daily. 05/26/19   Chevis Pretty, FNP    Allergies    Bee venom  Review of Systems   Review of Systems  All other systems reviewed and are negative.   Physical Exam Updated Vital  Signs BP 122/64   Pulse 73   Temp 97.8 F (36.6 C) (Oral)   Resp 18   Ht 5\' 4"  (1.626 m)   Wt 82.1 kg   SpO2 99%   BMI 31.07 kg/m   Physical Exam Vitals and nursing note reviewed.  Constitutional:      General: She is not in acute distress.    Appearance: Normal appearance. She is normal weight. She is not ill-appearing or toxic-appearing.  HENT:     Head: Normocephalic and atraumatic.     Right Ear: External ear normal.     Left Ear: External ear normal.     Nose: Nose normal.     Mouth/Throat:     Mouth: Mucous membranes are moist.     Pharynx: No oropharyngeal exudate or posterior oropharyngeal erythema.  Eyes:     Extraocular Movements: Extraocular movements intact.     Conjunctiva/sclera: Conjunctivae normal.     Pupils: Pupils are equal, round, and reactive to light.  Cardiovascular:     Rate and Rhythm: Normal rate and regular rhythm.     Pulses: Normal pulses.     Heart sounds: No murmur heard.   Pulmonary:     Effort: Pulmonary effort is normal. No respiratory distress.     Breath sounds: Normal breath sounds.  Chest:     Chest wall: No tenderness.  Abdominal:     Palpations: Abdomen is soft.     Tenderness: There is no abdominal tenderness.  Musculoskeletal:        General: Normal range of motion.     Cervical back: Normal range of motion.     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  She has some small areas of purple bruising on her knees from the fall that she had on June 24.  Skin:    General: Skin is warm and dry.     Findings: No rash.  Neurological:     General: No focal deficit present.     Mental Status: She is alert and oriented to person, place, and time.     Cranial Nerves: No cranial nerve deficit.  Psychiatric:        Mood and Affect: Mood normal.        Behavior: Behavior normal.        Thought Content: Thought content normal.     ED Results / Procedures / Treatments    Labs (all labs ordered are listed, but only abnormal results are displayed) Results for orders placed or performed during the hospital encounter of 05/27/20  Comprehensive metabolic panel  Result Value Ref Range   Sodium 138 135 - 145 mmol/L  Potassium 3.8 3.5 - 5.1 mmol/L   Chloride 102 98 - 111 mmol/L   CO2 28 22 - 32 mmol/L   Glucose, Bld 99 70 - 99 mg/dL   BUN 17 8 - 23 mg/dL   Creatinine, Ser 0.82 0.44 - 1.00 mg/dL   Calcium 9.4 8.9 - 10.3 mg/dL   Total Protein 7.7 6.5 - 8.1 g/dL   Albumin 4.2 3.5 - 5.0 g/dL   AST 22 15 - 41 U/L   ALT 21 0 - 44 U/L   Alkaline Phosphatase 96 38 - 126 U/L   Total Bilirubin 0.5 0.3 - 1.2 mg/dL   GFR calc non Af Amer >60 >60 mL/min   GFR calc Af Amer >60 >60 mL/min   Anion gap 8 5 - 15  Lipase, blood  Result Value Ref Range   Lipase 28 11 - 51 U/L  CBC with Differential  Result Value Ref Range   WBC 7.7 4.0 - 10.5 K/uL   RBC 4.38 3.87 - 5.11 MIL/uL   Hemoglobin 12.8 12.0 - 15.0 g/dL   HCT 40.3 36 - 46 %   MCV 92.0 80.0 - 100.0 fL   MCH 29.2 26.0 - 34.0 pg   MCHC 31.8 30.0 - 36.0 g/dL   RDW 13.2 11.5 - 15.5 %   Platelets 218 150 - 400 K/uL   nRBC 0.0 0.0 - 0.2 %   Neutrophils Relative % 44 %   Neutro Abs 3.4 1.7 - 7.7 K/uL   Lymphocytes Relative 48 %   Lymphs Abs 3.6 0.7 - 4.0 K/uL   Monocytes Relative 5 %   Monocytes Absolute 0.4 0 - 1 K/uL   Eosinophils Relative 2 %   Eosinophils Absolute 0.2 0 - 0 K/uL   Basophils Relative 1 %   Basophils Absolute 0.1 0 - 0 K/uL   Immature Granulocytes 0 %   Abs Immature Granulocytes 0.02 0.00 - 0.07 K/uL  D-dimer, quantitative  Result Value Ref Range   D-Dimer, Quant 0.39 0.00 - 0.50 ug/mL-FEU  Urinalysis, Routine w reflex microscopic  Result Value Ref Range   Color, Urine STRAW (A) YELLOW   APPearance CLEAR CLEAR   Specific Gravity, Urine 1.005 1.005 - 1.030   pH 5.0 5.0 - 8.0   Glucose, UA NEGATIVE NEGATIVE mg/dL   Hgb urine dipstick SMALL (A) NEGATIVE   Bilirubin Urine NEGATIVE  NEGATIVE   Ketones, ur NEGATIVE NEGATIVE mg/dL   Protein, ur NEGATIVE NEGATIVE mg/dL   Nitrite NEGATIVE NEGATIVE   Leukocytes,Ua NEGATIVE NEGATIVE   RBC / HPF 0-5 0 - 5 RBC/hpf   WBC, UA 0-5 0 - 5 WBC/hpf   Bacteria, UA NONE SEEN NONE SEEN  Troponin I (High Sensitivity)  Result Value Ref Range   Troponin I (High Sensitivity) <2 <18 ng/L   Laboratory interpretation all normal except urine dip positive for small for blood, patient states she has kidney stones    EKG EKG Interpretation  Date/Time:  Saturday May 27 2020 23:01:19 EDT Ventricular Rate:  82 PR Interval:    QRS Duration: 100 QT Interval:  373 QTC Calculation: 436 R Axis:   77 Text Interpretation: Sinus rhythm Normal ECG No significant change since last tracing 24 Feb 2008 Confirmed by Rolland Porter [phone]) on 05/27/2020 11:20:58 PM   Radiology No results found.   Interface, Rad Results In - 05/22/2020 12:08 PM EDT  Formatting of this note might be different from the original. CLINICAL DATA: Right chest pain after a fall. Initial encounter.  EXAM: RIGHT RIBS - 2 VIEW  COMPARISON: PA and lateral chest 02/26/2018.  FINDINGS: No fracture or other bone lesions are seen involving the ribs.  IMPRESSION: Negative exam.   Electronically Signed By: Inge Rise M.D. On: 05/22/2020 12:06  Procedures Procedures (including critical care time)  Medications Ordered in ED Medications - No data to display  ED Course  I have reviewed the triage vital signs and the nursing notes.  Pertinent labs & imaging results that were available during my care of the patient were reviewed by me and considered in my medical decision making (see chart for details).    MDM Rules/Calculators/A&P                          I was going to give patient Toradol for her pain while waiting for her test to result however she has refused.  At time of discharge we discussed her test results which were all reassuring.  We discussed  getting a repeat x-ray to look for signs of a healing rib fracture that may not of been visible on her initial x-ray however she does not want to do that.  She states she has been taking ibuprofen 400 mg every 6-8 hours, we discussed increasing that to 600 mg.  She can try ice and heat.  At this time I can reassure her she does not have a heart attack, pulmonary embolus, pneumonia, or gallbladder problem.  I think she is out of the window to be concerned about shingles because her symptoms have been going on long enough that she should have a rash but now.  She states she has not noticed a worsening of her pain with eating.  She has a follow-up appointment with her primary care doctor on July 8.  We discussed this could be a thoracic nerve problem or most likely musculoskeletal.  She states she did help her husband unload watermelons a few days ago and in addition did a lot of the vegetable chopping tonight.  Patient states she does not want anything strong for pain.  Final Clinical Impression(s) / ED Diagnoses Final diagnoses:  Right-sided chest pain    Rx / DC Orders ED Discharge Orders    None      Plan discharge  Rolland Porter, MD, Barbette Or, MD 05/28/20 [phone]

## 2020-05-27 NOTE — ED Triage Notes (Signed)
Pt c/o right sided rib pain that radiates around to her back and to her shoulder x 1 week. Pt states it hurts worse when she takes a deep breath in.

## 2020-05-28 LAB — COMPREHENSIVE METABOLIC PANEL
ALT: 21 U/L (ref 0–44)
AST: 22 U/L (ref 15–41)
Albumin: 4.2 g/dL (ref 3.5–5.0)
Alkaline Phosphatase: 96 U/L (ref 38–126)
Anion gap: 8 (ref 5–15)
BUN: 17 mg/dL (ref 8–23)
CO2: 28 mmol/L (ref 22–32)
Calcium: 9.4 mg/dL (ref 8.9–10.3)
Chloride: 102 mmol/L (ref 98–111)
Creatinine, Ser: 0.82 mg/dL (ref 0.44–1.00)
GFR calc Af Amer: >60 mL/min (ref >60)
GFR calc non Af Amer: >60 mL/min (ref >60)
Glucose, Bld: 99 mg/dL (ref 70–99)
Potassium: 3.8 mmol/L (ref 3.5–5.1)
Sodium: 138 mmol/L (ref 135–145)
Total Bilirubin: 0.5 mg/dL (ref 0.3–1.2)
Total Protein: 7.7 g/dL (ref 6.5–8.1)

## 2020-05-28 LAB — URINALYSIS, ROUTINE W REFLEX MICROSCOPIC
Bacteria, UA: NONE SEEN
Bilirubin Urine: NEGATIVE
Glucose, UA: NEGATIVE mg/dL
Ketones, ur: NEGATIVE mg/dL
Leukocytes,Ua: NEGATIVE
Nitrite: NEGATIVE
Protein, ur: NEGATIVE mg/dL
Specific Gravity, Urine: 1.005 (ref 1.005–1.030)
pH: 5 (ref 5.0–8.0)

## 2020-05-28 LAB — TROPONIN I (HIGH SENSITIVITY): Troponin I (High Sensitivity): <2 ng/L (ref <18)

## 2020-05-28 LAB — LIPASE, BLOOD: Lipase: 28 U/L (ref 11–51)

## 2020-05-28 NOTE — Discharge Instructions (Addendum)
Try ice and heat for comfort.  Increase your ibuprofen to 600 mg every 6 hours.  Keep your appointment this week with your primary care doctor.  Return to the emergency department if you get fever, vomiting, struggle to breathe, or seem like you are worse.

## 2020-06-01 ENCOUNTER — Ambulatory Visit (INDEPENDENT_AMBULATORY_CARE_PROVIDER_SITE_OTHER): Payer: Medicare HMO | Admitting: Nurse Practitioner

## 2020-06-01 ENCOUNTER — Ambulatory Visit (INDEPENDENT_AMBULATORY_CARE_PROVIDER_SITE_OTHER): Payer: Medicare HMO

## 2020-06-01 ENCOUNTER — Encounter: Payer: Self-pay | Admitting: Nurse Practitioner

## 2020-06-01 ENCOUNTER — Other Ambulatory Visit: Payer: Self-pay

## 2020-06-01 VITALS — BP 148/79 | HR 77 | Temp 96.3°F | Resp 20 | Ht 64.0 in | Wt 186.0 lb

## 2020-06-01 DIAGNOSIS — Z1211 Encounter for screening for malignant neoplasm of colon: Secondary | ICD-10-CM | POA: Diagnosis not present

## 2020-06-01 DIAGNOSIS — Z0000 Encounter for general adult medical examination without abnormal findings: Secondary | ICD-10-CM

## 2020-06-01 DIAGNOSIS — F411 Generalized anxiety disorder: Secondary | ICD-10-CM

## 2020-06-01 DIAGNOSIS — Z1212 Encounter for screening for malignant neoplasm of rectum: Secondary | ICD-10-CM | POA: Diagnosis not present

## 2020-06-01 DIAGNOSIS — Z0001 Encounter for general adult medical examination with abnormal findings: Secondary | ICD-10-CM

## 2020-06-01 DIAGNOSIS — R69 Illness, unspecified: Secondary | ICD-10-CM | POA: Diagnosis not present

## 2020-06-01 DIAGNOSIS — Z139 Encounter for screening, unspecified: Secondary | ICD-10-CM | POA: Diagnosis not present

## 2020-06-01 DIAGNOSIS — E782 Mixed hyperlipidemia: Secondary | ICD-10-CM

## 2020-06-01 DIAGNOSIS — E785 Hyperlipidemia, unspecified: Secondary | ICD-10-CM | POA: Insufficient documentation

## 2020-06-01 MED ORDER — ROSUVASTATIN CALCIUM 10 MG PO TABS: 10.0000 mg | ORAL_TABLET | Freq: Every day | ORAL | 1 refills | Status: DC

## 2020-06-01 MED ORDER — ESCITALOPRAM OXALATE 10 MG PO TABS: 10.0000 mg | ORAL_TABLET | Freq: Every evening | ORAL | 1 refills | Status: DC

## 2020-06-01 MED ORDER — ALPRAZOLAM 0.5 MG PO TABS: 0.5000 mg | ORAL_TABLET | Freq: Two times a day (BID) | ORAL | 2 refills | Status: DC | PRN

## 2020-06-01 NOTE — Patient Instructions (Signed)

## 2020-06-01 NOTE — Progress Notes (Signed)
Subjective:    Patient ID: [name], female    DOB: 1951-05-02, 69 y.o.   MRN: [id number]   Chief Complaint: Annual Exam    HPI:  1. Annual physical exam Had a pap last June and was normal.  2. GAD (generalized anxiety disorder) Is on lexapro daily and is doing well. GAD 7 : Generalized Anxiety Score 05/21/2019  Nervous, Anxious, on Edge 1  Control/stop worrying 1  Worry too much - different things 1  Trouble relaxing 0  Restless 0  Easily annoyed or irritable 1  Afraid - awful might happen 0  Total GAD 7 Score 4  Anxiety Difficulty Not difficult at all     3. 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 Refuses to take crestor.  Lab Results  Component Value Date   CHOL 307 (H) 05/21/2019   HDL 46 05/21/2019   LDLCALC 218 (H) 05/21/2019   TRIG 217 (H) 05/21/2019   CHOLHDL 6.7 (H) 05/21/2019   BP Readings from Last 3 Encounters:  06/01/20 (!) 148/79  05/28/20 122/64  05/21/19 124/75     Outpatient Encounter Medications as of 06/01/2020  Medication Sig   albuterol (PROVENTIL HFA;VENTOLIN HFA) 108 (90 Base) MCG/ACT inhaler Inhale 2 puffs into the lungs every 4 (four) hours as needed for wheezing or shortness of breath.   ALPRAZolam (XANAX) 0.5 MG tablet Take 1 tablet (0.5 mg total) by mouth 2 (two) times daily as needed for anxiety.   erythromycin ophthalmic ointment Place 1 application into the left eye 3 (three) times daily.   escitalopram (LEXAPRO) 10 MG tablet Take 1 tablet (10 mg total) by mouth every evening.   fluticasone (FLONASE) 50 MCG/ACT nasal spray Place 2 sprays into both nostrils daily.   predniSONE (STERAPRED UNI-PAK 21 TAB) 10 MG (21) TBPK tablet As directed x 6 days   rosuvastatin (CRESTOR) 10 MG tablet Take 1 tablet (10 mg total) by mouth daily.     Past Surgical History:  Procedure Laterality Date   BACK SURGERY     CESAREAN SECTION     KNEE SURGERY       Bilaterally    Family History  Problem Relation Age of Onset   Heart disease Mother    Hypertension Mother    Alcohol abuse Father    Breast cancer Neg Hx     New complaints: Had to go to the ER sat night with pain right flank. They did not do a scan and said they think may be from a fall.  Social history: Lives with husband on family farm.  Controlled substance contract: n/a   Review of Systems  Constitutional: Negative for diaphoresis.  Eyes: Negative for pain.  Respiratory: Negative for shortness of breath.   Cardiovascular: Negative for chest pain, palpitations and leg swelling.  Gastrointestinal: Negative for abdominal pain.  Endocrine: Negative for polydipsia.  Skin: Negative for rash.  Neurological: Negative for dizziness, weakness and headaches.  Hematological: Does not bruise/bleed easily.  All other systems reviewed and are negative.      Objective:   Physical Exam Vitals and nursing note reviewed.  Constitutional:      General: She is not in acute distress.    Appearance: Normal appearance. She is well-developed.  HENT:     Head: Normocephalic.     Nose: Nose normal.  Eyes:     Pupils: Pupils are equal, round, and reactive to light.  Neck:     Vascular: No carotid bruit or JVD.  Cardiovascular:     Rate and Rhythm: Normal rate and regular rhythm.     Heart sounds: Normal heart sounds.  Pulmonary:     Effort: Pulmonary effort is normal. No respiratory distress.     Breath sounds: Normal breath sounds. No wheezing or rales.  Chest:     Chest wall: No tenderness.  Abdominal:     General: Bowel sounds are normal. There is no distension or abdominal bruit.     Palpations: Abdomen is soft. There is no hepatomegaly, splenomegaly, mass or pulsatile mass.     Tenderness: There is no abdominal tenderness.  Musculoskeletal:        General: Normal range of motion.     Cervical back: Normal range of motion and neck supple.  Lymphadenopathy:      Cervical: No cervical adenopathy.  Skin:    General: Skin is warm and dry.  Neurological:     Mental Status: She is alert and oriented to person, place, and time.     Deep Tendon Reflexes: Reflexes are normal and symmetric.  Psychiatric:        Behavior: Behavior normal.        Thought Content: Thought content normal.        Judgment: Judgment normal.    BP (!) 148/79    Pulse 77    Temp (!) 96.3 F (35.7 C) (Temporal)    Resp 20    Ht _0  (1.626 m)    Wt 186 lb (84.4 kg)    SpO2 94%    BMI 31.93 kg/m         Assessment & Plan:  [name] comes in today with chief complaint of Annual Exam   Diagnosis and orders addressed:  1. Annual physical exam - CBC with Differential/Platelet - Thyroid Panel With TSH  2. GAD (generalized anxiety disorder) Stress management - escitalopram (LEXAPRO) 10 MG tablet; Take 1 tablet (10 mg total) by mouth every evening.  Dispense: 90 tablet; Refill: 1 - ALPRAZolam (XANAX) 0.5 MG tablet; Take 1 tablet (0.5 mg total) by mouth 2 (two) times daily as needed for anxiety.  Dispense: 60 tablet; Refill: 2  3. Mixed hyperlipidemia Low fat diet - rosuvastatin (CRESTOR) 10 MG tablet; Take 1 tablet (10 mg total) by mouth daily.  Dispense: 90 tablet; Refill: 1 - CMP14+EGFR - Lipid panel   Labs pending Health Maintenance reviewed- ordered cologuard  Diet and exercise encouraged  Follow up plan: 60 months   [place], FNP

## 2020-06-02 LAB — CBC WITH DIFFERENTIAL/PLATELET
Basophils Absolute: 0.1 10*3/uL (ref 0.0–0.2)
Basos: 1 %
EOS (ABSOLUTE): 0.2 10*3/uL (ref 0.0–0.4)
Eos: 2 %
Hematocrit: 40 % (ref 34.0–46.6)
Hemoglobin: 13.6 g/dL (ref 11.1–15.9)
Immature Grans (Abs): 0 10*3/uL (ref 0.0–0.1)
Immature Granulocytes: 0 %
Lymphocytes Absolute: 4.1 10*3/uL — ABNORMAL HIGH (ref 0.7–3.1)
Lymphs: 44 %
MCH: 29.7 pg (ref 26.6–33.0)
MCHC: 34 g/dL (ref 31.5–35.7)
MCV: 87 fL (ref 79–97)
Monocytes Absolute: 0.5 10*3/uL (ref 0.1–0.9)
Monocytes: 6 %
Neutrophils Absolute: 4.4 10*3/uL (ref 1.4–7.0)
Neutrophils: 47 %
Platelets: 260 10*3/uL (ref 150–450)
RBC: 4.58 x10E6/uL (ref 3.77–5.28)
RDW: 13.5 % (ref 11.7–15.4)
WBC: 9.3 10*3/uL (ref 3.4–10.8)

## 2020-06-02 LAB — LIPID PANEL
Chol/HDL Ratio: 5 ratio — ABNORMAL HIGH (ref 0.0–4.4)
Cholesterol, Total: 248 mg/dL — ABNORMAL HIGH (ref 100–199)
HDL: 50 mg/dL (ref >39)
LDL Chol Calc (NIH): 142 mg/dL — ABNORMAL HIGH (ref 0–99)
Triglycerides: 307 mg/dL — ABNORMAL HIGH (ref 0–149)
VLDL Cholesterol Cal: 56 mg/dL — ABNORMAL HIGH (ref 5–40)

## 2020-06-02 LAB — CMP14+EGFR
ALT: 20 IU/L (ref 0–32)
AST: 22 IU/L (ref 0–40)
Albumin/Globulin Ratio: 1.7 (ref 1.2–2.2)
Albumin: 4.3 g/dL (ref 3.8–4.8)
Alkaline Phosphatase: 125 IU/L — ABNORMAL HIGH (ref 48–121)
BUN/Creatinine Ratio: 16 (ref 12–28)
BUN: 12 mg/dL (ref 8–27)
Bilirubin Total: 0.3 mg/dL (ref 0.0–1.2)
CO2: 25 mmol/L (ref 20–29)
Calcium: 9 mg/dL (ref 8.7–10.3)
Chloride: 99 mmol/L (ref 96–106)
Creatinine, Ser: 0.74 mg/dL (ref 0.57–1.00)
GFR calc Af Amer: 98 mL/min/{1.73_m2} (ref >59)
GFR calc non Af Amer: 85 mL/min/{1.73_m2} (ref >59)
Globulin, Total: 2.5 g/dL (ref 1.5–4.5)
Glucose: 83 mg/dL (ref 65–99)
Potassium: 4.3 mmol/L (ref 3.5–5.2)
Sodium: 139 mmol/L (ref 134–144)
Total Protein: 6.8 g/dL (ref 6.0–8.5)

## 2020-06-02 LAB — THYROID PANEL WITH TSH
Free Thyroxine Index: 2 (ref 1.2–4.9)
T3 Uptake Ratio: 26 % (ref 24–39)
T4, Total: 7.8 ug/dL (ref 4.5–12.0)
TSH: 2.68 u[IU]/mL (ref 0.450–4.500)

## 2020-06-02 MED ORDER — FENOFIBRATE 145 MG PO TABS: 145.0000 mg | ORAL_TABLET | Freq: Every day | ORAL | 5 refills | Status: DC

## 2020-06-02 NOTE — Addendum Note (Signed)
Addended by: Chevis Pretty on: 06/02/2020 10:59 AM   Modules accepted: Orders

## 2020-06-12 ENCOUNTER — Other Ambulatory Visit: Payer: Self-pay | Admitting: Nurse Practitioner

## 2020-06-12 MED ORDER — BENZONATATE 100 MG PO CAPS: 100.0000 mg | ORAL_CAPSULE | Freq: Three times a day (TID) | ORAL | 0 refills | Status: DC | PRN

## 2020-06-14 DIAGNOSIS — Z1212 Encounter for screening for malignant neoplasm of rectum: Secondary | ICD-10-CM | POA: Diagnosis not present

## 2020-06-22 LAB — COLOGUARD: Cologuard: NEGATIVE

## 2020-08-14 ENCOUNTER — Other Ambulatory Visit: Payer: Self-pay | Admitting: Nurse Practitioner

## 2020-08-14 DIAGNOSIS — Z1231 Encounter for screening mammogram for malignant neoplasm of breast: Secondary | ICD-10-CM

## 2020-08-15 ENCOUNTER — Other Ambulatory Visit: Payer: Self-pay | Admitting: Nurse Practitioner

## 2020-08-15 DIAGNOSIS — M65331 Trigger finger, right middle finger: Secondary | ICD-10-CM

## 2020-08-17 ENCOUNTER — Ambulatory Visit (INDEPENDENT_AMBULATORY_CARE_PROVIDER_SITE_OTHER): Payer: Medicare HMO | Admitting: Nurse Practitioner

## 2020-08-17 ENCOUNTER — Encounter: Payer: Self-pay | Admitting: Nurse Practitioner

## 2020-08-17 DIAGNOSIS — J301 Allergic rhinitis due to pollen: Secondary | ICD-10-CM | POA: Diagnosis not present

## 2020-08-17 MED ORDER — PREDNISONE 20 MG PO TABS: ORAL_TABLET | ORAL | 0 refills | Status: DC

## 2020-08-17 NOTE — Progress Notes (Signed)
   Virtual Visit via telephone Note Due to COVID-19 pandemic this visit was conducted virtually. This visit type was conducted due to national recommendations for restrictions regarding the COVID-19 Pandemic (e.g. social distancing, sheltering in place) in an effort to limit this patient's exposure and mitigate transmission in our community. All issues noted in this document were discussed and addressed.  A physical exam was not performed with this format.  I connected with [name] on 08/17/20 at 11:30 by telephone and verified that I am speaking with the correct person using two identifiers. [name] is currently located at home and no one is currently with her during visit. The provider, Mary-Margaret Hassell Done, FNP is located in their office at time of visit.  I discussed the limitations, risks, security and privacy concerns of performing an evaluation and management service by telephone and the availability of in person appointments. I also discussed with the patient that there may be a patient responsible charge related to this service. The patient expressed understanding and agreed to proceed.   History and Present Illness:   Chief Complaint: Sinusitis   HPI Patient calls in today c/o sore throat and congested- thinks it is coming form her allergies. She is taking sudafed.   Review of Systems  Constitutional: Positive for chills. Negative for fever.  HENT: Positive for congestion.   Respiratory: Negative.   Genitourinary: Negative.   Skin: Negative.   Neurological: Negative.   Psychiatric/Behavioral: Negative.      Observations/Objective: Alert and oriented- answers all questions appropriately No distress     Assessment and Plan: [name] in today with chief complaint of Sinusitis   1. Seasonal allergic rhinitis due to pollen Continue flonase Continue zyrtec Meds ordered this encounter  Medications  . predniSONE (DELTASONE) 20 MG tablet    Sig: 2 po at  sametime daily for 5 days    Dispense:  10 tablet    Refill:  0    Order Specific Question:   Supervising Provider    Answer:   Caryl Pina A [[id number]]       Follow Up Instructions: prn    I discussed the assessment and treatment plan with the patient. The patient was provided an opportunity to ask questions and all were answered. The patient agreed with the plan and demonstrated an understanding of the instructions.   The patient was advised to call back or seek an in-person evaluation if the symptoms worsen or if the condition fails to improve as anticipated.  The above assessment and management plan was discussed with the patient. The patient verbalized understanding of and has agreed to the management plan. Patient is aware to call the clinic if symptoms persist or worsen. Patient is aware when to return to the clinic for a follow-up visit. Patient educated on when it is appropriate to go to the emergency department.   Time call ended:  11:45  I provided 15 minutes of non-face-to-face time during this encounter.    Mary-Margaret Hassell Done, FNP

## 2020-08-21 ENCOUNTER — Telehealth: Payer: Self-pay

## 2020-08-21 MED ORDER — AMOXICILLIN-POT CLAVULANATE 875-125 MG PO TABS: 1.0000 | ORAL_TABLET | Freq: Two times a day (BID) | ORAL | 0 refills | Status: DC

## 2020-08-21 NOTE — Telephone Encounter (Signed)
[facility]  SWITCHBOARD  SICK CALL SCREENING   08/21/2020  [name]    CVK:[id number]    DOB:[birth year]  Best Contact Telephone Number: [phone]   5. Symptoms: cough, Sore throat, coughing up thick mucus. Patient asking for antibiotic   2. Symptom Onset: last week   3. Have you traveled any over the past 14 days? No  4.   Have you been in recent contact with someone that has tested positive for COVID-19? No  5.   Which pharmacy would you use today if given a prescription? [facility] was informed that this information will be given to a clinical staff member to review and that they should receive a follow-up telephone call within 24 hours. she was advised to call back if she develops any new symptoms or if her current symptoms worsen.   Screened by: Janett Billow

## 2020-08-21 NOTE — Addendum Note (Signed)
Addended by: Chevis Pretty on: 08/21/2020 04:55 PM   Modules accepted: Orders

## 2020-09-06 DIAGNOSIS — M65331 Trigger finger, right middle finger: Secondary | ICD-10-CM | POA: Diagnosis not present

## 2020-09-11 ENCOUNTER — Encounter: Payer: Self-pay | Admitting: Nurse Practitioner

## 2020-09-11 ENCOUNTER — Ambulatory Visit (INDEPENDENT_AMBULATORY_CARE_PROVIDER_SITE_OTHER): Payer: Medicare HMO | Admitting: Nurse Practitioner

## 2020-09-11 DIAGNOSIS — J0101 Acute recurrent maxillary sinusitis: Secondary | ICD-10-CM

## 2020-09-11 MED ORDER — AMOXICILLIN-POT CLAVULANATE 875-125 MG PO TABS: 1.0000 | ORAL_TABLET | Freq: Two times a day (BID) | ORAL | 0 refills | Status: DC

## 2020-09-11 MED ORDER — PREDNISONE 20 MG PO TABS: ORAL_TABLET | ORAL | 0 refills | Status: DC

## 2020-09-11 NOTE — Progress Notes (Signed)
Virtual Visit via telephone Note Due to COVID-19 pandemic this visit was conducted virtually. This visit type was conducted due to national recommendations for restrictions regarding the COVID-19 Pandemic (e.g. social distancing, sheltering in place) in an effort to limit this patient's exposure and mitigate transmission in our community. All issues noted in this document were discussed and addressed.  A physical exam was not performed with this format.  I connected with [name] on 09/11/20 at 3:35 by telephone and verified that I am speaking with the correct person using two identifiers. [name] is currently located at home and no one is currently with her during visit. The provider, Mary-Margaret Hassell Done, FNP is located in their office at time of visit.  I discussed the limitations, risks, security and privacy concerns of performing an evaluation and management service by telephone and the availability of in person appointments. I also discussed with the patient that there may be a patient responsible charge related to this service. The patient expressed understanding and agreed to proceed.   History and Present Illness:   Chief Complaint: Sinusitis   HPI patient calls in for telephone visit c/o nasal drainage which is clear. Constant drip down her back of her nose. Her teeth hurt to chew and she has lots of pressure in her face.   Review of Systems  Constitutional: Negative for chills and fever.  HENT: Positive for congestion, ear pain and sinus pain. Negative for ear discharge.   Respiratory: Positive for cough.   Neurological: Positive for headaches.  Psychiatric/Behavioral: Negative.   All other systems reviewed and are negative.    Observations/Objective: Alert and oriented- answers all questions appropriately No distress Voice hoarse Dry cough noted   Assessment and Plan: [name] in today with chief complaint of Sinusitis   1. Acute recurrent maxillary  sinusitis 1. Take meds as prescribed 2. Use a cool mist humidifier especially during the winter months and when heat has been humid. 3. Use saline nose sprays frequently 4. Saline irrigations of the nose can be very helpful if done frequently.  * 4X daily for 1 week*  * Use of a nettie pot can be helpful with this. Follow directions with this* 5. Drink plenty of fluids 6. Keep thermostat turn down low 7.For any cough or congestion  Use plain Mucinex- regular strength or max strength is fine   * Children- consult with Pharmacist for dosing 8. For fever or aces or pains- take tylenol or ibuprofen appropriate for age and weight.  * for fevers greater than 101 orally you may alternate ibuprofen and tylenol every  3 hours.   Meds ordered this encounter  Medications   amoxicillin-clavulanate (AUGMENTIN) 875-125 MG tablet    Sig: Take 1 tablet by mouth 2 (two) times daily.    Dispense:  14 tablet    Refill:  0    Order Specific Question:   Supervising Provider    Answer:   Caryl Pina A [[id number]]   predniSONE (DELTASONE) 20 MG tablet    Sig: 2 po at sametime daily for 5 days    Dispense:  10 tablet    Refill:  0    Order Specific Question:   Supervising Provider    Answer:   Caryl Pina A [[id number]]        Follow Up Instructions: prn    I discussed the assessment and treatment plan with the patient. The patient was provided an opportunity to ask questions and all  were answered. The patient agreed with the plan and demonstrated an understanding of the instructions.   The patient was advised to call back or seek an in-person evaluation if the symptoms worsen or if the condition fails to improve as anticipated.  The above assessment and management plan was discussed with the patient. The patient verbalized understanding of and has agreed to the management plan. Patient is aware to call the clinic if symptoms persist or worsen. Patient is aware when to return to the  clinic for a follow-up visit. Patient educated on when it is appropriate to go to the emergency department.   Time call ended:  3:48  I provided 13 minutes of non-face-to-face time during this encounter.    Mary-Margaret Hassell Done, FNP

## 2020-09-14 ENCOUNTER — Other Ambulatory Visit: Payer: Self-pay

## 2020-09-14 ENCOUNTER — Ambulatory Visit
Admission: RE | Admit: 2020-09-14 | Discharge: 2020-09-14 | Disposition: A | Discharge status: Home / Self Care | Payer: Medicare HMO | Source: Ambulatory Visit | Attending: Nurse Practitioner | Admitting: Nurse Practitioner

## 2020-09-14 DIAGNOSIS — Z1231 Encounter for screening mammogram for malignant neoplasm of breast: Secondary | ICD-10-CM

## 2020-12-14 ENCOUNTER — Ambulatory Visit: Payer: Medicare HMO | Admitting: Nurse Practitioner

## 2021-01-11 DIAGNOSIS — L7211 Pilar cyst: Secondary | ICD-10-CM | POA: Diagnosis not present

## 2021-01-11 DIAGNOSIS — L820 Inflamed seborrheic keratosis: Secondary | ICD-10-CM | POA: Diagnosis not present

## 2021-02-09 ENCOUNTER — Encounter (HOSPITAL_COMMUNITY): Payer: Self-pay

## 2021-02-09 ENCOUNTER — Encounter: Payer: Self-pay | Admitting: Nurse Practitioner

## 2021-02-09 ENCOUNTER — Ambulatory Visit (INDEPENDENT_AMBULATORY_CARE_PROVIDER_SITE_OTHER): Payer: Medicare HMO | Admitting: Nurse Practitioner

## 2021-02-09 ENCOUNTER — Emergency Department (HOSPITAL_COMMUNITY)
Admission: EM | Admit: 2021-02-09 | Discharge: 2021-02-09 | Disposition: A | Discharge status: Home / Self Care | Payer: Medicare HMO | Attending: Emergency Medicine | Admitting: Emergency Medicine

## 2021-02-09 ENCOUNTER — Other Ambulatory Visit: Payer: Self-pay

## 2021-02-09 DIAGNOSIS — S0181XA Laceration without foreign body of other part of head, initial encounter: Secondary | ICD-10-CM | POA: Insufficient documentation

## 2021-02-09 DIAGNOSIS — Z23 Encounter for immunization: Secondary | ICD-10-CM | POA: Insufficient documentation

## 2021-02-09 DIAGNOSIS — H1032 Unspecified acute conjunctivitis, left eye: Secondary | ICD-10-CM

## 2021-02-09 DIAGNOSIS — W540XXA Bitten by dog, initial encounter: Secondary | ICD-10-CM | POA: Insufficient documentation

## 2021-02-09 DIAGNOSIS — S0993XA Unspecified injury of face, initial encounter: Secondary | ICD-10-CM | POA: Diagnosis present

## 2021-02-09 DIAGNOSIS — Y92009 Unspecified place in unspecified non-institutional (private) residence as the place of occurrence of the external cause: Secondary | ICD-10-CM | POA: Diagnosis not present

## 2021-02-09 DIAGNOSIS — Y93K9 Activity, other involving animal care: Secondary | ICD-10-CM | POA: Insufficient documentation

## 2021-02-09 DIAGNOSIS — S0185XA Open bite of other part of head, initial encounter: Secondary | ICD-10-CM

## 2021-02-09 MED ORDER — AMOXICILLIN-POT CLAVULANATE 875-125 MG PO TABS: 1.0000 | ORAL_TABLET | Freq: Two times a day (BID) | ORAL | 0 refills | Status: DC

## 2021-02-09 MED ORDER — AMOXICILLIN-POT CLAVULANATE 875-125 MG PO TABS
1.0000 | ORAL_TABLET | Freq: Once | ORAL | Status: AC
  Administered 2021-02-09: 1 via ORAL
  Filled 2021-02-09: qty 1

## 2021-02-09 MED ORDER — POLYMYXIN B-TRIMETHOPRIM 10000-0.1 UNIT/ML-% OP SOLN: 2.0000 [drp] | OPHTHALMIC | 0 refills | Status: DC

## 2021-02-09 MED ORDER — TETANUS-DIPHTH-ACELL PERTUSSIS 5-2.5-18.5 LF-MCG/0.5 IM SUSY
0.5000 mL | PREFILLED_SYRINGE | Freq: Once | INTRAMUSCULAR | Status: AC
  Administered 2021-02-09: 0.5 mL via INTRAMUSCULAR
  Filled 2021-02-09: qty 0.5

## 2021-02-09 NOTE — Progress Notes (Signed)
   Virtual Visit via telephone Note Due to COVID-19 pandemic this visit was conducted virtually. This visit type was conducted due to national recommendations for restrictions regarding the COVID-19 Pandemic (e.g. social distancing, sheltering in place) in an effort to limit this patient's exposure and mitigate transmission in our community. All issues noted in this document were discussed and addressed.  A physical exam was not performed with this format.  I connected with [name] on 02/09/21 at 9:04 by telephone and verified that I am speaking with the correct person using two identifiers. [name] is currently located at home and her grand children are currently with her during visit. The provider, Mary-Margaret Hassell Done, FNP is located in their office at time of visit.  I discussed the limitations, risks, security and privacy concerns of performing an evaluation and management service by telephone and the availability of in person appointments. I also discussed with the patient that there may be a patient responsible charge related to this service. The patient expressed understanding and agreed to proceed.   History and Present Illness:   Chief Complaint: Conjunctivitis   HPI Patient calls in for telephone visit stating that her woke up left eye red , itchy and matted together this morning.   Review of Systems  Constitutional: Negative.   HENT: Negative.   Eyes: Positive for discharge and redness.  Cardiovascular: Negative.   Skin: Negative.   Neurological: Negative.   Psychiatric/Behavioral: Negative.   All other systems reviewed and are negative.    Observations/Objective: Alert and oriented- answers all questions appropriately No distress  Sclera erythematous with yellowish exudate in corner of eye  Assessment and Plan: [name] in today with chief complaint of Conjunctivitis   1. Acute bacterial conjunctivitis of left eye Good handwashing No  rubbing Treat both eyes - trimethoprim-polymyxin b (POLYTRIM) ophthalmic solution; Place 2 drops into both eyes every 4 (four) hours.  Dispense: 10 mL; Refill: 0     Follow Up Instructions: prn    I discussed the assessment and treatment plan with the patient. The patient was provided an opportunity to ask questions and all were answered. The patient agreed with the plan and demonstrated an understanding of the instructions.   The patient was advised to call back or seek an in-person evaluation if the symptoms worsen or if the condition fails to improve as anticipated.  The above assessment and management plan was discussed with the patient. The patient verbalized understanding of and has agreed to the management plan. Patient is aware to call the clinic if symptoms persist or worsen. Patient is aware when to return to the clinic for a follow-up visit. Patient educated on when it is appropriate to go to the emergency department.   Time call ended:  9:16  I provided 12 minutes of non-face-to-face time during this encounter.    Mary-Margaret Hassell Done, FNP

## 2021-02-09 NOTE — ED Triage Notes (Signed)
Pt to room from triage with reports of a dog bite.  Pt reports that she was playing with her dog, had a piece of food in mouth and dog went to eat the piece of food.  Pt presents to triage with a large bit wound to right lower lip/chin.  Bleeding noted in triage, but controlled with pressure.  Reports being unsure of last tetanus shot, reports that dog has had a rabies shot but is currently late on updated vaccine.  Pt denies pain during triage, denies a history of hypertension.   PA at bedside at this time.

## 2021-02-09 NOTE — ED Notes (Signed)
   02/09/21 1439  Patient's Weight  Weight 83.9 kg  Weight Method Stated  Animal Incident  Animal Incident Bite  Injuries Bite(Laceration)  Stage manager Phone [phone]  Animal Control Contacted Yes  Animal Captured? No  Animal Vaccinated? Unsure  Animal Known Yes  Animal Killed? No  Date, Time and County February 09, 2021, 1447, [place]

## 2021-02-09 NOTE — ED Notes (Signed)
Wet to dry dressing applied to bite, patient transfer POV to ENT provider at this time.

## 2021-02-09 NOTE — Discharge Instructions (Signed)
Keep the dressing in place.  Please go to Dr. Sander Radon office who will take care of of the laceration for you.

## 2021-02-09 NOTE — ED Provider Notes (Signed)
[facility] EMERGENCY DEPARTMENT Provider Note   CSN: [id number] Arrival date & time: 02/09/21  1416     History Chief Complaint  Patient presents with  . Animal Bite    [name] is a 69 y.o. female.  HPI      [name] is a 70 y.o. female who presents to the Emergency Department complaining of dog bite to her right lower face.  She states that she was playing with her dog and had a piece of bacon in her mouth and the dog jumped up to get the bacon and bit her face.  Incident occurred approximately 1 hour prior to arrival.  Bleeding has been controlled with direct pressure.  Last tetanus is unknown.  She states the dog is a house dog and is not currently up-to-date on rabies vaccinations.  She denies numbness or pain of her face, dental injury, or injuries inside the mouth.  She does not take any anticoagulants.    History reviewed. No pertinent past medical history.  Patient Active Problem List   Diagnosis Date Noted  . Hyperlipidemia 06/01/2020  . GAD (generalized anxiety disorder) 05/21/2019  . Well woman exam with routine gynecological exam 02/07/2017    Past Surgical History:  Procedure Laterality Date  . BACK SURGERY    . CESAREAN SECTION    . KNEE SURGERY     Bilaterally     OB History   No obstetric history on file.     Family History  Problem Relation Age of Onset  . Heart disease Mother   . Hypertension Mother   . Alcohol abuse Father   . Breast cancer Neg Hx     Social History   Tobacco Use  . Smoking status: Never Smoker  . Smokeless tobacco: Never Used  Vaping Use  . Vaping Use: Never used  Substance Use Topics  . Alcohol use: No  . Drug use: Never    Home Medications Prior to Admission medications   Medication Sig Start Date End Date Taking? Authorizing Provider  albuterol (PROVENTIL HFA;VENTOLIN HFA) 108 (90 Base) MCG/ACT inhaler Inhale 2 puffs into the lungs every 4 (four) hours as needed for wheezing or shortness of breath.  11/11/17   Terald Sleeper, PA-C  ALPRAZolam Duanne Moron) 0.5 MG tablet Take 1 tablet (0.5 mg total) by mouth 2 (two) times daily as needed for anxiety. 06/01/20   Hassell Done Mary-Margaret, FNP  amoxicillin-clavulanate (AUGMENTIN) 875-125 MG tablet Take 1 tablet by mouth 2 (two) times daily. 09/11/20   Hassell Done, Mary-Margaret, FNP  benzonatate (TESSALON PERLES) 100 MG capsule Take 1 capsule (100 mg total) by mouth 3 (three) times daily as needed for cough. 06/12/20   Hassell Done, Mary-Margaret, FNP  escitalopram (LEXAPRO) 10 MG tablet Take 1 tablet (10 mg total) by mouth every evening. 06/01/20   Hassell Done, Mary-Margaret, FNP  fenofibrate (TRICOR) 145 MG tablet Take 1 tablet (145 mg total) by mouth daily. 06/02/20   Hassell Done, Mary-Margaret, FNP  fluticasone (FLONASE) 50 MCG/ACT nasal spray Place 2 sprays into both nostrils daily. 10/14/19   Sharion Balloon, FNP  predniSONE (DELTASONE) 20 MG tablet 2 po at sametime daily for 5 days 09/11/20   Chevis Pretty, FNP  rosuvastatin (CRESTOR) 10 MG tablet Take 1 tablet (10 mg total) by mouth daily. 06/01/20   Hassell Done Mary-Margaret, FNP  trimethoprim-polymyxin b (POLYTRIM) ophthalmic solution Place 2 drops into both eyes every 4 (four) hours. 02/09/21   Chevis Pretty, FNP    Allergies  Bee venom  Review of Systems   Review of Systems  Constitutional: Negative for chills and fever.  HENT:       Laceration right lower face  Musculoskeletal: Negative for arthralgias, back pain and joint swelling.  Neurological: Negative for dizziness, weakness and numbness.  Hematological: Does not bruise/bleed easily.    Physical Exam Updated Vital Signs BP (!) 184/87 (BP Location: Left Arm)   Pulse (!) 106   Temp 98.1 F (36.7 C) (Oral)   Resp 20   Ht 5\' 5"  (1.651 m)   Wt 83.9 kg   SpO2 99%   BMI 30.78 kg/m   Physical Exam Constitutional:      Appearance: Normal appearance.  HENT:     Head: Normocephalic.     Mouth/Throat:     Mouth: Mucous membranes are moist.      Dentition: No dental tenderness or gingival swelling.     Pharynx: Oropharynx is clear.     Comments: 4 cm irregular laceration the right lower face.  Laceration involves the vermilion border, but does not extend into the oral mucosa.  No dental injury.  No malocclusion.  Bleeding is controlled.  See attached photo Eyes:     Pupils: Pupils are equal, round, and reactive to light.  Neck:     Thyroid: No thyromegaly.     Meningeal: Kernig's sign absent.  Cardiovascular:     Rate and Rhythm: Normal rate and regular rhythm.     Heart sounds: Normal heart sounds.  Pulmonary:     Effort: Pulmonary effort is normal.     Breath sounds: Normal breath sounds. No wheezing.  Abdominal:     Palpations: Abdomen is soft.     Tenderness: There is no abdominal tenderness. There is no guarding or rebound.  Musculoskeletal:        General: Normal range of motion.     Cervical back: Normal range of motion and neck supple.  Skin:    General: Skin is warm.     Findings: No rash.  Neurological:     Mental Status: She is alert and oriented to person, place, and time.       Patient gave verbal consent for images to be stored in medical record.  ED Results / Procedures / Treatments   Labs (all labs ordered are listed, but only abnormal results are displayed) Labs Reviewed - No data to display  EKG None  Radiology No results found.  Procedures Procedures   Medications Ordered in ED Medications  Tdap (BOOSTRIX) injection 0.5 mL (has no administration in time range)  amoxicillin-clavulanate (AUGMENTIN) 875-125 MG per tablet 1 tablet (has no administration in time range)    ED Course  I have reviewed the triage vital signs and the nursing notes.  Pertinent labs & imaging results that were available during my care of the patient were reviewed by me and considered in my medical decision making (see chart for details).    MDM Rules/Calculators/A&P                          Patient  here with the laceration of her right lower face secondary to a dog bite.  She is the owner of the dog and dog is kept in the house. It is not currently up-to-date on rabies vaccinations.  Patient's last tetanus unknown.  Bleeding controlled at this time, laceration is irregular and not well approximated.  I feel that she would benefit from  evaluation by facial trauma specialist   Animal control notified and has taken report.  Dog will be quarantined within the home, will hold rabies protocol since dog is contained within the home.  Wound cleaned with saline, Td updated and Augmentin initiated.  Pt also seen by Dr. Roderic Palau and care plan discussed.   1510  Discussed findings with Dr. Mancel Parsons who agrees to see pt in his office today for further wound care.    Final Clinical Impression(s) / ED Diagnoses Final diagnoses:  Dog bite of face, initial encounter    Rx / DC Orders ED Discharge Orders    None       Bufford Lope 02/09/21 Colon Flattery, MD 02/10/21 1541

## 2021-02-09 NOTE — ED Notes (Signed)
Animal Control at bedside at this time.

## 2021-02-12 ENCOUNTER — Other Ambulatory Visit: Payer: Self-pay | Admitting: Nurse Practitioner

## 2021-02-12 ENCOUNTER — Other Ambulatory Visit: Payer: Self-pay

## 2021-02-12 MED ORDER — AMOXICILLIN-POT CLAVULANATE 875-125 MG PO TABS: 1.0000 | ORAL_TABLET | Freq: Two times a day (BID) | ORAL | 0 refills | Status: DC

## 2021-04-05 ENCOUNTER — Encounter: Payer: Self-pay | Admitting: Nurse Practitioner

## 2021-04-05 ENCOUNTER — Ambulatory Visit (INDEPENDENT_AMBULATORY_CARE_PROVIDER_SITE_OTHER): Payer: Medicare HMO | Admitting: Nurse Practitioner

## 2021-04-05 DIAGNOSIS — H1033 Unspecified acute conjunctivitis, bilateral: Secondary | ICD-10-CM | POA: Diagnosis not present

## 2021-04-05 MED ORDER — TOBRAMYCIN-DEXAMETHASONE 0.3-0.1 % OP SUSP: 2.0000 [drp] | OPHTHALMIC | 0 refills | Status: DC

## 2021-04-05 NOTE — Progress Notes (Signed)
   Virtual Visit  Note Due to COVID-19 pandemic this visit was conducted virtually. This visit type was conducted due to national recommendations for restrictions regarding the COVID-19 Pandemic (e.g. social distancing, sheltering in place) in an effort to limit this patient's exposure and mitigate transmission in our community. All issues noted in this document were discussed and addressed.  A physical exam was not performed with this format.  I connected with [name] on 04/05/21 at 11:27 by telephone and verified that I am speaking with the correct person using two identifiers. [name] is currently located at home and  No one  is currently with her during visit. The provider, Mary-Margaret Hassell Done, FNP is located in their office at time of visit.  I discussed the limitations, risks, security and privacy concerns of performing an evaluation and management service by telephone and the availability of in person appointments. I also discussed with the patient that there may be a patient responsible charge related to this service. The patient expressed understanding and agreed to proceed.   History and Present Illness:   Chief Complaint: Conjunctivitis   HPI patient calls in c/o bil red eyes with itching and burning. Eye lids swollen and eye lashes were matted together this morning.   Review of Systems  Constitutional: Negative.   HENT: Negative.   Eyes: Positive for pain, discharge and redness. Negative for blurred vision, double vision and photophobia.  All other systems reviewed and are negative.    Observations/Objective: Alert and oriented- answers all questions appropriately No distress bil scleral injection with crust eye  Lashes bil   Assessment and Plan: [name] in today with chief complaint of Conjunctivitis   1. Acute bacterial conjunctivitis of both eyes Meds ordered this encounter  Medications  . tobramycin-dexamethasone (TOBRADEX) ophthalmic solution     Sig: Place 2 drops into both eyes every 4 (four) hours while awake.    Dispense:  10 mL    Refill:  0    Order Specific Question:   Supervising Provider    Answer:   Worthy Rancher [[id number]]   Good handwashing 'try to avoid rubbing eyes Cool compresses    Follow Up Instructions: prn    I discussed the assessment and treatment plan with the patient. The patient was provided an opportunity to ask questions and all were answered. The patient agreed with the plan and demonstrated an understanding of the instructions.   The patient was advised to call back or seek an in-person evaluation if the symptoms worsen or if the condition fails to improve as anticipated.  The above assessment and management plan was discussed with the patient. The patient verbalized understanding of and has agreed to the management plan. Patient is aware to call the clinic if symptoms persist or worsen. Patient is aware when to return to the clinic for a follow-up visit. Patient educated on when it is appropriate to go to the emergency department.   Time call ended:  11:40  I provided 13 minutes of  non face-to-face time during this encounter.    Mary-Margaret Hassell Done, FNP

## 2021-04-24 ENCOUNTER — Ambulatory Visit (INDEPENDENT_AMBULATORY_CARE_PROVIDER_SITE_OTHER): Payer: Medicare HMO | Admitting: Nurse Practitioner

## 2021-04-24 ENCOUNTER — Encounter: Payer: Self-pay | Admitting: Nurse Practitioner

## 2021-04-24 ENCOUNTER — Other Ambulatory Visit: Payer: Self-pay

## 2021-04-24 VITALS — BP 117/72 | HR 100 | Temp 97.8°F | Ht 65.0 in | Wt 186.0 lb

## 2021-04-24 DIAGNOSIS — Z6830 Body mass index (BMI) 30.0-30.9, adult: Secondary | ICD-10-CM | POA: Diagnosis not present

## 2021-04-24 DIAGNOSIS — R69 Illness, unspecified: Secondary | ICD-10-CM | POA: Diagnosis not present

## 2021-04-24 DIAGNOSIS — E8881 Metabolic syndrome: Secondary | ICD-10-CM | POA: Diagnosis not present

## 2021-04-24 DIAGNOSIS — E782 Mixed hyperlipidemia: Secondary | ICD-10-CM

## 2021-04-24 DIAGNOSIS — F411 Generalized anxiety disorder: Secondary | ICD-10-CM | POA: Diagnosis not present

## 2021-04-24 MED ORDER — FENOFIBRATE 145 MG PO TABS: 145.0000 mg | ORAL_TABLET | Freq: Every day | ORAL | 1 refills | Status: DC

## 2021-04-24 MED ORDER — ESCITALOPRAM OXALATE 10 MG PO TABS: 10.0000 mg | ORAL_TABLET | Freq: Every evening | ORAL | 1 refills | Status: DC

## 2021-04-24 MED ORDER — ROSUVASTATIN CALCIUM 10 MG PO TABS: 10.0000 mg | ORAL_TABLET | Freq: Every day | ORAL | 1 refills | Status: DC

## 2021-04-24 MED ORDER — ALPRAZOLAM 0.5 MG PO TABS: 0.5000 mg | ORAL_TABLET | Freq: Two times a day (BID) | ORAL | 2 refills | Status: DC | PRN

## 2021-04-24 MED ORDER — OZEMPIC (0.25 OR 0.5 MG/DOSE) 2 MG/1.5ML ~~LOC~~ SOPN: 0.5000 mg | PEN_INJECTOR | SUBCUTANEOUS | 1 refills | Status: DC

## 2021-04-24 NOTE — Addendum Note (Signed)
Addended by: Chevis Pretty on: 04/24/2021 11:35 AM   Modules accepted: Orders

## 2021-04-24 NOTE — Addendum Note (Signed)
Addended by: Chevis Pretty on: 04/24/2021 11:33 AM   Modules accepted: Orders

## 2021-04-24 NOTE — Progress Notes (Addendum)
Subjective:    Patient ID: [name], female    DOB: 05-Oct-1951, 70 y.o.   MRN: [id number]   Chief Complaint: Medical Management of Chronic Issues    HPI:  1. Mixed hyperlipidemia Does try to watch diet and stays very active. Lab Results  Component Value Date   CHOL 248 (H) 06/01/2020   HDL 50 06/01/2020   LDLCALC 142 (H) 06/01/2020   TRIG 307 (H) 06/01/2020   CHOLHDL 5.0 (H) 06/01/2020     2. GAD (generalized anxiety disorder) Is on lexapro and xanax. The combination is working well for her. Depression screen [facility] 2/9 04/24/2021 05/21/2019 10/01/2018  Decreased Interest 0 0 0  Down, Depressed, Hopeless 0 0 0  PHQ - 2 Score 0 0 0  Altered sleeping 1 - -  Tired, decreased energy 1 - -  Change in appetite 1 - -  Feeling bad or failure about yourself  0 - -  Trouble concentrating 0 - -  Moving slowly or fidgety/restless 0 - -  Suicidal thoughts 0 - -  PHQ-9 Score 3 - -  Difficult doing work/chores Somewhat difficult - -   BMI Readings from Last 3 Encounters:  04/24/21 30.95 kg/m  02/09/21 30.78 kg/m  06/01/20 31.93 kg/m       Outpatient Encounter Medications as of 04/24/2021  Medication Sig  . ALPRAZolam (XANAX) 0.5 MG tablet Take 1 tablet (0.5 mg total) by mouth 2 (two) times daily as needed for anxiety.  Marland Kitchen escitalopram (LEXAPRO) 10 MG tablet Take 1 tablet (10 mg total) by mouth every evening.  . fenofibrate (TRICOR) 145 MG tablet Take 1 tablet (145 mg total) by mouth daily. (Patient not taking: Reported on 04/24/2021)  . rosuvastatin (CRESTOR) 10 MG tablet Take 1 tablet (10 mg total) by mouth daily. (Patient not taking: Reported on 04/24/2021)  . [DISCONTINUED] albuterol (PROVENTIL HFA;VENTOLIN HFA) 108 (90 Base) MCG/ACT inhaler Inhale 2 puffs into the lungs every 4 (four) hours as needed for wheezing or shortness of breath.  . [DISCONTINUED] amoxicillin-clavulanate (AUGMENTIN) 875-125 MG tablet Take 1 tablet by mouth 2 (two) times daily.  . [DISCONTINUED]  benzonatate (TESSALON PERLES) 100 MG capsule Take 1 capsule (100 mg total) by mouth 3 (three) times daily as needed for cough.  . [DISCONTINUED] fluticasone (FLONASE) 50 MCG/ACT nasal spray Place 2 sprays into both nostrils daily.  . [DISCONTINUED] predniSONE (DELTASONE) 20 MG tablet 2 po at sametime daily for 5 days (Patient not taking: Reported on 04/24/2021)  . [DISCONTINUED] tobramycin-dexamethasone (TOBRADEX) ophthalmic solution Place 2 drops into both eyes every 4 (four) hours while awake.  . [DISCONTINUED] trimethoprim-polymyxin b (POLYTRIM) ophthalmic solution Place 2 drops into both eyes every 4 (four) hours.   No facility-administered encounter medications on file as of 04/24/2021.    Past Surgical History:  Procedure Laterality Date  . BACK SURGERY    . CESAREAN SECTION    . KNEE SURGERY     Bilaterally    Family History  Problem Relation Age of Onset  . Heart disease Mother   . Hypertension Mother   . Alcohol abuse Father   . Breast cancer Neg Hx     New complaints: none  Social history: Lives with her husband  Controlled substance contract: n/a    Review of Systems  Constitutional: Negative for diaphoresis.  Eyes: Negative for pain.  Respiratory: Negative for shortness of breath.   Cardiovascular: Negative for chest pain, palpitations and leg swelling.  Gastrointestinal: Negative for abdominal pain.  Endocrine: Negative for polydipsia.  Skin: Negative for rash.  Neurological: Negative for dizziness, weakness and headaches.  Hematological: Does not bruise/bleed easily.  All other systems reviewed and are negative.      Objective:   Physical Exam Vitals and nursing note reviewed.  Constitutional:      General: She is not in acute distress.    Appearance: Normal appearance. She is well-developed.  HENT:     Head: Normocephalic.     Nose: Nose normal.  Eyes:     Pupils: Pupils are equal, round, and reactive to light.  Neck:     Vascular: No carotid  bruit or JVD.  Cardiovascular:     Rate and Rhythm: Normal rate and regular rhythm.     Heart sounds: Normal heart sounds.  Pulmonary:     Effort: Pulmonary effort is normal. No respiratory distress.     Breath sounds: Normal breath sounds. No wheezing or rales.  Chest:     Chest wall: No tenderness.  Abdominal:     General: Bowel sounds are normal. There is no distension or abdominal bruit.     Palpations: Abdomen is soft. There is no hepatomegaly, splenomegaly, mass or pulsatile mass.     Tenderness: There is no abdominal tenderness.  Musculoskeletal:        General: Normal range of motion.     Cervical back: Normal range of motion and neck supple.  Lymphadenopathy:     Cervical: No cervical adenopathy.  Skin:    General: Skin is warm and dry.  Neurological:     Mental Status: She is alert and oriented to person, place, and time.     Deep Tendon Reflexes: Reflexes are normal and symmetric.  Psychiatric:        Behavior: Behavior normal.        Thought Content: Thought content normal.        Judgment: Judgment normal.     BP 117/72   Pulse 100   Temp 97.8 F (36.6 C) (Temporal)   Ht $R'5\' 5"'kv$  (1.651 m)   Wt 186 lb (84.4 kg)   BMI 30.95 kg/m        Assessment & Plan:  ROSLIND MICHAUX comes in today with chief complaint of Medical Management of Chronic Issues   Diagnosis and orders addressed:  1. Mixed hyperlipidemia Low fat diet - rosuvastatin (CRESTOR) 10 MG tablet; Take 1 tablet (10 mg total) by mouth daily.  Dispense: 90 tablet; Refill: 1 - fenofibrate (TRICOR) 145 MG tablet; Take 1 tablet (145 mg total) by mouth daily.  Dispense: 90 tablet; Refill: 1  2. GAD (generalized anxiety disorder) Stress management - escitalopram (LEXAPRO) 10 MG tablet; Take 1 tablet (10 mg total) by mouth every evening.  Dispense: 90 tablet; Refill: 1 - ALPRAZolam (XANAX) 0.5 MG tablet; Take 1 tablet (0.5 mg total) by mouth 2 (two) times daily as needed for anxiety.  Dispense: 60 tablet;  Refill: 2  3. Metabolic syndrome ozempic as indicted- may need prior auth  4. bmi 30.0-30.9 Discussed diet and exercise for person with BMI >25 Will recheck weight in 3-6 months  Orders Placed This Encounter  Procedures  . CBC with Differential/Platelet  . CMP14+EGFR  . Lipid panel    Labs pending Health Maintenance reviewed Diet and exercise encouraged  Follow up plan: 6 months   Mary-Margaret Hassell Done, FNP

## 2021-04-24 NOTE — Patient Instructions (Signed)

## 2021-04-25 LAB — CBC WITH DIFFERENTIAL/PLATELET
Basophils Absolute: 0.1 10*3/uL (ref 0.0–0.2)
Basos: 1 %
EOS (ABSOLUTE): 0.3 10*3/uL (ref 0.0–0.4)
Eos: 5 %
Hematocrit: 42.6 % (ref 34.0–46.6)
Hemoglobin: 14.1 g/dL (ref 11.1–15.9)
Immature Grans (Abs): 0 10*3/uL (ref 0.0–0.1)
Immature Granulocytes: 0 %
Lymphocytes Absolute: 2.8 10*3/uL (ref 0.7–3.1)
Lymphs: 43 %
MCH: 29.1 pg (ref 26.6–33.0)
MCHC: 33.1 g/dL (ref 31.5–35.7)
MCV: 88 fL (ref 79–97)
Monocytes Absolute: 0.4 10*3/uL (ref 0.1–0.9)
Monocytes: 6 %
Neutrophils Absolute: 2.8 10*3/uL (ref 1.4–7.0)
Neutrophils: 45 %
Platelets: 219 10*3/uL (ref 150–450)
RBC: 4.84 x10E6/uL (ref 3.77–5.28)
RDW: 13.1 % (ref 11.7–15.4)
WBC: 6.4 10*3/uL (ref 3.4–10.8)

## 2021-04-25 LAB — CMP14+EGFR
ALT: 27 IU/L (ref 0–32)
AST: 35 IU/L (ref 0–40)
Albumin/Globulin Ratio: 1.8 (ref 1.2–2.2)
Albumin: 4.5 g/dL (ref 3.8–4.8)
Alkaline Phosphatase: 150 IU/L — ABNORMAL HIGH (ref 44–121)
BUN/Creatinine Ratio: 13 (ref 12–28)
BUN: 10 mg/dL (ref 8–27)
Bilirubin Total: 0.5 mg/dL (ref 0.0–1.2)
CO2: 23 mmol/L (ref 20–29)
Calcium: 9.8 mg/dL (ref 8.7–10.3)
Chloride: 104 mmol/L (ref 96–106)
Creatinine, Ser: 0.78 mg/dL (ref 0.57–1.00)
Globulin, Total: 2.5 g/dL (ref 1.5–4.5)
Glucose: 96 mg/dL (ref 65–99)
Potassium: 4.4 mmol/L (ref 3.5–5.2)
Sodium: 144 mmol/L (ref 134–144)
Total Protein: 7 g/dL (ref 6.0–8.5)
eGFR: 83 mL/min/{1.73_m2} (ref >59)

## 2021-04-25 LAB — LIPID PANEL
Chol/HDL Ratio: 6.8 ratio — ABNORMAL HIGH (ref 0.0–4.4)
Cholesterol, Total: 318 mg/dL — ABNORMAL HIGH (ref 100–199)
HDL: 47 mg/dL (ref >39)
LDL Chol Calc (NIH): 231 mg/dL — ABNORMAL HIGH (ref 0–99)
Triglycerides: 204 mg/dL — ABNORMAL HIGH (ref 0–149)
VLDL Cholesterol Cal: 40 mg/dL (ref 5–40)

## 2021-04-27 MED ORDER — ROSUVASTATIN CALCIUM 20 MG PO TABS: 20.0000 mg | ORAL_TABLET | Freq: Every day | ORAL | 1 refills | Status: DC

## 2021-04-27 NOTE — Addendum Note (Signed)
Addended by: Chevis Pretty on: 04/27/2021 07:26 AM   Modules accepted: Orders

## 2021-05-17 DIAGNOSIS — H52223 Regular astigmatism, bilateral: Secondary | ICD-10-CM | POA: Diagnosis not present

## 2021-05-17 DIAGNOSIS — H524 Presbyopia: Secondary | ICD-10-CM | POA: Diagnosis not present

## 2021-07-12 ENCOUNTER — Encounter: Payer: Self-pay | Admitting: Nurse Practitioner

## 2021-07-12 ENCOUNTER — Ambulatory Visit (INDEPENDENT_AMBULATORY_CARE_PROVIDER_SITE_OTHER): Payer: Medicare HMO | Admitting: Nurse Practitioner

## 2021-07-12 DIAGNOSIS — J0101 Acute recurrent maxillary sinusitis: Secondary | ICD-10-CM | POA: Diagnosis not present

## 2021-07-12 MED ORDER — AZITHROMYCIN 250 MG PO TABS: ORAL_TABLET | ORAL | 0 refills | Status: DC

## 2021-07-12 NOTE — Progress Notes (Signed)
Virtual Visit  Note Due to COVID-19 pandemic this visit was conducted virtually. This visit type was conducted due to national recommendations for restrictions regarding the COVID-19 Pandemic (e.g. social distancing, sheltering in place) in an effort to limit this patient's exposure and mitigate transmission in our community. All issues noted in this document were discussed and addressed.  A physical exam was not performed with this format.  I connected with [name] on 07/12/21 at 12:27 by telephone and verified that I am speaking with the correct person using two identifiers. [name] is currently located at home and no one is currently with her during visit. The provider, Mary-Margaret Hassell Done, FNP is located in their office at time of visit.  I discussed the limitations, risks, security and privacy concerns of performing an evaluation and management service by telephone and the availability of in person appointments. I also discussed with the patient that there may be a patient responsible charge related to this service. The patient expressed understanding and agreed to proceed.   History and Present Illness:  Chief Complaint: URI   HPI Patient states that Monday night she develop nausea and vomiting. Then on Tuesday she had some occular migraines that are not unusual for her. Last night she developed and sore throat and congestion.     Review of Systems  Constitutional:  Positive for chills. Negative for fever.  HENT:  Positive for congestion and sore throat.   Respiratory:  Negative for cough and sputum production.   Neurological:  Positive for headaches.    Observations/Objective: Alert and oriented- answers all questions appropriately No distress Voice hoarse Dry cough  Assessment and Plan: [name] in today with chief complaint of URI   1. Acute recurrent maxillary sinusitis 1. Take meds as prescribed 2. Use a cool mist humidifier especially during the  winter months and when heat has been humid. 3. Use saline nose sprays frequently 4. Saline irrigations of the nose can be very helpful if done frequently.  * 4X daily for 1 week*  * Use of a nettie pot can be helpful with this. Follow directions with this* 5. Drink plenty of fluids 6. Keep thermostat turn down low 7.For any cough or congestion  Use plain Mucinex- regular strength or max strength is fine   * Children- consult with Pharmacist for dosing 8. For fever or aces or pains- take tylenol or ibuprofen appropriate for age and weight.  * for fevers greater than 101 orally you may alternate ibuprofen and tylenol every  3 hours.    - azithromycin (ZITHROMAX Z-PAK) 250 MG tablet; As directed  Dispense: 6 tablet; Refill: 0    Follow Up Instructions: prn    I discussed the assessment and treatment plan with the patient. The patient was provided an opportunity to ask questions and all were answered. The patient agreed with the plan and demonstrated an understanding of the instructions.   The patient was advised to call back or seek an in-person evaluation if the symptoms worsen or if the condition fails to improve as anticipated.  The above assessment and management plan was discussed with the patient. The patient verbalized understanding of and has agreed to the management plan. Patient is aware to call the clinic if symptoms persist or worsen. Patient is aware when to return to the clinic for a follow-up visit. Patient educated on when it is appropriate to go to the emergency department.   Time call ended:  1:00  I provided 13 minutes of  non face-to-face time during this encounter.    Mary-Margaret Hassell Done, FNP

## 2021-08-10 ENCOUNTER — Ambulatory Visit (INDEPENDENT_AMBULATORY_CARE_PROVIDER_SITE_OTHER): Payer: Medicare HMO | Admitting: Family

## 2021-08-10 ENCOUNTER — Ambulatory Visit: Payer: Medicare HMO | Admitting: Family Medicine

## 2021-08-10 ENCOUNTER — Encounter: Payer: Self-pay | Admitting: Family

## 2021-08-10 DIAGNOSIS — U071 COVID-19: Secondary | ICD-10-CM

## 2021-08-10 MED ORDER — MOLNUPIRAVIR EUA 200MG CAPSULE: 4.0000 | ORAL_CAPSULE | Freq: Two times a day (BID) | ORAL | 0 refills | Status: AC

## 2021-08-10 MED ORDER — ONDANSETRON HCL 4 MG PO TABS: 4.0000 mg | ORAL_TABLET | Freq: Three times a day (TID) | ORAL | 0 refills | Status: DC | PRN

## 2021-08-10 NOTE — Progress Notes (Signed)
Virtual Visit  Note Due to COVID-19 pandemic this visit was conducted virtually. This visit type was conducted due to national recommendations for restrictions regarding the COVID-19 Pandemic (e.g. social distancing, sheltering in place) in an effort to limit this patient's exposure and mitigate transmission in our community. All issues noted in this document were discussed and addressed.  A physical exam was not performed with this format.  I connected with [name] on 08/10/21 at 9:12 AM  by telephone and verified that I am speaking with the correct person using two identifiers. [name] is currently located at home and her daughter  is currently with her during visit. The provider, Evelina Dun, FNP is located in their office at time of visit.  I discussed the limitations, risks, security and privacy concerns of performing an evaluation and management service by telephone and the availability of in person appointments. I also discussed with the patient that there may be a patient responsible charge related to this service. The patient expressed understanding and agreed to proceed.   History and Present Illness:  Pt and daughter call today with positive COVID. She started throwing up and had cough and congestion. She took a home COVID test this morning and was positive.  Cough This is a new problem. The current episode started yesterday. The problem has been rapidly worsening. The problem occurs every few minutes. The cough is Productive of sputum. Associated symptoms include chills, a fever (101), headaches, myalgias, nasal congestion, postnasal drip, rhinorrhea and a sore throat. Pertinent negatives include no ear congestion, ear pain, shortness of breath or wheezing. The symptoms are aggravated by lying down. She has tried rest and OTC cough suppressant for the symptoms. The treatment provided mild relief.     Review of Systems  Constitutional:  Positive for chills and fever (101).   HENT:  Positive for postnasal drip, rhinorrhea and sore throat. Negative for ear pain.   Respiratory:  Positive for cough. Negative for shortness of breath and wheezing.   Musculoskeletal:  Positive for myalgias.  Neurological:  Positive for headaches.    Observations/Objective: No SOB or distress noted, intermittent coarse cough.   Assessment and Plan: 1. COVID-19 virus detected COVID positive, rest, force fluids, tylenol as needed, Quarantine for at least 5 days and fever free and then wear mask in public for 5 days, report any worsening symptoms such as increased shortness of breath, swelling, or continued high fevers.  Zofran as needed for nausea Possible adverse effects discussed  - ondansetron (ZOFRAN) 4 MG tablet; Take 1 tablet (4 mg total) by mouth every 8 (eight) hours as needed for nausea or vomiting.  Dispense: 20 tablet; Refill: 0 - molnupiravir EUA (LAGEVRIO) 200 mg CAPS capsule; Take 4 capsules (800 mg total) by mouth 2 (two) times daily for 5 days.  Dispense: 40 capsule; Refill: 0    I discussed the assessment and treatment plan with the patient. The patient was provided an opportunity to ask questions and all were answered. The patient agreed with the plan and demonstrated an understanding of the instructions.   The patient was advised to call back or seek an in-person evaluation if the symptoms worsen or if the condition fails to improve as anticipated.  The above assessment and management plan was discussed with the patient. The patient verbalized understanding of and has agreed to the management plan. Patient is aware to call the clinic if symptoms persist or worsen. Patient is aware when to return to  the clinic for a follow-up visit. Patient educated on when it is appropriate to go to the emergency department.   Time call ended: 9:23 AM    I provided 11 minutes of  non face-to-face time during this encounter.    Evelina Dun, FNP

## 2021-08-10 NOTE — Patient Instructions (Signed)
10 Things You Can Do to Manage Your COVID-19 Symptoms at Home If you have possible or confirmed COVID-19 Stay home except to get medical care. Monitor your symptoms carefully. If your symptoms get worse, call your healthcare provider immediately. Get rest and stay hydrated. If you have a medical appointment, call the healthcare provider ahead of time and tell them that you have or may have COVID-19. For medical emergencies, call 911 and notify the dispatch personnel that you have or may have COVID-19. Cover your cough and sneezes with a tissue or use the inside of your elbow. Wash your hands often with soap and water for at least 20 seconds or clean your hands with an alcohol-based hand sanitizer that contains at least 60% alcohol. As much as possible, stay in a specific room and away from other people in your home. Also, you should use a separate bathroom, if available. If you need to be around other people in or outside of the home, wear a mask. Avoid sharing personal items with other people in your household, like dishes, towels, and bedding. Clean all surfaces that are touched often, like counters, tabletops, and doorknobs. Use household cleaning sprays or wipes according to the label instructions. cdc.gov/coronavirus 06/09/2020 This information is not intended to replace advice given to you by your health care provider. Make sure you discuss any questions you have with your health care provider. Document Revised: 03/29/2021 Document Reviewed: 03/29/2021 Elsevier Patient Education  2022 Elsevier Inc.  

## 2021-08-14 ENCOUNTER — Telehealth: Payer: Self-pay | Admitting: Nurse Practitioner

## 2021-08-14 MED ORDER — PROMETHAZINE-DM 6.25-15 MG/5ML PO SYRP: 5.0000 mL | ORAL_SOLUTION | Freq: Three times a day (TID) | ORAL | 0 refills | Status: DC | PRN

## 2021-08-14 MED ORDER — PREDNISONE 10 MG (21) PO TBPK: ORAL_TABLET | ORAL | 0 refills | Status: DC

## 2021-08-14 MED ORDER — BENZONATATE 200 MG PO CAPS: 200.0000 mg | ORAL_CAPSULE | Freq: Three times a day (TID) | ORAL | 1 refills | Status: DC | PRN

## 2021-08-14 NOTE — Telephone Encounter (Signed)
Prescription sent to pharmacy.

## 2021-08-14 NOTE — Telephone Encounter (Signed)
Patient is still complaining with cough and congestion from COVID can you please call something in.

## 2021-09-06 DIAGNOSIS — Z043 Encounter for examination and observation following other accident: Secondary | ICD-10-CM | POA: Diagnosis not present

## 2021-09-28 ENCOUNTER — Other Ambulatory Visit: Payer: Self-pay | Admitting: Nurse Practitioner

## 2021-09-28 DIAGNOSIS — Z1231 Encounter for screening mammogram for malignant neoplasm of breast: Secondary | ICD-10-CM

## 2021-10-01 ENCOUNTER — Ambulatory Visit (INDEPENDENT_AMBULATORY_CARE_PROVIDER_SITE_OTHER): Payer: Medicare HMO

## 2021-10-01 VITALS — Ht 65.0 in | Wt 178.0 lb

## 2021-10-01 DIAGNOSIS — Z0000 Encounter for general adult medical examination without abnormal findings: Secondary | ICD-10-CM

## 2021-10-01 DIAGNOSIS — Z0001 Encounter for general adult medical examination with abnormal findings: Secondary | ICD-10-CM

## 2021-10-01 DIAGNOSIS — Z780 Asymptomatic menopausal state: Secondary | ICD-10-CM

## 2021-10-01 DIAGNOSIS — M8580 Other specified disorders of bone density and structure, unspecified site: Secondary | ICD-10-CM

## 2021-10-01 NOTE — Progress Notes (Signed)
Subjective:   [name] is a 70 y.o. female who presents for an Initial Medicare Annual Wellness Visit. Virtual Visit via Telephone Note  I connected with  [name] on 10/01/21 at 11:15 AM EST by telephone and verified that I am speaking with the correct person using two identifiers.  Location: Patient: Home Provider: [facility] Persons participating in the virtual visit: patient/Nurse Health Advisor   I discussed the limitations, risks, security and privacy concerns of performing an evaluation and management service by telephone and the availability of in person appointments. The patient expressed understanding and agreed to proceed.  Interactive audio and video telecommunications were attempted between this nurse and patient, however failed, due to patient having technical difficulties OR patient did not have access to video capability.  We continued and completed visit with audio only.  Some vital signs may be absent or patient reported.   Chriss Driver, LPN  Review of Systems     Cardiac Risk Factors include: advanced age (>83men, >49 women);dyslipidemia;diabetes mellitus;sedentary lifestyle     Objective:    Today's Vitals   10/01/21 1118  Weight: 178 lb (80.7 kg)  Height: 5\' 5"  (1.651 m)   Body mass index is 29.62 kg/m.  Advanced Directives 10/01/2021 02/09/2021 05/27/2020 07/11/2015  Does Patient Have a Medical Advance Directive? Yes Yes Yes No  Type of Paramedic of [place];Living will Healthcare Power of [place];Living will -  Copy of [place] in Chart? No - copy requested No - copy requested - -  Would patient like information on creating a medical advance directive? - - - No - patient declined information    Current Medications (verified) Outpatient Encounter Medications as of 10/01/2021  Medication Sig   ALPRAZolam (XANAX) 0.5 MG tablet Take 1 tablet (0.5 mg total) by mouth 2 (two)  times daily as needed for anxiety.   azithromycin (ZITHROMAX Z-PAK) 250 MG tablet As directed   benzonatate (TESSALON) 200 MG capsule Take 1 capsule (200 mg total) by mouth 3 (three) times daily as needed.   escitalopram (LEXAPRO) 10 MG tablet Take 1 tablet (10 mg total) by mouth every evening.   fenofibrate (TRICOR) 145 MG tablet Take 1 tablet (145 mg total) by mouth daily.   ondansetron (ZOFRAN) 4 MG tablet Take 1 tablet (4 mg total) by mouth every 8 (eight) hours as needed for nausea or vomiting.   prednisoLONE acetate (PRED FORTE) 1 % ophthalmic suspension SMARTSIG:In Eye(s)   predniSONE (STERAPRED UNI-PAK 21 TAB) 10 MG (21) TBPK tablet Use as directed   promethazine-dextromethorphan (PROMETHAZINE-DM) 6.25-15 MG/5ML syrup Take 5 mLs by mouth 3 (three) times daily as needed for cough.   rosuvastatin (CRESTOR) 20 MG tablet Take 1 tablet (20 mg total) by mouth daily.   Semaglutide,0.25 or 0.5MG /DOS, (OZEMPIC, 0.25 OR 0.5 MG/DOSE,) 2 MG/1.5ML SOPN Inject 0.5 mg into the skin once a week.   No facility-administered encounter medications on file as of 10/01/2021.    Allergies (verified) Bee venom   History: History reviewed. No pertinent past medical history. Past Surgical History:  Procedure Laterality Date   BACK SURGERY     CESAREAN SECTION     KNEE SURGERY     Bilaterally   Family History  Problem Relation Age of Onset   Heart disease Mother    Hypertension Mother    Alcohol abuse Father    Breast cancer Neg Hx    Social History  Socioeconomic History   Marital status: Married    Spouse name: Zenia Resides   Number of children: 2   Years of education: Not on file   Highest education level: Not on file  Occupational History   Not on file  Tobacco Use   Smoking status: Never   Smokeless tobacco: Never  Vaping Use   Vaping Use: Never used  Substance and Sexual Activity   Alcohol use: No   Drug use: Never   Sexual activity: Not on file  Other Topics Concern   Not on file   Social History Narrative   5 grandchildren   Social Determinants of Health   Financial Resource Strain: Low Risk    Difficulty of Paying Living Expenses: Not hard at all  Food Insecurity: No Food Insecurity   Worried About Charity fundraiser in the Last Year: Never true   [place] in the Last Year: Never true  Transportation Needs: No Transportation Needs   Lack of Transportation (Medical): No   Lack of Transportation (Non-Medical): No  Physical Activity: Sufficiently Active   Days of Exercise per Week: 5 days   Minutes of Exercise per Session: 30 min  Stress: No Stress Concern Present   Feeling of Stress : Not at all  Social Connections: Socially Integrated   Frequency of Communication with Friends and Family: More than three times a week   Frequency of Social Gatherings with Friends and Family: More than three times a week   Attends Religious Services: More than 4 times per year   Active Member of [employer] or Organizations: Yes   Attends Music therapist: More than 4 times per year   Marital Status: Married    Tobacco Counseling Counseling given: Not Answered   Clinical Intake:  Pre-visit preparation completed: Yes  Pain : No/denies pain     BMI - recorded: 29.62 Nutritional Status: BMI 25 -29 Overweight Nutritional Risks: None Diabetes: No  How often do you need to have someone help you when you read instructions, pamphlets, or other written materials from your doctor or pharmacy?: 1 - Never  Diabetic? Yes, but patient states she is pre-diabetic. States she does not check blood sugars but does take Ozempic.   Interpreter Needed?: No  Information entered by :: MJ Migdalia Olejniczak, LPN   Activities of Daily Living In your present state of health, do you have any difficulty performing the following activities: 10/01/2021  Hearing? N  Vision? N  Difficulty concentrating or making decisions? N  Walking or climbing stairs? N  Dressing or bathing? N   Doing errands, shopping? N  Preparing Food and eating ? N  Using the Toilet? N  In the past six months, have you accidently leaked urine? N  Do you have problems with loss of bowel control? N  Managing your Medications? N  Managing your Finances? N  Some recent data might be hidden    Patient Care Team: Chevis Pretty, FNP as PCP - General (Family Medicine)  Indicate any recent Medical Services you may have received from other than [facility] providers in the past year (date may be approximate).     Assessment:   This is a routine wellness examination for [name].  Hearing/Vision screen Hearing Screening - Comments:: No hearing issues.  Vision Screening - Comments:: Glasses. Dr. Truman Hayward. 2022.  Dietary issues and exercise activities discussed: Current Exercise Habits: Home exercise routine, Type of exercise: walking, Time (Minutes): 25, Frequency (Times/Week): 5, Weekly Exercise (Minutes/Week): 125,  Intensity: Mild, Exercise limited by: cardiac condition(s)   Goals Addressed   None    Depression Screen PHQ 2/9 Scores 10/01/2021 04/24/2021 05/21/2019 10/01/2018 06/04/2018 03/12/2018 01/22/2018  PHQ - 2 Score 0 0 0 0 0 0 1  PHQ- 9 Score - 3 - - - - -    Fall Risk Fall Risk  10/01/2021 04/24/2021 06/01/2020 05/21/2019 10/01/2018  Falls in the past year? 0 0 1 0 1  Number falls in past yr: 0 - 0 - 0  Injury with Fall? 0 - 0 - 0  Risk for fall due to : Impaired vision - History of fall(s) - -  Follow up Falls prevention discussed - Falls evaluation completed - -    FALL RISK PREVENTION PERTAINING TO THE HOME:  Any stairs in or around the home? No  If so, are there any without handrails? No  Home free of loose throw rugs in walkways, pet beds, electrical cords, etc? Yes  Adequate lighting in your home to reduce risk of falls? Yes   ASSISTIVE DEVICES UTILIZED TO PREVENT FALLS:  Life alert? No  Use of a cane, walker or w/c? No  Grab bars in the bathroom? Yes  Shower chair or bench in  shower? No  Elevated toilet seat or a handicapped toilet? Yes   TIMED UP AND GO:  Was the test performed? No . Phone visit.   Cognitive Function:     6CIT Screen 10/01/2021  What Year? 0 points  What month? 0 points  What time? 0 points  Count back from 20 0 points  Months in reverse 2 points  Repeat phrase 0 points  Total Score 2    Immunizations Immunization History  Administered Date(s) Administered   Influenza, Quadrivalent, Recombinant, Inj, Pf 08/30/2019   Influenza-Unspecified 10/12/1997, 10/23/2000, 12/19/2003, 11/12/2004, 10/22/2005, 09/22/2006, 09/25/2007, 08/11/2013   Moderna SARS-COV2 Booster Vaccination 09/19/2020   Moderna Sars-Covid-2 Vaccination 01/06/2020, 02/04/2020   Pneumococcal Conjugate-13 05/21/2019   Pneumococcal Polysaccharide-23 10/12/1997   Td 09/02/1996, 08/11/2013   Tdap 08/11/2013, 02/09/2021    TDAP status: Up to date  Flu Vaccine status: Due, Education has been provided regarding the importance of this vaccine. Advised may receive this vaccine at local pharmacy or Health Dept. Aware to provide a copy of the vaccination record if obtained from local pharmacy or Health Dept. Verbalized acceptance and understanding.  Pneumococcal vaccine status: Up to date  Covid-19 vaccine status: Information provided on how to obtain vaccines.   Qualifies for Shingles Vaccine? Yes   Zostavax completed No   Shingrix Completed?: No.    Education has been provided regarding the importance of this vaccine. Patient has been advised to call insurance company to determine out of pocket expense if they have not yet received this vaccine. Advised may also receive vaccine at local pharmacy or Health Dept. Verbalized acceptance and understanding.  Screening Tests Health Maintenance  Topic Date Due   Hepatitis C Screening  Never done   Zoster Vaccines- Shingrix (1 of 2) Never done   Pneumonia Vaccine 40+ Years old (3) 05/20/2020   COVID-19 Vaccine (3 - Booster for  Moderna series) 11/14/2020   INFLUENZA VACCINE  06/25/2021   MAMMOGRAM  09/14/2022   Fecal DNA (Cologuard)  06/15/2023   TETANUS/TDAP  02/10/2031   DEXA SCAN  Completed   HPV VACCINES  Aged Out    Health Maintenance  Health Maintenance Due  Topic Date Due   Hepatitis C Screening  Never done   Zoster Vaccines- Shingrix (  1 of 2) Never done   Pneumonia Vaccine 65+ Years old (69) 05/20/2020   COVID-19 Vaccine (3 - Booster for Moderna series) 11/14/2020   INFLUENZA VACCINE  06/25/2021    Colorectal cancer screening: Type of screening: Cologuard. Completed 06/14/2020. Repeat every 3 years  Mammogram status: Ordered Scheduled for 11/06/21.Marland Kitchen Pt provided with contact info and advised to call to schedule appt.   Bone Density status: Ordered 10/01/2021. Pt provided with contact info and advised to call to schedule appt.  Lung Cancer Screening: (Low Dose CT Chest recommended if Age 29-80 years, 30 pack-year currently smoking OR have quit w/in 15years.) does not qualify.    Additional Screening:  Hepatitis C Screening: does qualify; Completed Not done  Vision Screening: Recommended annual ophthalmology exams for early detection of glaucoma and other disorders of the eye. Is the patient up to date with their annual eye exam?  Yes  Who is the provider or what is the name of the office in which the patient attends annual eye exams? Dr. Krista Blue at [facility] If pt is not established with a provider, would they like to be referred to a provider to establish care? No .   Dental Screening: Recommended annual dental exams for proper oral hygiene  Community Resource Referral / Chronic Care Management: CRR required this visit?  No   CCM required this visit?  No      Plan:     I have personally reviewed and noted the following in the patient's chart:   Medical and social history Use of alcohol, tobacco or illicit drugs  Current medications and supplements including opioid prescriptions. Patient  is not currently taking opioid prescriptions. Functional ability and status Nutritional status Physical activity Advanced directives List of other physicians Hospitalizations, surgeries, and ER visits in previous 12 months Vitals Screenings to include cognitive, depression, and falls Referrals and appointments  In addition, I have reviewed and discussed with patient certain preventive protocols, quality metrics, and best practice recommendations. A written personalized care plan for preventive services as well as general preventive health recommendations were provided to patient.     Chriss Driver, LPN   26/07/4853   Nurse Notes: Due for flu and shingles vaccines. Dexa ordered. Up to date on mammogram and cologuard.

## 2021-10-01 NOTE — Patient Instructions (Signed)
[name] , Thank you for taking time to come for your Medicare Wellness Visit. I appreciate your ongoing commitment to your health goals. Please review the following plan we discussed and let me know if I can assist you in the future.   Screening recommendations/referrals: Colonoscopy: Cologuard done 06/14/2020. Due every 3 years.  Mammogram: Scheduled for 11/06/2021 Bone Density: Done 05/21/2019. Order placed today to schedule for 10/24/2021, if possible  Recommended yearly ophthalmology/optometry visit for glaucoma screening and checkup Recommended yearly dental visit for hygiene and checkup  Vaccinations: Influenza vaccine: Due. Repeat annually  Pneumococcal vaccine: Done 10/12/1997 and 05/21/2019 Tdap vaccine: Done 02/09/2021 Repeat in 10 years  Shingles vaccine: Shingrix discussed. Please contact your pharmacy for coverage information.     Covid-19:Done 01/06/2020, 02/04/2020 and 09/19/2020  Advanced directives: Please bring a copy of your health care power of attorney and living will to the office to be added to your chart at your convenience.   Conditions/risks identified: Aim for 30 minutes of exercise or brisk walking each day, drink 6-8 glasses of water and eat lots of fruits and vegetables.   Next appointment: Follow up in one year for your annual wellness visit 2023.   Preventive Care 66 Years and Older, Female Preventive care refers to lifestyle choices and visits with your health care provider that can promote health and wellness. What does preventive care include? A yearly physical exam. This is also called an annual well check. Dental exams once or twice a year. Routine eye exams. Ask your health care provider how often you should have your eyes checked. Personal lifestyle choices, including: Daily care of your teeth and gums. Regular physical activity. Eating a healthy diet. Avoiding tobacco and drug use. Limiting alcohol use. Practicing safe sex. Taking low-dose  aspirin every day. Taking vitamin and mineral supplements as recommended by your health care provider. What happens during an annual well check? The services and screenings done by your health care provider during your annual well check will depend on your age, overall health, lifestyle risk factors, and family history of disease. Counseling  Your health care provider may ask you questions about your: Alcohol use. Tobacco use. Drug use. Emotional well-being. Home and relationship well-being. Sexual activity. Eating habits. History of falls. Memory and ability to understand (cognition). Work and work Statistician. Reproductive health. Screening  You may have the following tests or measurements: Height, weight, and BMI. Blood pressure. Lipid and cholesterol levels. These may be checked every 5 years, or more frequently if you are over 19 years old. Skin check. Lung cancer screening. You may have this screening every year starting at age 45 if you have a 30-pack-year history of smoking and currently smoke or have quit within the past 15 years. Fecal occult blood test (FOBT) of the stool. You may have this test every year starting at age 54. Flexible sigmoidoscopy or colonoscopy. You may have a sigmoidoscopy every 5 years or a colonoscopy every 10 years starting at age 46. Hepatitis C blood test. Hepatitis B blood test. Sexually transmitted disease (STD) testing. Diabetes screening. This is done by checking your blood sugar (glucose) after you have not eaten for a while (fasting). You may have this done every 1-3 years. Bone density scan. This is done to screen for osteoporosis. You may have this done starting at age 16. Mammogram. This may be done every 1-2 years. Talk to your health care provider about how often you should have regular mammograms. Talk with your health care  provider about your test results, treatment options, and if necessary, the need for more tests. Vaccines  Your  health care provider may recommend certain vaccines, such as: Influenza vaccine. This is recommended every year. Tetanus, diphtheria, and acellular pertussis (Tdap, Td) vaccine. You may need a Td booster every 10 years. Zoster vaccine. You may need this after age 30. Pneumococcal 13-valent conjugate (PCV13) vaccine. One dose is recommended after age 76. Pneumococcal polysaccharide (PPSV23) vaccine. One dose is recommended after age 89. Talk to your health care provider about which screenings and vaccines you need and how often you need them. This information is not intended to replace advice given to you by your health care provider. Make sure you discuss any questions you have with your health care provider. Document Released: 12/08/2015 Document Revised: 07/31/2016 Document Reviewed: 09/12/2015 Elsevier Interactive Patient Education  2017 [place] Prevention in the Home Falls can cause injuries. They can happen to people of all ages. There are many things you can do to make your home safe and to help prevent falls. What can I do on the outside of my home? Regularly fix the edges of walkways and driveways and fix any cracks. Remove anything that might make you trip as you walk through a door, such as a raised step or threshold. Trim any bushes or trees on the path to your home. Use bright outdoor lighting. Clear any walking paths of anything that might make someone trip, such as rocks or tools. Regularly check to see if handrails are loose or broken. Make sure that both sides of any steps have handrails. Any raised decks and porches should have guardrails on the edges. Have any leaves, snow, or ice cleared regularly. Use sand or salt on walking paths during winter. Clean up any spills in your garage right away. This includes oil or grease spills. What can I do in the bathroom? Use night lights. Install grab bars by the toilet and in the tub and shower. Do not use towel bars as  grab bars. Use non-skid mats or decals in the tub or shower. If you need to sit down in the shower, use a plastic, non-slip stool. Keep the floor dry. Clean up any water that spills on the floor as soon as it happens. Remove soap buildup in the tub or shower regularly. Attach bath mats securely with double-sided non-slip rug tape. Do not have throw rugs and other things on the floor that can make you trip. What can I do in the bedroom? Use night lights. Make sure that you have a light by your bed that is easy to reach. Do not use any sheets or blankets that are too big for your bed. They should not hang down onto the floor. Have a firm chair that has side arms. You can use this for support while you get dressed. Do not have throw rugs and other things on the floor that can make you trip. What can I do in the kitchen? Clean up any spills right away. Avoid walking on wet floors. Keep items that you use a lot in easy-to-reach places. If you need to reach something above you, use a strong step stool that has a grab bar. Keep electrical cords out of the way. Do not use floor polish or wax that makes floors slippery. If you must use wax, use non-skid floor wax. Do not have throw rugs and other things on the floor that can make you trip. What can I  do with my stairs? Do not leave any items on the stairs. Make sure that there are handrails on both sides of the stairs and use them. Fix handrails that are broken or loose. Make sure that handrails are as long as the stairways. Check any carpeting to make sure that it is firmly attached to the stairs. Fix any carpet that is loose or worn. Avoid having throw rugs at the top or bottom of the stairs. If you do have throw rugs, attach them to the floor with carpet tape. Make sure that you have a light switch at the top of the stairs and the bottom of the stairs. If you do not have them, ask someone to add them for you. What else can I do to help prevent  falls? Wear shoes that: Do not have high heels. Have rubber bottoms. Are comfortable and fit you well. Are closed at the toe. Do not wear sandals. If you use a stepladder: Make sure that it is fully opened. Do not climb a closed stepladder. Make sure that both sides of the stepladder are locked into place. Ask someone to hold it for you, if possible. Clearly mark and make sure that you can see: Any grab bars or handrails. First and last steps. Where the edge of each step is. Use tools that help you move around (mobility aids) if they are needed. These include: Canes. Walkers. Scooters. Crutches. Turn on the lights when you go into a dark area. Replace any light bulbs as soon as they burn out. Set up your furniture so you have a clear path. Avoid moving your furniture around. If any of your floors are uneven, fix them. If there are any pets around you, be aware of where they are. Review your medicines with your doctor. Some medicines can make you feel dizzy. This can increase your chance of falling. Ask your doctor what other things that you can do to help prevent falls. This information is not intended to replace advice given to you by your health care provider. Make sure you discuss any questions you have with your health care provider. Document Released: 09/07/2009 Document Revised: 04/18/2016 Document Reviewed: 12/16/2014 Elsevier Interactive Patient Education  2017 [employer].

## 2021-10-04 DIAGNOSIS — Z818 Family history of other mental and behavioral disorders: Secondary | ICD-10-CM | POA: Diagnosis not present

## 2021-10-04 DIAGNOSIS — Z825 Family history of asthma and other chronic lower respiratory diseases: Secondary | ICD-10-CM | POA: Diagnosis not present

## 2021-10-04 DIAGNOSIS — Z7722 Contact with and (suspected) exposure to environmental tobacco smoke (acute) (chronic): Secondary | ICD-10-CM | POA: Diagnosis not present

## 2021-10-04 DIAGNOSIS — Z8249 Family history of ischemic heart disease and other diseases of the circulatory system: Secondary | ICD-10-CM | POA: Diagnosis not present

## 2021-10-04 DIAGNOSIS — E785 Hyperlipidemia, unspecified: Secondary | ICD-10-CM | POA: Diagnosis not present

## 2021-10-04 DIAGNOSIS — Z811 Family history of alcohol abuse and dependence: Secondary | ICD-10-CM | POA: Diagnosis not present

## 2021-10-04 DIAGNOSIS — F411 Generalized anxiety disorder: Secondary | ICD-10-CM | POA: Diagnosis not present

## 2021-10-04 DIAGNOSIS — R69 Illness, unspecified: Secondary | ICD-10-CM | POA: Diagnosis not present

## 2021-10-04 DIAGNOSIS — F325 Major depressive disorder, single episode, in full remission: Secondary | ICD-10-CM | POA: Diagnosis not present

## 2021-10-17 ENCOUNTER — Other Ambulatory Visit: Payer: Self-pay | Admitting: Nurse Practitioner

## 2021-10-17 DIAGNOSIS — F411 Generalized anxiety disorder: Secondary | ICD-10-CM

## 2021-10-17 DIAGNOSIS — E782 Mixed hyperlipidemia: Secondary | ICD-10-CM

## 2021-10-24 ENCOUNTER — Encounter: Payer: Self-pay | Admitting: Nurse Practitioner

## 2021-10-24 ENCOUNTER — Ambulatory Visit (INDEPENDENT_AMBULATORY_CARE_PROVIDER_SITE_OTHER): Payer: Medicare HMO | Admitting: Nurse Practitioner

## 2021-10-24 VITALS — BP 128/78 | HR 95 | Temp 97.6°F | Resp 20 | Ht 65.0 in | Wt 183.0 lb

## 2021-10-24 DIAGNOSIS — R69 Illness, unspecified: Secondary | ICD-10-CM | POA: Diagnosis not present

## 2021-10-24 DIAGNOSIS — F411 Generalized anxiety disorder: Secondary | ICD-10-CM

## 2021-10-24 DIAGNOSIS — E782 Mixed hyperlipidemia: Secondary | ICD-10-CM

## 2021-10-24 DIAGNOSIS — Z6830 Body mass index (BMI) 30.0-30.9, adult: Secondary | ICD-10-CM | POA: Diagnosis not present

## 2021-10-24 DIAGNOSIS — R739 Hyperglycemia, unspecified: Secondary | ICD-10-CM | POA: Diagnosis not present

## 2021-10-24 DIAGNOSIS — E8881 Metabolic syndrome: Secondary | ICD-10-CM

## 2021-10-24 LAB — BAYER DCA HB A1C WAIVED: HB A1C (BAYER DCA - WAIVED): 5.2 % (ref 4.8–5.6)

## 2021-10-24 MED ORDER — ALPRAZOLAM 0.5 MG PO TABS: 0.5000 mg | ORAL_TABLET | Freq: Two times a day (BID) | ORAL | 2 refills | Status: DC | PRN

## 2021-10-24 MED ORDER — ESCITALOPRAM OXALATE 10 MG PO TABS: 10.0000 mg | ORAL_TABLET | Freq: Every evening | ORAL | 1 refills | Status: DC

## 2021-10-24 NOTE — Progress Notes (Signed)
Subjective:    Patient ID: [name], female    DOB: 1951/07/11, 70 y.o.   MRN: [id number]   Chief Complaint: medical management of chronic issues     HPI:  1. Mixed hyperlipidemia Does try to watch diet, but does no dedicated exercise. She stopped her crestor due to cramps. Lab Results  Component Value Date   CHOL 318 (H) 04/24/2021   HDL 47 04/24/2021   LDLCALC 231 (H) 04/24/2021   TRIG 204 (H) 04/24/2021   CHOLHDL 6.8 (H) 04/24/2021     2. GAD (generalized anxiety disorder) Is on xanax as needed but doe snot take meds very often. GAD 7 : Generalized Anxiety Score 10/24/2021 04/24/2021 05/21/2019  Nervous, Anxious, on Edge 1 1 1   Control/stop worrying 0 0 1  Worry too much - different things 0 0 1  Trouble relaxing 1 1 0  Restless 0 1 0  Easily annoyed or irritable 2 1 1   Afraid - awful might happen 0 0 0  Total GAD 7 Score 4 4 4   Anxiety Difficulty Somewhat difficult Somewhat difficult Not difficult at all      3. Metabolic syndrome Does not check blood sugars at home. Tries to watch diet. No results found for: HGBA1C   4. BMI 30.0-30.9,adult Weight is up 5 lbs Wt Readings from Last 3 Encounters:  10/24/21 183 lb (83 kg)  10/01/21 178 lb (80.7 kg)  04/24/21 186 lb (84.4 kg)   BMI Readings from Last 3 Encounters:  10/24/21 30.45 kg/m  10/01/21 29.62 kg/m  04/24/21 30.95 kg/m      Outpatient Encounter Medications as of 10/24/2021  Medication Sig   ALPRAZolam (XANAX) 0.5 MG tablet Take 1 tablet (0.5 mg total) by mouth 2 (two) times daily as needed for anxiety.   azithromycin (ZITHROMAX Z-PAK) 250 MG tablet As directed   benzonatate (TESSALON) 200 MG capsule Take 1 capsule (200 mg total) by mouth 3 (three) times daily as needed.   escitalopram (LEXAPRO) 10 MG tablet TAKE 1 TABLET BY MOUTH ONCE DAILY IN THE EVENING   fenofibrate (TRICOR) 145 MG tablet Take 1 tablet by mouth once daily   ondansetron (ZOFRAN) 4 MG tablet Take 1 tablet (4 mg total)  by mouth every 8 (eight) hours as needed for nausea or vomiting.   prednisoLONE acetate (PRED FORTE) 1 % ophthalmic suspension SMARTSIG:In Eye(s)   predniSONE (STERAPRED UNI-PAK 21 TAB) 10 MG (21) TBPK tablet Use as directed   promethazine-dextromethorphan (PROMETHAZINE-DM) 6.25-15 MG/5ML syrup Take 5 mLs by mouth 3 (three) times daily as needed for cough.   rosuvastatin (CRESTOR) 20 MG tablet Take 1 tablet by mouth once daily   Semaglutide,0.25 or 0.5MG /DOS, (OZEMPIC, 0.25 OR 0.5 MG/DOSE,) 2 MG/1.5ML SOPN Inject 0.5 mg into the skin once a week.   No facility-administered encounter medications on file as of 10/24/2021.    Past Surgical History:  Procedure Laterality Date   BACK SURGERY     CESAREAN SECTION     KNEE SURGERY     Bilaterally    Family History  Problem Relation Age of Onset   Heart disease Mother    Hypertension Mother    Alcohol abuse Father    Breast cancer Neg Hx     New complaints: None today  Social history: Lives with her husband and helps keep her grandkids  Controlled substance contract: n/a     Review of Systems  Constitutional:  Negative for diaphoresis.  Eyes:  Negative for pain.  Respiratory:  Negative for shortness of breath.   Cardiovascular:  Negative for chest pain, palpitations and leg swelling.  Gastrointestinal:  Negative for abdominal pain.  Endocrine: Negative for polydipsia.  Skin:  Negative for rash.  Neurological:  Negative for dizziness, weakness and headaches.  Hematological:  Does not bruise/bleed easily.  All other systems reviewed and are negative.     Objective:   Physical Exam Vitals and nursing note reviewed.  Constitutional:      General: She is not in acute distress.    Appearance: Normal appearance. She is well-developed.  HENT:     Head: Normocephalic.     Right Ear: Tympanic membrane normal.     Left Ear: Tympanic membrane normal.     Nose: Nose normal.     Mouth/Throat:     Mouth: Mucous membranes are  moist.  Eyes:     Pupils: Pupils are equal, round, and reactive to light.  Neck:     Vascular: No carotid bruit or JVD.  Cardiovascular:     Rate and Rhythm: Normal rate and regular rhythm.     Heart sounds: Normal heart sounds.  Pulmonary:     Effort: Pulmonary effort is normal. No respiratory distress.     Breath sounds: Normal breath sounds. No wheezing or rales.  Chest:     Chest wall: No tenderness.  Abdominal:     General: Bowel sounds are normal. There is no distension or abdominal bruit.     Palpations: Abdomen is soft. There is no hepatomegaly, splenomegaly, mass or pulsatile mass.     Tenderness: There is no abdominal tenderness.  Musculoskeletal:        General: Normal range of motion.     Cervical back: Normal range of motion and neck supple.  Lymphadenopathy:     Cervical: No cervical adenopathy.  Skin:    General: Skin is warm and dry.  Neurological:     Mental Status: She is alert and oriented to person, place, and time.     Deep Tendon Reflexes: Reflexes are normal and symmetric.  Psychiatric:        Behavior: Behavior normal.        Thought Content: Thought content normal.        Judgment: Judgment normal.    BP 128/78   Pulse 95   Temp 97.6 F (36.4 C) (Temporal)   Resp 20   Ht 5\' 5"  (1.651 m)   Wt 183 lb (83 kg)   SpO2 95%   BMI 30.45 kg/m        Assessment & Plan:   [name] comes in today with chief complaint of Medical Management of Chronic Issues   Diagnosis and orders addressed:  1. Mixed hyperlipidemia Low fat diet - CBC with Differential/Platelet - CMP14+EGFR - Lipid panel  2. GAD (generalized anxiety disorder) Stress management - escitalopram (LEXAPRO) 10 MG tablet; Take 1 tablet (10 mg total) by mouth every evening.  Dispense: 90 tablet; Refill: 1 - ALPRAZolam (XANAX) 0.5 MG tablet; Take 1 tablet (0.5 mg total) by mouth 2 (two) times daily as needed for anxiety.  Dispense: 60 tablet; Refill: 2  3. Metabolic  syndrome Watch carbs on diet - Bayer DCA Hb A1c Waived  4. BMI 30.0-30.9,adult Discussed diet and exercise for person with BMI >25 Will recheck weight in 3-6 months    Labs pending Health Maintenance reviewed Diet and exercise encouraged  Follow up plan: 6 months   Mary-Margaret 05-25-1997, FNP

## 2021-10-24 NOTE — Addendum Note (Signed)
Addended by: Rolena Infante on: 10/24/2021 09:31 AM   Modules accepted: Orders

## 2021-10-24 NOTE — Patient Instructions (Signed)
Heart-Healthy Eating Plan Heart-healthy meal planning includes: Eating less unhealthy fats. Eating more healthy fats. Making other changes in your diet. Talk with your doctor or a diet specialist (dietitian) to create an eating plan that is right for you. What is my plan? Your doctor may recommend an eating plan that includes: Total fat: ______% or less of total calories a day. Saturated fat: ______% or less of total calories a day. Cholesterol: less than _________mg a day. What are tips for following this plan? Cooking Avoid frying your food. Try to bake, boil, grill, or broil it instead. You can also reduce fat by: Removing the skin from poultry. Removing all visible fats from meats. Steaming vegetables in water or broth. Meal planning  At meals, divide your plate into four equal parts: Fill one-half of your plate with vegetables and green salads. Fill one-fourth of your plate with whole grains. Fill one-fourth of your plate with lean protein foods. Eat 4-5 servings of vegetables per day. A serving of vegetables is: 1 cup of raw or cooked vegetables. 2 cups of raw leafy greens. Eat 4-5 servings of fruit per day. A serving of fruit is: 1 medium whole fruit.  cup of dried fruit.  cup of fresh, frozen, or canned fruit.  cup of 100% fruit juice. Eat more foods that have soluble fiber. These are apples, broccoli, carrots, beans, peas, and barley. Try to get 20-30 g of fiber per day. Eat 4-5 servings of nuts, legumes, and seeds per week: 1 serving of dried beans or legumes equals  cup after being cooked. 1 serving of nuts is  cup. 1 serving of seeds equals 1 tablespoon. General information Eat more home-cooked food. Eat less restaurant, buffet, and fast food. Limit or avoid alcohol. Limit foods that are high in starch and sugar. Avoid fried foods. Lose weight if you are overweight. Keep track of how much salt (sodium) you eat. This is important if you have high blood  pressure. Ask your doctor to tell you more about this. Try to add vegetarian meals each week. Fats Choose healthy fats. These include olive oil and canola oil, flaxseeds, walnuts, almonds, and seeds. Eat more omega-3 fats. These include salmon, mackerel, sardines, tuna, flaxseed oil, and ground flaxseeds. Try to eat fish at least 2 times each week. Check food labels. Avoid foods with trans fats or high amounts of saturated fat. Limit saturated fats. These are often found in animal products, such as meats, butter, and cream. These are also found in plant foods, such as palm oil, palm kernel oil, and coconut oil. Avoid foods with partially hydrogenated oils in them. These have trans fats. Examples are stick margarine, some tub margarines, cookies, crackers, and other baked goods. What foods can I eat? Fruits All fresh, canned (in natural juice), or frozen fruits. Vegetables Fresh or frozen vegetables (raw, steamed, roasted, or grilled). Green salads. Grains Most grains. Choose whole wheat and whole grains most of the time. Rice and pasta, including brown rice and pastas made with whole wheat. Meats and other proteins Lean, well-trimmed beef, veal, pork, and lamb. Chicken and turkey without skin. All fish and shellfish. Wild duck, rabbit, pheasant, and venison. Egg whites or low-cholesterol egg substitutes. Dried beans, peas, lentils, and tofu. Seeds and most nuts. Dairy Low-fat or nonfat cheeses, including ricotta and mozzarella. Skim or 1% milk that is liquid, powdered, or evaporated. Buttermilk that is made with low-fat milk. Nonfat or low-fat yogurt. Fats and oils Non-hydrogenated (trans-free) margarines. Vegetable oils, including   soybean, sesame, sunflower, olive, peanut, safflower, corn, canola, and cottonseed. Salad dressings or mayonnaise made with a vegetable oil. Beverages Mineral water. Coffee and tea. Diet carbonated beverages. Sweets and desserts Sherbet, gelatin, and fruit ice.  Small amounts of dark chocolate. Limit all sweets and desserts. Seasonings and condiments All seasonings and condiments. The items listed above may not be a complete list of foods and drinks you can eat. Contact a dietitian for more options. What foods should I avoid? Fruits Canned fruit in heavy syrup. Fruit in cream or butter sauce. Fried fruit. Limit coconut. Vegetables Vegetables cooked in cheese, cream, or butter sauce. Fried vegetables. Grains Breads that are made with saturated or trans fats, oils, or whole milk. Croissants. Sweet rolls. Donuts. High-fat crackers, such as cheese crackers. Meats and other proteins Fatty meats, such as hot dogs, ribs, sausage, bacon, rib-eye roast or steak. High-fat deli meats, such as salami and bologna. Caviar. Domestic duck and goose. Organ meats, such as liver. Dairy Cream, sour cream, cream cheese, and creamed cottage cheese. Whole-milk cheeses. Whole or 2% milk that is liquid, evaporated, or condensed. Whole buttermilk. Cream sauce or high-fat cheese sauce. Yogurt that is made from whole milk. Fats and oils Meat fat, or shortening. Cocoa butter, hydrogenated oils, palm oil, coconut oil, palm kernel oil. Solid fats and shortenings, including bacon fat, salt pork, lard, and butter. Nondairy cream substitutes. Salad dressings with cheese or sour cream. Beverages Regular sodas and juice drinks with added sugar. Sweets and desserts Frosting. Pudding. Cookies. Cakes. Pies. Milk chocolate or white chocolate. Buttered syrups. Full-fat ice cream or ice cream drinks. The items listed above may not be a complete list of foods and drinks to avoid. Contact a dietitian for more information. Summary Heart-healthy meal planning includes eating less unhealthy fats, eating more healthy fats, and making other changes in your diet. Eat a balanced diet. This includes fruits and vegetables, low-fat or nonfat dairy, lean protein, nuts and legumes, whole grains, and  heart-healthy oils and fats. This information is not intended to replace advice given to you by your health care provider. Make sure you discuss any questions you have with your health care provider. Document Revised: 03/22/2021 Document Reviewed: 03/22/2021 Elsevier Patient Education  2022 [employer] Inc[employer]  

## 2021-10-25 ENCOUNTER — Other Ambulatory Visit: Payer: Self-pay

## 2021-10-25 DIAGNOSIS — E782 Mixed hyperlipidemia: Secondary | ICD-10-CM

## 2021-10-25 LAB — CMP14+EGFR
ALT: 20 IU/L (ref 0–32)
AST: 36 IU/L (ref 0–40)
Albumin/Globulin Ratio: 1.8 (ref 1.2–2.2)
Albumin: 4.5 g/dL (ref 3.8–4.8)
Alkaline Phosphatase: 132 IU/L — ABNORMAL HIGH (ref 44–121)
BUN/Creatinine Ratio: 13 (ref 12–28)
BUN: 9 mg/dL (ref 8–27)
Bilirubin Total: 0.3 mg/dL (ref 0.0–1.2)
CO2: 20 mmol/L (ref 20–29)
Calcium: 9.5 mg/dL (ref 8.7–10.3)
Chloride: 104 mmol/L (ref 96–106)
Creatinine, Ser: 0.72 mg/dL (ref 0.57–1.00)
Globulin, Total: 2.5 g/dL (ref 1.5–4.5)
Glucose: 107 mg/dL — ABNORMAL HIGH (ref 70–99)
Potassium: 4.2 mmol/L (ref 3.5–5.2)
Sodium: 142 mmol/L (ref 134–144)
Total Protein: 7 g/dL (ref 6.0–8.5)
eGFR: 91 mL/min/{1.73_m2} (ref >59)

## 2021-10-25 LAB — CBC WITH DIFFERENTIAL/PLATELET
Basophils Absolute: 0.1 10*3/uL (ref 0.0–0.2)
Basos: 2 %
EOS (ABSOLUTE): 0.2 10*3/uL (ref 0.0–0.4)
Eos: 4 %
Hematocrit: 40.1 % (ref 34.0–46.6)
Hemoglobin: 13.2 g/dL (ref 11.1–15.9)
Immature Grans (Abs): 0 10*3/uL (ref 0.0–0.1)
Immature Granulocytes: 0 %
Lymphocytes Absolute: 2 10*3/uL (ref 0.7–3.1)
Lymphs: 32 %
MCH: 28.9 pg (ref 26.6–33.0)
MCHC: 32.9 g/dL (ref 31.5–35.7)
MCV: 88 fL (ref 79–97)
Monocytes Absolute: 0.3 10*3/uL (ref 0.1–0.9)
Monocytes: 5 %
Neutrophils Absolute: 3.5 10*3/uL (ref 1.4–7.0)
Neutrophils: 57 %
Platelets: 228 10*3/uL (ref 150–450)
RBC: 4.56 x10E6/uL (ref 3.77–5.28)
RDW: 12.9 % (ref 11.7–15.4)
WBC: 6.2 10*3/uL (ref 3.4–10.8)

## 2021-10-25 LAB — LIPID PANEL
Chol/HDL Ratio: 6.6 ratio — ABNORMAL HIGH (ref 0.0–4.4)
Cholesterol, Total: 318 mg/dL — ABNORMAL HIGH (ref 100–199)
HDL: 48 mg/dL (ref >39)
LDL Chol Calc (NIH): 233 mg/dL — ABNORMAL HIGH (ref 0–99)
Triglycerides: 189 mg/dL — ABNORMAL HIGH (ref 0–149)
VLDL Cholesterol Cal: 37 mg/dL (ref 5–40)

## 2021-10-29 ENCOUNTER — Telehealth: Payer: Self-pay

## 2021-10-29 NOTE — Chronic Care Management (AMB) (Signed)
  Chronic Care Management   Note  10/29/2021 Name: DAYRIN STALLONE MRN: [id number] DOB: [birth year]  CHRISTALYN GOERTZ is a 70 y.o. year old female who is a primary care patient of Chevis Pretty, FNP. I reached out to [employer] by phone today in response to a referral sent by Ms. Christene Slates Usery's PCP.  Ms. Petro was given information about Chronic Care Management services today including:  CCM service includes personalized support from designated clinical staff supervised by her physician, including individualized plan of care and coordination with other care providers 24/7 contact phone numbers for assistance for urgent and routine care needs. Service will only be billed when office clinical staff spend 20 minutes or more in a month to coordinate care. Only one practitioner may furnish and bill the service in a calendar month. The patient may stop CCM services at any time (effective at the end of the month) by phone call to the office staff. The patient is responsible for co-pay (up to 20% after annual deductible is met) if co-pay is required by the individual health plan.   Patient agreed to services and verbal consent obtained.   Follow up plan: Telephone appointment with care management team member scheduled for:11/27/21  Noreene Larsson, [address], [place], [place] [address] Direct Dial: [phone] [email or web address]: [email or web address]

## 2021-11-06 ENCOUNTER — Ambulatory Visit
Admission: RE | Admit: 2021-11-06 | Discharge: 2021-11-06 | Disposition: A | Discharge status: Home / Self Care | Payer: Medicare HMO | Source: Ambulatory Visit | Attending: Nurse Practitioner | Admitting: Nurse Practitioner

## 2021-11-06 ENCOUNTER — Other Ambulatory Visit: Payer: Self-pay

## 2021-11-06 DIAGNOSIS — Z1231 Encounter for screening mammogram for malignant neoplasm of breast: Secondary | ICD-10-CM

## 2021-11-15 ENCOUNTER — Ambulatory Visit (INDEPENDENT_AMBULATORY_CARE_PROVIDER_SITE_OTHER): Payer: Medicare HMO

## 2021-11-15 DIAGNOSIS — Z780 Asymptomatic menopausal state: Secondary | ICD-10-CM

## 2021-11-15 DIAGNOSIS — M8588 Other specified disorders of bone density and structure, other site: Secondary | ICD-10-CM

## 2021-11-15 DIAGNOSIS — M8589 Other specified disorders of bone density and structure, multiple sites: Secondary | ICD-10-CM | POA: Diagnosis not present

## 2021-11-27 ENCOUNTER — Ambulatory Visit (INDEPENDENT_AMBULATORY_CARE_PROVIDER_SITE_OTHER): Payer: Medicare HMO | Admitting: Pharmacist

## 2021-11-27 DIAGNOSIS — E782 Mixed hyperlipidemia: Secondary | ICD-10-CM

## 2021-11-27 DIAGNOSIS — E8881 Metabolic syndrome: Secondary | ICD-10-CM

## 2021-11-27 MED ORDER — REPATHA SURECLICK 140 MG/ML ~~LOC~~ SOAJ: 140.0000 mg | SUBCUTANEOUS | 5 refills | Status: DC

## 2021-11-27 NOTE — Patient Instructions (Signed)
Visit Information  Thank you for taking time to visit with me today. Please don't hesitate to contact me if I can be of assistance to you before our next scheduled telephone appointment.  Following are the goals we discussed today:  Current Barriers:  Unable to independently afford treatment regimen Unable to achieve control of hyperlipidemia  Suboptimal therapeutic regimen for hyperlipidemia  Pharmacist Clinical Goal(s):  patient will verbalize ability to afford treatment regimen achieve control of hyperlipidemia as evidenced by goal ldl<100 through collaboration with PharmD and provider.    Interventions: 1:1 collaboration with Chevis Pretty, FNP regarding development and update of comprehensive plan of care as evidenced by provider attestation and co-signature Inter-disciplinary care team collaboration (see longitudinal plan of care) Comprehensive medication review performed; medication list updated in electronic medical record  Hyperlipidemia:  New goal. Uncontrolled; current treatment: NONE-->WILL WORK TO GET [place]; LDL goal<100 Medications previously tried: ATORVASTATIN, ROSUVASTATIN  Current dietary patterns: recommend heart healthy diet Educated on repatha vs nexlizet Recommended repatha 140gm every 14 days based on compliance and patient preference Assessed patient finances. Enrolled in the [employer] grant for copay assistance Lipid Panel     Component Value Date/Time   CHOL 318 (H) 10/24/2021 0934   TRIG 189 (H) 10/24/2021 0934   HDL 48 10/24/2021 0934   CHOLHDL 6.6 (H) 10/24/2021 0934   LDLCALC 233 (H) 10/24/2021 0934   LABVLDL 37 10/24/2021 0934     Patient Goals/Self-Care Activities patient will:  - take medications as prescribed as evidenced by patient report and record review collaborate with provider on medication access solutions target a minimum of 150 minutes of moderate intensity exercise weekly engage in dietary modifications by  HEART HEALTHY DIET   Please call the care guide team at [phone] if you need to cancel or reschedule your appointment.   The patient verbalized understanding of instructions, educational materials, and care plan provided today and declined offer to receive copy of patient instructions, educational materials, and care plan.   Signature Regina Eck, PharmD, BCPS Clinical Pharmacist, [place]  [facility] Phone [phone]

## 2021-11-27 NOTE — Progress Notes (Signed)
Chronic Care Management Pharmacy Note  11/27/2021 Name:  [name] MRN:  [id number] DOB:  1951/04/20  Summary: [name]  Recommendations/Changes made from today's visit:  Hyperlipidemia:  New goal. Uncontrolled; current treatment: NONE-->WILL WORK TO GET REPATHA APPROVED; LDL goal<100 Medications previously tried: ATORVASTATIN, ROSUVASTATIN  Current dietary patterns: recommend heart healthy diet Educated on repatha vs nexlizet Recommended repatha 140gm every 14 days based on compliance and patient preference Assessed patient finances. Enrolled in the [employer] grant for copay assistance Lipid Panel     Component Value Date/Time   CHOL 318 (H) 10/24/2021 0934   TRIG 189 (H) 10/24/2021 0934   HDL 48 10/24/2021 0934   CHOLHDL 6.6 (H) 10/24/2021 0934   LDLCALC 233 (H) 10/24/2021 0934   LABVLDL 37 10/24/2021 0934     Patient Goals/Self-Care Activities patient will:  - take medications as prescribed as evidenced by patient report and record review collaborate with provider on medication access solutions target a minimum of 150 minutes of moderate intensity exercise weekly engage in dietary modifications by HEART HEALTHY DIET  Subjective: [name] is an 71 y.o. year old female who is a primary patient of Chevis Pretty, [place].  The CCM team was consulted for assistance with disease management and care coordination needs.    Engaged with patient by telephone for initial visit in response to provider referral for pharmacy case management and/or care coordination services.   Consent to Services:  The patient was given information about Chronic Care Management services, agreed to services, and gave verbal consent prior to initiation of services.  Please see initial visit note for detailed documentation.   Patient Care Team: Chevis Pretty, FNP as PCP - General (Family Medicine) Lavera Guise, [facility] as [facility]  (Pharmacist)   Objective:  Lab Results  Component Value Date   CREATININE 0.72 10/24/2021   CREATININE 0.78 04/24/2021   CREATININE 0.74 06/01/2020    Lab Results  Component Value Date   HGBA1C 5.2 10/24/2021   Last diabetic Eye exam: No results found for: HMDIABEYEEXA  Last diabetic Foot exam: No results found for: HMDIABFOOTEX      Component Value Date/Time   CHOL 318 (H) 10/24/2021 0934   TRIG 189 (H) 10/24/2021 0934   HDL 48 10/24/2021 0934   CHOLHDL 6.6 (H) 10/24/2021 0934   LDLCALC 233 (H) 10/24/2021 0934    Hepatic Function Latest Ref Rng & Units 10/24/2021 04/24/2021 06/01/2020  Total Protein 6.0 - 8.5 g/dL 7.0 7.0 6.8  Albumin 3.8 - 4.8 g/dL 4.5 4.5 4.3  AST 0 - 40 IU/L 36 35 22  ALT 0 - 32 IU/L _0 Alk Phosphatase 44 - 121 IU/L 132(H) 150(H) 125(H)  Total Bilirubin 0.0 - 1.2 mg/dL 0.3 0.5 0.3    Lab Results  Component Value Date/Time   TSH 2.680 06/01/2020 03:56 PM   TSH 2.590 05/21/2019 11:41 AM    CBC Latest Ref Rng & Units 10/24/2021 04/24/2021 06/01/2020  WBC 3.4 - 10.8 x10E3/uL 6.2 6.4 9.3  Hemoglobin 11.1 - 15.9 g/dL 13.2 14.1 13.6  Hematocrit 34.0 - 46.6 % 40.1 42.6 40.0  Platelets 150 - 450 x10E3/uL 228 219 260    No results found for: VD25OH  Clinical ASCVD: No  The 10-year ASCVD risk score (Arnett DK, et al., 2019) is: 9.5%   Values used to calculate the score:     Age: 29 years     Sex: Female  Is Non-Hispanic African American: No     Diabetic: No     Tobacco smoker: No     Systolic Blood Pressure: 130 mmHg     Is BP treated: No     HDL Cholesterol: 48 mg/dL     Total Cholesterol: 318 mg/dL    Other: (CHADS2VASc if Afib, PHQ9 if depression, MMRC or CAT for COPD, ACT, DEXA)  Social History   Tobacco Use  Smoking Status Never  Smokeless Tobacco Never   BP Readings from Last 3 Encounters:  10/24/21 128/78  04/24/21 117/72  02/09/21 (!) 184/87   Pulse Readings from Last 3 Encounters:  10/24/21 95  04/24/21 100   02/09/21 (!) 106   Wt Readings from Last 3 Encounters:  10/24/21 183 lb (83 kg)  10/01/21 178 lb (80.7 kg)  04/24/21 186 lb (84.4 kg)    Assessment: Review of patient past medical history, allergies, medications, health status, including review of consultants reports, laboratory and other test data, was performed as part of comprehensive evaluation and provision of chronic care management services.   SDOH:  (Social Determinants of Health) assessments and interventions performed:    CCM Care Plan  Allergies  Allergen Reactions   Bee Venom Swelling   Rosuvastatin Other (See Comments)    myopathy   Lipitor [Atorvastatin] Other (See Comments)    myopathy    Medications Reviewed Today     Reviewed by Lavera Guise, [facility] (Pharmacist) on 11/27/21 at 1041  Med List Status: <None>   Medication Order Taking? Sig Documenting Provider Last Dose Status Informant  ALPRAZolam (XANAX) 0.5 MG tablet [id number]  Take 1 tablet (0.5 mg total) by mouth 2 (two) times daily as needed for anxiety. Hassell Done, Mary-Margaret, FNP  Active   escitalopram (LEXAPRO) 10 MG tablet [id number]  Take 1 tablet (10 mg total) by mouth every evening. Hassell Done, Mary-Margaret, FNP  Active   Evolocumab [facility] SURECLICK) 440 MG/ML Darden Palmer [id number] Yes Inject 140 mg into the skin every 14 (fourteen) days. Chevis Pretty, FNP  Active             Patient Active Problem List   Diagnosis Date Noted   Metabolic syndrome 44/01/4741   BMI 30.0-30.9,adult 04/24/2021   Hyperlipidemia 06/01/2020   GAD (generalized anxiety disorder) 05/21/2019   Well woman exam with routine gynecological exam 02/07/2017    Immunization History  Administered Date(s) Administered   Influenza, Quadrivalent, Recombinant, Inj, Pf 08/30/2019   Influenza-Unspecified 10/12/1997, 10/23/2000, 12/19/2003, 11/12/2004, 10/22/2005, 09/22/2006, 09/25/2007, 08/11/2013   Moderna SARS-COV2 Booster Vaccination 09/19/2020   Moderna Sars-Covid-2  Vaccination 01/06/2020, 02/04/2020   Pneumococcal Conjugate-13 05/21/2019   Pneumococcal Polysaccharide-23 10/12/1997   Td 09/02/1996, 08/11/2013   Tdap 08/11/2013, 02/09/2021    Conditions to be addressed/monitored: HLD  Care Plan : PHARMD MEDICATION MANAGEMENT  Updates made by Lavera Guise, [place] since 11/27/2021 12:00 AM     Problem: DISEASE PROGRESSION PREVENTION      Long-Range Goal: HYPERLIPIDEMIA   This Visit's Progress: Not on track  Priority: High  Note:   Current Barriers:  Unable to independently afford treatment regimen Unable to achieve control of hyperlipidemia  Suboptimal therapeutic regimen for hyperlipidemia  Pharmacist Clinical Goal(s):  patient will verbalize ability to afford treatment regimen achieve control of hyperlipidemia as evidenced by goal ldl<100  through collaboration with PharmD and provider.    Interventions: 1:1 collaboration with Chevis Pretty, FNP regarding development and update of comprehensive plan of care as evidenced by provider attestation and co-signature Inter-disciplinary care  team collaboration (see longitudinal plan of care) Comprehensive medication review performed; medication list updated in electronic medical record  Hyperlipidemia:  New goal. Uncontrolled; current treatment: NONE-->WILL WORK TO GET REPATHA APPROVED; LDL goal<100 Medications previously tried: ATORVASTATIN, ROSUVASTATIN  Current dietary patterns: recommend heart healthy diet Educated on repatha vs nexlizet Recommended repatha 140gm every 14 days based on compliance and patient preference Assessed patient finances. Enrolled in the [employer] grant for copay assistance Lipid Panel     Component Value Date/Time   CHOL 318 (H) 10/24/2021 0934   TRIG 189 (H) 10/24/2021 0934   HDL 48 10/24/2021 0934   CHOLHDL 6.6 (H) 10/24/2021 0934   LDLCALC 233 (H) 10/24/2021 0934   LABVLDL 37 10/24/2021 0934     Patient Goals/Self-Care Activities patient will:   - take medications as prescribed as evidenced by patient report and record review collaborate with provider on medication access solutions target a minimum of 150 minutes of moderate intensity exercise weekly engage in dietary modifications by HEART HEALTHY DIET      Medication Assistance:  [place]  Patient's preferred pharmacy is:  [facility] [address], [place] [address] [place] [address] Phone: [phone] Fax: [phone]  [facility] [address], [place] [address] Phone: [phone] Fax: [phone]  [id number] [place] [id number]: [phone]: [phone] Follow Up:  Patient agrees to Care Plan and Follow-up.  Plan: No further follow up required:     JRegina Eck PharmD, BCPS Clinical Pharmacist, [place] Phone [phone]

## 2021-12-04 ENCOUNTER — Telehealth: Payer: Self-pay | Admitting: Pharmacist

## 2021-12-04 DIAGNOSIS — E782 Mixed hyperlipidemia: Secondary | ICD-10-CM

## 2021-12-04 MED ORDER — PRALUENT 150 MG/ML ~~LOC~~ SOAJ: 75.0000 mg | SUBCUTANEOUS | 4 refills | Status: DC

## 2021-12-04 NOTE — Telephone Encounter (Signed)
REPATHA PA DENIED MUST TRY AND FAIL PRALUENT WILL SEND IN RX FOR [place] (EQUIVALENT TO REPATHA)  PLAN: [place] Primary hypercholesterolemia, Alone or in combination with other lipid-lowering therapies Every 2 weeks regimen: Initial, 75 mg subQ once every 2 weeks; may increase after 4 to 8 weeks to MAX 150 mg every 2 weeks [4]

## 2021-12-05 MED ORDER — PRALUENT 150 MG/ML ~~LOC~~ SOAJ: 150.0000 mg | SUBCUTANEOUS | 5 refills | Status: DC

## 2021-12-05 NOTE — Telephone Encounter (Signed)
PRALUENT CALLED IN (SINCE IT'S FORMULARY)

## 2021-12-05 NOTE — Addendum Note (Signed)
Addended by: Lottie Dawson D on: 12/05/2021 09:47 AM   Modules accepted: Orders

## 2021-12-06 ENCOUNTER — Telehealth: Payer: Self-pay | Admitting: Pharmacist

## 2021-12-06 NOTE — Telephone Encounter (Signed)
PA PENDING FOR PRALUENT PLEASE FOLLOW

## 2021-12-06 NOTE — Telephone Encounter (Signed)
Kimbery Vaccaro Key: [id number] - PA Case ID: [id number] - Rx #: [id number] Need help? Call us at [phone] Outcome Approvedtoday Your request has been approved Drug Praluent 150MG /ML auto-injectors  [facility]

## 2021-12-11 ENCOUNTER — Encounter: Payer: Self-pay | Admitting: Nurse Practitioner

## 2021-12-11 ENCOUNTER — Ambulatory Visit (INDEPENDENT_AMBULATORY_CARE_PROVIDER_SITE_OTHER): Payer: Medicare HMO | Admitting: Nurse Practitioner

## 2021-12-11 VITALS — BP 140/79 | HR 87 | Temp 96.9°F | Resp 20 | Ht 65.0 in | Wt 185.0 lb

## 2021-12-11 DIAGNOSIS — M79671 Pain in right foot: Secondary | ICD-10-CM

## 2021-12-11 MED ORDER — PREDNISONE 10 MG (21) PO TBPK: ORAL_TABLET | ORAL | 0 refills | Status: DC

## 2021-12-11 NOTE — Progress Notes (Signed)
° °  Subjective:    Patient ID: [name], female    DOB: 1951-04-24, 71 y.o.   MRN: [id number]   Chief Complaint: Foot Pain (Right foot/)   HPI Patient come in today c/o right foot pain. The pain is intermittent. Rates pain 4/10 currently. Can go up to a 10/10. Walking increases pain. Sitting relieves pain.    Review of Systems  Musculoskeletal:  Positive for arthralgias (right foot).      Objective:   Physical Exam Vitals reviewed.  Constitutional:      Appearance: Normal appearance.  Cardiovascular:     Rate and Rhythm: Normal rate and regular rhythm.     Heart sounds: Normal heart sounds.  Pulmonary:     Effort: Pulmonary effort is normal.     Breath sounds: Normal breath sounds.  Musculoskeletal:     Comments: Pain on palpation and slight edema at base of right 2nd and 3rd toe.  Skin:    General: Skin is warm.  Neurological:     General: No focal deficit present.     Mental Status: She is alert and oriented to person, place, and time.  Psychiatric:        Mood and Affect: Mood normal.        Behavior: Behavior normal.    BP 140/79    Pulse 87    Temp (!) 96.9 F (36.1 C) (Temporal)    Resp 20    Ht 5\' 5"  (1.651 m)    Wt 185 lb (83.9 kg)    SpO2 97%    BMI 30.79 kg/m        Assessment & Plan:   [name] in today with chief complaint of Foot Pain (Right foot/)   1. Right foot pain Ice bid Elevate when sitting Meds ordered this encounter  Medications   predniSONE (STERAPRED UNI-PAK 21 TAB) 10 MG (21) TBPK tablet    Sig: As directed x 6 days    Dispense:  21 tablet    Refill:  0    Order Specific Question:   Supervising Provider    Answer:   Caryl Pina A [[id number]]       The above assessment and management plan was discussed with the patient. The patient verbalized understanding of and has agreed to the management plan. Patient is aware to call the clinic if symptoms persist or worsen. Patient is aware when to return to the clinic for a  follow-up visit. Patient educated on when it is appropriate to go to the emergency department.   Mary-Margaret Hassell Done, FNP

## 2021-12-11 NOTE — Patient Instructions (Signed)
Foot Pain °Many things can cause foot pain. Some common causes are: °An injury. °A sprain. °Arthritis. °Blisters. °Bunions. °Follow these instructions at home: °Managing pain, stiffness, and swelling °If directed, put ice on the painful area: °Put ice in a plastic bag. °Place a towel between your skin and the bag. °Leave the ice on for 20 minutes, 2-3 times a day. ° °Activity °Do not stand or walk for long periods. °Return to your normal activities as told by your health care provider. Ask your health care provider what activities are safe for you. °Do stretches to relieve foot pain and stiffness as told by your health care provider. °Do not lift anything that is heavier than 10 lb (4.5 kg), or the limit that you are told, until your health care provider says that it is safe. Lifting a lot of weight can put added pressure on your feet. °Lifestyle °Wear comfortable, supportive shoes that fit you well. Do not wear high heels. °Keep your feet clean and dry. °General instructions °Take over-the-counter and prescription medicines only as told by your health care provider. °Rub your foot gently. °Pay attention to any changes in your symptoms. °Keep all follow-up visits as told by your health care provider. This is important. °Contact a health care provider if: °Your pain does not get better after a few days of self-care. °Your pain gets worse. °You cannot stand on your foot. °Get help right away if: °Your foot is numb or tingling. °Your foot or toes are swollen. °Your foot or toes turn white or blue. °You have warmth and redness along your foot. °Summary °Common causes of foot pain are injury, sprain, arthritis, blisters, or bunions. °Ice, medicines, and comfortable shoes may help foot pain. °Contact your health care provider if your pain does not get better after a few days of self-care. °This information is not intended to replace advice given to you by your health care provider. Make sure you discuss any questions you  have with your health care provider. °Document Revised: 02/14/2021 Document Reviewed: 02/14/2021 °Elsevier Patient Education © 2022 Elsevier Inc. ° °

## 2021-12-25 DIAGNOSIS — E782 Mixed hyperlipidemia: Secondary | ICD-10-CM

## 2022-01-10 DIAGNOSIS — L7211 Pilar cyst: Secondary | ICD-10-CM | POA: Diagnosis not present

## 2022-01-10 DIAGNOSIS — L905 Scar conditions and fibrosis of skin: Secondary | ICD-10-CM | POA: Diagnosis not present

## 2022-01-10 DIAGNOSIS — L821 Other seborrheic keratosis: Secondary | ICD-10-CM | POA: Diagnosis not present

## 2022-01-10 DIAGNOSIS — C44319 Basal cell carcinoma of skin of other parts of face: Secondary | ICD-10-CM | POA: Diagnosis not present

## 2022-01-25 ENCOUNTER — Telehealth: Payer: Self-pay | Admitting: Pharmacist

## 2022-01-31 DIAGNOSIS — Z85828 Personal history of other malignant neoplasm of skin: Secondary | ICD-10-CM | POA: Diagnosis not present

## 2022-01-31 DIAGNOSIS — Z08 Encounter for follow-up examination after completed treatment for malignant neoplasm: Secondary | ICD-10-CM | POA: Diagnosis not present

## 2022-01-31 DIAGNOSIS — L820 Inflamed seborrheic keratosis: Secondary | ICD-10-CM | POA: Diagnosis not present

## 2022-02-18 ENCOUNTER — Telehealth: Payer: Self-pay

## 2022-02-18 MED ORDER — NYSTATIN 100000 UNIT/ML MT SUSP: 5.0000 mL | Freq: Four times a day (QID) | OROMUCOSAL | 2 refills | Status: DC

## 2022-02-18 NOTE — Telephone Encounter (Signed)
MMM sent in mouthwash. Pt made aware. ?

## 2022-02-18 NOTE — Telephone Encounter (Signed)
Needs magic mouth wash sent in per dermatologist for mouth ulcers.  ?

## 2022-02-19 ENCOUNTER — Ambulatory Visit (INDEPENDENT_AMBULATORY_CARE_PROVIDER_SITE_OTHER): Payer: Medicare HMO | Admitting: Nurse Practitioner

## 2022-02-19 ENCOUNTER — Encounter: Payer: Self-pay | Admitting: Nurse Practitioner

## 2022-02-19 VITALS — BP 130/79 | HR 81 | Temp 97.5°F | Resp 20 | Ht 65.0 in | Wt 182.0 lb

## 2022-02-19 DIAGNOSIS — C44310 Basal cell carcinoma of skin of unspecified parts of face: Secondary | ICD-10-CM

## 2022-02-19 DIAGNOSIS — Z831 Family history of other infectious and parasitic diseases: Secondary | ICD-10-CM

## 2022-02-19 MED ORDER — VALACYCLOVIR HCL 1 G PO TABS: ORAL_TABLET | ORAL | 0 refills | Status: DC

## 2022-02-19 NOTE — Patient Instructions (Signed)
Basal Cell Carcinoma ?Basal cell carcinoma is the most common form of skin cancer. It begins in the basal cells, which are at the bottom of the outer skin layer (epidermis). Basal cell carcinoma can often be cured. It rarely spreads to other areas of the body (metastasizes). It may come back at the same location (recur), but it can be treated again if this happens. ?Basal cell carcinoma occurs most often on parts of the body that are frequently exposed to the sun, such as: ?Parts of the head, including the scalp or face. ?Ears. ?Neck. ?Arms or legs. ?Backs of the hands. ?What are the causes? ?This condition is usually caused by exposure to ultraviolet (UV) light. UV light may come from the sun or from tanning beds. Other causes include: ?Exposure to arsenic, a highly poisonous metal. ?Exposure to high-energy X-rays (radiation). ?Exposure to toxic tars and oils. ?Certain genetic conditions, such as a condition that makes a person sensitive to sunlight. ?What increases the risk? ?You are more likely to develop this condition if: ?You are older than 69 years of age. ?You have: ?Fair skin (light complexion), blond or red hair, or blue, green, or gray eye color. ?Childhood freckling. ?Had repeated sunburns or sun exposure over long periods of time, especially during childhood. ?A weakened body defense system (immune system). ?Been exposed to certain chemicals, such as tar, soot, and arsenic. ?Chronic inflammatory conditions or infections. ?A family or personal history of basal cell carcinoma. ?You use tanning beds. ?What are the signs or symptoms? ?The main symptom of this condition is a growth or lesion on the skin. ?The shape and color of the growth or lesion may vary. The main types include: ?An open sore that may remain open for three weeks or longer. The sore may bleed or crust. This type of lesion can be an early sign of basal cell carcinoma. Basal cell carcinoma often shows up as a sore that does not heal. ?A  reddish area that may crust, itch, or cause discomfort. This may occur on areas that are exposed to the sun. These patches might be easier to feel than to see. ?A shiny or clear bump that is red, white, or pink. In people who have dark hair, the bump is often tan, black, or brown. These bumps can look like moles. ?A pink growth with a raised border. The growth will have a crusted and indented area in the center. Small blood vessels may appear on the surface of the growth as it gets bigger. ?A scar-like area that looks like shiny, stretched skin. The area may be white, yellow, or waxy. It often has irregular borders. This may be a sign of more aggressive basal cell carcinoma. ?How is this diagnosed? ?This condition may be diagnosed with: ?A physical exam. ?Removal of a tissue sample to be examined under a microscope (biopsy). ?How is this treated? ?Treatment for this condition involves removing the cancerous tissue. The method that is used for this depends on the type, size, location, and number of tumors. Possible treatments include: ?Surgery, such as: ?Mohs surgery. In this procedure, the cancerous skin cells are removed layer by layer until all of the tumor has been removed. ?Surgical removal (excision) of the tumor. This involves removing the entire tumor and a small amount of normal skin that surrounds it. ?Cryosurgery. This involves freezing the tumor with liquid nitrogen. ?Plastic surgery. The tumor is removed, and healthy skin from another part of the body is used to cover the wound. This  may be done for large tumors that are in areas where it is not possible to stretch the nearby skin to sew the edges of the wound together. ?Therapies or treatments, such as: ?Radiation. This may be used for tumors on the face. ?Photodynamic therapy. A chemical cream is applied to the skin, and light exposure is used to activate the chemical. ?Electrodesiccation and curettage. This involves alternately scraping and burning  the tumor while using an electric current to control bleeding. ?Chemical treatments, such as imiquimod cream and interferon injections. These may be used to remove superficial tumors with minimal scarring. ?Follow these instructions at home: ?Avoid direct exposure to the sun. ?Do self-exams as told by your health care provider. Look for new spots or changes in your skin. ?Keep all follow-up visits. This is important. ?How is this prevented? ? ?Avoid the sun when it is the strongest. This is usually between 10 a.m. and 4 p.m. ?When you are out in the sun, use a sunscreen that has a sun protection factor (SPF) of at least 72. ?Apply sunscreen at least 30 minutes before exposure to the sun. ?Reapply sunscreen every 2-4 hours while you are outside. Also reapply it after swimming and after excessive sweating. ?Always wear hats, protective clothing, and UV-blocking sunglasses when you are outdoors. ?Do not use tanning beds. ?Contact a health care provider if: ?You notice any new spots or any changes in your skin. ?You have had a basal cell carcinoma tumor removed, and you notice a new growth in the same location. ?Get help right away if: ?You have a spot that is sore and does not heal. ?You have a spot that bleeds easily. ?Summary ?Basal cell carcinoma is the most common form of skin cancer. It begins in the bottom of the outer skin layer (epidermis). Basal cell carcinoma can almost always be cured. ?This condition is usually caused by exposure to ultraviolet (UV) light. It mostly affects the face, scalp, neck, ears, arms, legs, or backs of the hands. ?The main symptom of this condition is a growth or lesion on the skin that can vary in shape and color. ?You can prevent this cancer by avoiding direct exposure to the sun, applying sunscreen of at least 30 SPF, and wearing protective clothing. ?Apply sunscreen 30 minutes before you go out into the sun, and reapply every 2-4 hours while you are outside. ?This information is  not intended to replace advice given to you by your health care provider. Make sure you discuss any questions you have with your health care provider. ?Document Revised: 03/15/2021 Document Reviewed: 03/15/2021 ?Elsevier Patient Education ? [place]. ? ?

## 2022-02-19 NOTE — Progress Notes (Signed)
? ?  Subjective:  ? ? Patient ID: [name], female    DOB: 01-22-1951, 71 y.o.   MRN: [id number] ? ? ?Chief Complaint: Check areas to face  (Swelling to lips/) ? ? ?HPI ?Patient went to see DR. Nevada Crane, dermatologist and had some skin bx on face. Was dx with basal cell carcinoma with unclear margins. She was given imiquimod topical for treatment. She was told that the cream would search out cancer cells and she has had a few red spots pop up on face. Since using cream her lip has broken out in sores. ? ? ?Review of Systems  ?Constitutional:  Negative for diaphoresis.  ?Eyes:  Negative for pain.  ?Respiratory:  Negative for shortness of breath.   ?Cardiovascular:  Negative for chest pain, palpitations and leg swelling.  ?Gastrointestinal:  Negative for abdominal pain.  ?Endocrine: Negative for polydipsia.  ?Skin:  Negative for rash.  ?Neurological:  Negative for dizziness, weakness and headaches.  ?Hematological:  Does not bruise/bleed easily.  ?All other systems reviewed and are negative. ? ?   ?Objective:  ? Physical Exam ?Constitutional:   ?   Appearance: Normal appearance.  ?HENT:  ?   Mouth/Throat:  ?   Comments: Vesicular lesions all across lower lip ?Skin: ?   General: Skin is warm.  ?   Comments: Erythematous lesions on both cheeks  ?Neurological:  ?   General: No focal deficit present.  ?   Mental Status: She is alert and oriented to person, place, and time.  ?Psychiatric:     ?   Mood and Affect: Mood normal.     ?   Behavior: Behavior normal.  ? ? ?BP 130/79   Pulse 81   Temp (!) 97.5 ?F (36.4 ?C) (Temporal)   Resp 20   Ht '5\' 5"'$  (1.651 m)   Wt 182 lb (82.6 kg)   SpO2 97%   BMI 30.29 kg/m?  ? ? ? ?   ?Assessment & Plan:  ? ?Riki Berninger Larkin in today with chief complaint of Check areas to face  (Swelling to lips/) ? ? ?1. Family history of cold sores ?- valACYclovir (VALTREX) 1000 MG tablet; 2 po bid today  Dispense: 20 tablet; Refill: 0 ? ?2. Basal cell carcinoma (BCC) of face ?- Ambulatory referral to  Dermatology ? ? ? ?The above assessment and management plan was discussed with the patient. The patient verbalized understanding of and has agreed to the management plan. Patient is aware to call the clinic if symptoms persist or worsen. Patient is aware when to return to the clinic for a follow-up visit. Patient educated on when it is appropriate to go to the emergency department.  ? ?Mary-Margaret Hassell Done, FNP ? ? ?

## 2022-02-22 DIAGNOSIS — C44329 Squamous cell carcinoma of skin of other parts of face: Secondary | ICD-10-CM | POA: Diagnosis not present

## 2022-02-22 DIAGNOSIS — D485 Neoplasm of uncertain behavior of skin: Secondary | ICD-10-CM | POA: Diagnosis not present

## 2022-02-22 DIAGNOSIS — C44319 Basal cell carcinoma of skin of other parts of face: Secondary | ICD-10-CM | POA: Diagnosis not present

## 2022-04-03 DIAGNOSIS — C44319 Basal cell carcinoma of skin of other parts of face: Secondary | ICD-10-CM | POA: Diagnosis not present

## 2022-04-03 DIAGNOSIS — D0439 Carcinoma in situ of skin of other parts of face: Secondary | ICD-10-CM | POA: Diagnosis not present

## 2022-04-14 ENCOUNTER — Other Ambulatory Visit: Payer: Self-pay | Admitting: Nurse Practitioner

## 2022-04-23 ENCOUNTER — Ambulatory Visit (INDEPENDENT_AMBULATORY_CARE_PROVIDER_SITE_OTHER): Payer: Medicare HMO | Admitting: Nurse Practitioner

## 2022-04-23 ENCOUNTER — Other Ambulatory Visit (INDEPENDENT_AMBULATORY_CARE_PROVIDER_SITE_OTHER): Payer: Medicare HMO

## 2022-04-23 ENCOUNTER — Encounter: Payer: Self-pay | Admitting: Nurse Practitioner

## 2022-04-23 VITALS — BP 134/77 | HR 86 | Temp 97.7°F | Resp 20 | Ht 65.0 in | Wt 183.0 lb

## 2022-04-23 DIAGNOSIS — M2669 Other specified disorders of temporomandibular joint: Secondary | ICD-10-CM

## 2022-04-23 DIAGNOSIS — R6884 Jaw pain: Secondary | ICD-10-CM | POA: Diagnosis not present

## 2022-04-23 DIAGNOSIS — E782 Mixed hyperlipidemia: Secondary | ICD-10-CM

## 2022-04-23 MED ORDER — DIAZEPAM 5 MG PO TABS: 5.0000 mg | ORAL_TABLET | Freq: Every day | ORAL | 5 refills | Status: DC

## 2022-04-23 NOTE — Addendum Note (Signed)
Addended by: Chevis Pretty on: 04/23/2022 03:33 PM   Modules accepted: Orders

## 2022-04-23 NOTE — Patient Instructions (Addendum)
Temporomandibular Joint Syndrome  Temporomandibular joint syndrome (TMJ syndrome) is a condition that causes pain in the temporomandibular joints. These joints are located near your ears and allow your jaw to open and close. For people with TMJ syndrome, chewing, biting, or other movements of the jaw can be difficult or painful. TMJ syndrome is often mild and goes away within a few weeks. However, sometimes the condition becomes a long-term (chronic) problem. What are the causes? This condition may be caused by: Grinding your teeth or clenching your jaw. Some people do this when they are stressed. Arthritis. An injury to the jaw. A head or neck injury. Teeth or dentures that are not aligned well. In some cases, the cause of TMJ syndrome may not be known. What are the signs or symptoms? The most common symptom of this condition is aching pain on the side of the head in the area of the TMJ. Other symptoms may include: Pain when moving your jaw, such as when chewing or biting. Not being able to open your jaw all the way. Making a clicking sound when you open your mouth. Headache. Earache. Neck or shoulder pain. How is this diagnosed? This condition may be diagnosed based on: Your symptoms and medical history. A physical exam. Your health care provider may check the range of motion of your jaw. Imaging tests, such as X-rays or an MRI. You may also need to see your dentist, who will check if your teeth and jaw are lined up correctly. How is this treated? TMJ syndrome often goes away on its own. If treatment is needed, it may include: Eating soft foods and applying ice or heat. Medicines to relieve pain or inflammation. Medicines or massage to relax the muscles. A splint, bite plate, or mouthpiece to prevent teeth grinding or jaw clenching. Relaxation techniques or counseling to help reduce stress. A therapy for pain in which an electrical current is applied to the nerves through the skin  (transcutaneous electrical nerve stimulation). Acupuncture. This may help to relieve pain. Jaw surgery. This is rarely needed. Follow these instructions at home:  Eating and drinking Eat a soft diet if you are having trouble chewing. Avoid foods that require a lot of chewing. Do not chew gum. General instructions Take over-the-counter and prescription medicines only as told by your health care provider. If directed, put ice on the painful area. To do this: Put ice in a plastic bag. Place a towel between your skin and the bag. Leave the ice on for 20 minutes, 2-3 times a day. Remove the ice if your skin turns bright red. This is very important. If you cannot feel pain, heat, or cold, you have a greater risk of damage to the area. Apply a warm, wet cloth (warm compress) to the painful area as told. Massage your jaw area and do any jaw stretching exercises as told by your health care provider. If you were given a splint, bite plate, or mouthpiece, wear it as told by your health care provider. Keep all follow-up visits. This is important. Where to find more information National Institute of Dental and Craniofacial Research: www.nidcr.nih.gov Contact a health care provider if: You have trouble eating. You have new or worsening symptoms. Get help right away if: Your jaw locks. Summary Temporomandibular joint syndrome (TMJ syndrome) is a condition that causes pain in the temporomandibular joints. These joints are located near your ears and allow your jaw to open and close. TMJ syndrome is often mild and goes away within   a few weeks. However, sometimes the condition becomes a long-term (chronic) problem. Symptoms include an aching pain on the side of the head in the area of the TMJ, pain when chewing or biting, and being unable to open your jaw all the way. You may also make a clicking sound when you open your mouth. TMJ syndrome often goes away on its own. If treatment is needed, it may  include medicines to relieve pain, reduce inflammation, or relax the muscles. A splint, bite plate, or mouthpiece may also be used to prevent teeth grinding or jaw clenching. This information is not intended to replace advice given to you by your health care provider. Make sure you discuss any questions you have with your health care provider. Document Revised: 06/24/2021 Document Reviewed: 06/24/2021 Elsevier Patient Education  2023 Elsevier Inc.  

## 2022-04-23 NOTE — Progress Notes (Signed)
Subjective:    Patient ID: [name], female    DOB: 13-Nov-1951, 71 y.o.   MRN: [id number]   Chief Complaint: Facial Swelling (Unable to open mouth/)   HPI Patient comes in c/o facial swelling and trouble opening her mouth. She had skin cancer surgery on that side several months ago. She has had trouble opening er mouth every since she had surgery. They did not do anything to her actual bine when she had surgery, it was all skin. Right lower jaw swelling. No pain with eating. She says she does grind her teeth at night.   Review of Systems  Constitutional:  Negative for diaphoresis.  Eyes:  Negative for pain.  Respiratory:  Negative for shortness of breath.   Cardiovascular:  Negative for chest pain, palpitations and leg swelling.  Gastrointestinal:  Negative for abdominal pain.  Endocrine: Negative for polydipsia.  Skin:  Negative for rash.  Neurological:  Negative for dizziness, weakness and headaches.  Hematological:  Does not bruise/bleed easily.  All other systems reviewed and are negative.     Objective:   Physical Exam Vitals and nursing note reviewed.  Constitutional:      General: She is not in acute distress.    Appearance: Normal appearance. She is well-developed.  HENT:     Right Ear: Tympanic membrane normal.     Left Ear: Tympanic membrane normal.     Mouth/Throat:     Comments: Limited movement of right TMJ Neck:     Vascular: No carotid bruit or JVD.  Cardiovascular:     Rate and Rhythm: Normal rate and regular rhythm.     Heart sounds: Normal heart sounds.  Pulmonary:     Effort: Pulmonary effort is normal. No respiratory distress.     Breath sounds: Normal breath sounds. No wheezing or rales.  Chest:     Chest wall: No tenderness.  Abdominal:     General: Bowel sounds are normal. There is no distension or abdominal bruit.     Palpations: Abdomen is soft. There is no hepatomegaly, splenomegaly, mass or pulsatile mass.     Tenderness: There is no  abdominal tenderness.  Musculoskeletal:        General: Normal range of motion.     Cervical back: Normal range of motion and neck supple.  Lymphadenopathy:     Cervical: No cervical adenopathy.  Skin:    General: Skin is warm and dry.     Comments: Healing scar on right check  Neurological:     Mental Status: She is alert and oriented to person, place, and time.     Deep Tendon Reflexes: Reflexes are normal and symmetric.  Psychiatric:        Behavior: Behavior normal.        Thought Content: Thought content normal.        Judgment: Judgment normal.    BP 134/77   Pulse 86   Temp 97.7 F (36.5 C) (Temporal)   Resp 20   Ht '5\' 5"'$  (1.651 m)   Wt 183 lb (83 kg)   SpO2 93%   BMI 30.45 kg/m        Assessment & Plan:  [name] in today with chief complaint of Facial Swelling (Unable to open mouth/)   1. Jaw pain  - DG Mandible 4 Views  2. 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 - diazepam (VALIUM) 5  MG tablet; Take 1 tablet (5 mg total) by mouth at bedtime.  Dispense: 30 tablet; Refill: 5    The above assessment and management plan was discussed with the patient. The patient verbalized understanding of and has agreed to the management plan. Patient is aware to call the clinic if symptoms persist or worsen. Patient is aware when to return to the clinic for a follow-up visit. Patient educated on when it is appropriate to go to the emergency department.   Mary-Margaret Hassell Done, FNP

## 2022-04-24 LAB — CMP14+EGFR
ALT: 22 IU/L (ref 0–32)
AST: 31 IU/L (ref 0–40)
Albumin/Globulin Ratio: 1.8 (ref 1.2–2.2)
Albumin: 4.3 g/dL (ref 3.8–4.8)
Alkaline Phosphatase: 124 IU/L — ABNORMAL HIGH (ref 44–121)
BUN/Creatinine Ratio: 11 — ABNORMAL LOW (ref 12–28)
BUN: 11 mg/dL (ref 8–27)
Bilirubin Total: 0.3 mg/dL (ref 0.0–1.2)
CO2: 22 mmol/L (ref 20–29)
Calcium: 9.4 mg/dL (ref 8.7–10.3)
Chloride: 106 mmol/L (ref 96–106)
Creatinine, Ser: 0.99 mg/dL (ref 0.57–1.00)
Globulin, Total: 2.4 g/dL (ref 1.5–4.5)
Glucose: 99 mg/dL (ref 70–99)
Potassium: 4.3 mmol/L (ref 3.5–5.2)
Sodium: 142 mmol/L (ref 134–144)
Total Protein: 6.7 g/dL (ref 6.0–8.5)
eGFR: 62 mL/min/{1.73_m2} (ref >59)

## 2022-04-24 LAB — LIPID PANEL
Chol/HDL Ratio: 5 ratio — ABNORMAL HIGH (ref 0.0–4.4)
Cholesterol, Total: 212 mg/dL — ABNORMAL HIGH (ref 100–199)
HDL: 42 mg/dL (ref >39)
LDL Chol Calc (NIH): 122 mg/dL — ABNORMAL HIGH (ref 0–99)
Triglycerides: 273 mg/dL — ABNORMAL HIGH (ref 0–149)
VLDL Cholesterol Cal: 48 mg/dL — ABNORMAL HIGH (ref 5–40)

## 2022-04-25 ENCOUNTER — Other Ambulatory Visit: Payer: Self-pay | Admitting: Nurse Practitioner

## 2022-04-25 DIAGNOSIS — E782 Mixed hyperlipidemia: Secondary | ICD-10-CM

## 2022-05-02 ENCOUNTER — Telehealth: Payer: Medicare HMO

## 2022-05-20 DIAGNOSIS — L905 Scar conditions and fibrosis of skin: Secondary | ICD-10-CM | POA: Diagnosis not present

## 2022-05-20 DIAGNOSIS — C44319 Basal cell carcinoma of skin of other parts of face: Secondary | ICD-10-CM | POA: Diagnosis not present

## 2022-05-25 DIAGNOSIS — H52223 Regular astigmatism, bilateral: Secondary | ICD-10-CM | POA: Diagnosis not present

## 2022-05-25 DIAGNOSIS — H524 Presbyopia: Secondary | ICD-10-CM | POA: Diagnosis not present

## 2022-06-30 ENCOUNTER — Other Ambulatory Visit: Payer: Self-pay | Admitting: Nurse Practitioner

## 2022-06-30 DIAGNOSIS — E782 Mixed hyperlipidemia: Secondary | ICD-10-CM

## 2022-07-19 ENCOUNTER — Other Ambulatory Visit: Payer: Self-pay | Admitting: Nurse Practitioner

## 2022-07-19 MED ORDER — ONDANSETRON HCL 4 MG PO TABS: 4.0000 mg | ORAL_TABLET | Freq: Three times a day (TID) | ORAL | 0 refills | Status: DC | PRN

## 2022-07-19 NOTE — Progress Notes (Signed)
ofran

## 2022-07-22 ENCOUNTER — Ambulatory Visit (INDEPENDENT_AMBULATORY_CARE_PROVIDER_SITE_OTHER): Payer: Medicare HMO | Admitting: Nurse Practitioner

## 2022-07-22 ENCOUNTER — Encounter: Payer: Self-pay | Admitting: Nurse Practitioner

## 2022-07-22 VITALS — BP 139/70 | HR 82 | Temp 97.0°F | Resp 20 | Ht 65.0 in | Wt 184.0 lb

## 2022-07-22 DIAGNOSIS — F411 Generalized anxiety disorder: Secondary | ICD-10-CM | POA: Diagnosis not present

## 2022-07-22 DIAGNOSIS — R112 Nausea with vomiting, unspecified: Secondary | ICD-10-CM

## 2022-07-22 DIAGNOSIS — R69 Illness, unspecified: Secondary | ICD-10-CM | POA: Diagnosis not present

## 2022-07-22 MED ORDER — PANTOPRAZOLE SODIUM 20 MG PO TBEC: 20.0000 mg | DELAYED_RELEASE_TABLET | Freq: Every day | ORAL | 2 refills | Status: DC

## 2022-07-22 MED ORDER — ESCITALOPRAM OXALATE 20 MG PO TABS: 20.0000 mg | ORAL_TABLET | Freq: Every day | ORAL | 5 refills | Status: DC

## 2022-07-22 NOTE — Patient Instructions (Signed)

## 2022-07-22 NOTE — Progress Notes (Signed)
Subjective:    Patient ID: [name], female    DOB: 12-Sep-1951, 71 y.o.   MRN: [id number]   Chief Complaint: Nausea and Emesis   Emesis  Pertinent negatives include no abdominal pain, chest pain, dizziness or headaches.   Patient has been having episodes of intermittent nausea and vomiting. Occurs more when she is stressed out. Had bad episode last week. Zofran helps. She just dont feel good. Says that she is sure that' \\her'$  anxiety is cauing some issues.    07/22/2022   11:04 AM 10/24/2021    8:49 AM 04/24/2021   10:55 AM 05/21/2019   11:04 AM  GAD 7 : Generalized Anxiety Score  Nervous, Anxious, on Edge 0 '1 1 1  '$ Control/stop worrying 0 0 0 1  Worry too much - different things 0 0 0 1  Trouble relaxing 0 1 1 0  Restless 0 0 1 0  Easily annoyed or irritable 0 '2 1 1  '$ Afraid - awful might happen 0 0 0 0  Total GAD 7 Score 0 '4 4 4  '$ Anxiety Difficulty  Somewhat difficult Somewhat difficult Not difficult at all        Review of Systems  Constitutional:  Negative for diaphoresis.  Eyes:  Negative for pain.  Respiratory:  Negative for shortness of breath.   Cardiovascular:  Negative for chest pain, palpitations and leg swelling.  Gastrointestinal:  Positive for vomiting. Negative for abdominal pain.  Endocrine: Negative for polydipsia.  Skin:  Negative for rash.  Neurological:  Negative for dizziness, weakness and headaches.  Hematological:  Does not bruise/bleed easily.  All other systems reviewed and are negative.      Objective:   Physical Exam Constitutional:      Appearance: Normal appearance.  Cardiovascular:     Rate and Rhythm: Normal rate and regular rhythm.     Heart sounds: Normal heart sounds.  Pulmonary:     Effort: Pulmonary effort is normal.     Breath sounds: Normal breath sounds.  Skin:    General: Skin is warm and dry.  Neurological:     General: No focal deficit present.     Mental Status: She is alert and oriented to person, place, and  time.  Psychiatric:        Mood and Affect: Mood normal.        Behavior: Behavior normal.     BP 139/70   Pulse 82   Temp (!) 97 F (36.1 C) (Oral)   Resp 20   Ht '5\' 5"'$  (1.651 m)   Wt 184 lb (83.5 kg)   SpO2 95%   BMI 30.62 kg/m        Assessment & Plan:   Shondra Capps Doetsch in today with chief complaint of Nausea and Emesis   1. GAD (generalized anxiety disorder) Stress management Increase lexapro '20mg'$  daily Take xanax as needed - escitalopram (LEXAPRO) 20 MG tablet; Take 1 tablet (20 mg total) by mouth daily.  Dispense: 30 tablet; Refill: 5  2. Nausea and vomiting, unspecified vomiting type Avoid spicy and fatty foods - pantoprazole (PROTONIX) 20 MG tablet; Take 1 tablet (20 mg total) by mouth daily.  Dispense: 30 tablet; Refill: 2    The above assessment and management plan was discussed with the patient. The patient verbalized understanding of and has agreed to the management plan. Patient is aware to call the clinic if symptoms persist or worsen. Patient is aware when to return to the clinic for  a follow-up visit. Patient educated on when it is appropriate to go to the emergency department.   Mary-Margaret Hassell Done, FNP

## 2022-09-30 ENCOUNTER — Encounter: Payer: Self-pay | Admitting: Nurse Practitioner

## 2022-09-30 ENCOUNTER — Ambulatory Visit (INDEPENDENT_AMBULATORY_CARE_PROVIDER_SITE_OTHER): Payer: Medicare HMO | Admitting: Nurse Practitioner

## 2022-09-30 VITALS — BP 121/78 | HR 76 | Temp 96.2°F | Resp 20 | Ht 65.0 in | Wt 182.0 lb

## 2022-09-30 DIAGNOSIS — E8881 Metabolic syndrome: Secondary | ICD-10-CM | POA: Diagnosis not present

## 2022-09-30 DIAGNOSIS — R69 Illness, unspecified: Secondary | ICD-10-CM | POA: Diagnosis not present

## 2022-09-30 DIAGNOSIS — E782 Mixed hyperlipidemia: Secondary | ICD-10-CM

## 2022-09-30 DIAGNOSIS — U099 Post covid-19 condition, unspecified: Secondary | ICD-10-CM

## 2022-09-30 DIAGNOSIS — F411 Generalized anxiety disorder: Secondary | ICD-10-CM | POA: Diagnosis not present

## 2022-09-30 DIAGNOSIS — K219 Gastro-esophageal reflux disease without esophagitis: Secondary | ICD-10-CM | POA: Diagnosis not present

## 2022-09-30 DIAGNOSIS — R053 Chronic cough: Secondary | ICD-10-CM

## 2022-09-30 DIAGNOSIS — Z6830 Body mass index (BMI) 30.0-30.9, adult: Secondary | ICD-10-CM | POA: Diagnosis not present

## 2022-09-30 MED ORDER — PRALUENT 150 MG/ML ~~LOC~~ SOAJ: SUBCUTANEOUS | 2 refills | Status: DC

## 2022-09-30 MED ORDER — PANTOPRAZOLE SODIUM 20 MG PO TBEC: 20.0000 mg | DELAYED_RELEASE_TABLET | Freq: Every day | ORAL | 2 refills | Status: DC

## 2022-09-30 MED ORDER — ESCITALOPRAM OXALATE 20 MG PO TABS: 20.0000 mg | ORAL_TABLET | Freq: Every day | ORAL | 5 refills | Status: DC

## 2022-09-30 MED ORDER — BENZONATATE 100 MG PO CAPS: 100.0000 mg | ORAL_CAPSULE | Freq: Three times a day (TID) | ORAL | 0 refills | Status: DC | PRN

## 2022-09-30 NOTE — Patient Instructions (Signed)
Tos en los adultos Cough, Adult La tos ayuda a despejar la garganta y los pulmones. La tos puede ser un signo de una enfermedad u otra afeccin mdica. Una tos aguda puede durar [place] 2 o 3 semanas, mientras que una tos crnica puede durar 8 semanas o ms [employer]. Hay muchas cosas que pueden causar tos. Estas incluyen lo siguiente: Grmenes (virus o bacterias) que atacan las vas respiratorias. Inhalacin de cosas que alteran (irritan) los pulmones. Alergias. Asma. Mucosidad que se desliza por la parte posterior de la garganta (goteo posnasal). Fumar. cido que vuelve desde el estmago hacia el tubo que transporta los alimentos desde la boca hasta el estmago (reflujo gastroesofgico). Algunos medicamentos. Problemas pulmonares. Otras enfermedades, como insuficiencia cardaca o un cogulo de sangre en el pulmn (embolia pulmonar). Siga estas instrucciones en su casa: Medicamentos Tome los medicamentos de venta libre y los recetados solamente como se lo haya indicado el mdico. Hable con el mdico antes de tomar medicamentos que detienen la tos (antitusivos). Estilo de vida  No fume y trate de no estar cerca de humo. No consuma ningn producto que contenga nicotina o tabaco, como cigarrillos, cigarrillos electrnicos y tabaco de Higher education careers adviser. Si necesita ayuda para dejar de fumar, consulte al mdico. Beba suficiente lquido para mantener el pis (la [place]) de color amarillo plido. Evite la cafena. No beba alcohol si el mdico se lo prohbe. Instrucciones generales  Fjese si hay algn cambio en la tos. Informe a su mdico sobre ellos. Siempre cbrase la boca al toser. Aljese de las cosas que lo hagan toser, como perfumes, velas, humo de fogatas o productos de limpieza. Si el aire es [employer], use un humidificador o un vaporizador de niebla fra en su hogar. Si la tos empeora por la noche, pruebe con usar almohadas adicionales para elevar la cabeza mientras duerme. Descanse todo lo que sea  necesario. Concurra a todas las visitas de seguimiento como se lo haya indicado el mdico. Esto es importante. Comunquese con un mdico si: Aparecen nuevos sntomas. Tose y escupe pus. La tos no mejora despus de 2 o 3 semanas o empeora. Los medicamentos para la tos no [employer] tos y usted no duerme bien. Siente un dolor que empeora o un dolor que no se alivia con medicamentos. Tiene fiebre. Est adelgazando y no sabe por qu. [place] durante la noche. Solicite ayuda inmediatamente si: Tose y [employer]. Tiene dificultad para respirar. [place] [employer] late [place] rpido. Estos sntomas pueden Sales executive. No espere a ver si los sntomas desaparecen. Solicite atencin mdica de inmediato. Comunquese con el servicio de emergencias de su localidad (911 en los [place]). No conduzca por sus propios medios [employer]. Resumen La tos ayuda a despejar la garganta y los pulmones. Hay muchas cosas que pueden causar tos. Tome los medicamentos de venta libre y los recetados solamente como se lo haya indicado el mdico. Siempre cbrase la boca al toser. Comunquese con un mdico si tiene sntomas nuevos o tiene una tos que no mejora o que [place]. Esta informacin no tiene Marine scientist el consejo del mdico. Asegrese de hacerle al mdico cualquier pregunta que tenga. Document Revised: 01/06/2019 Document Reviewed: 01/06/2019 Elsevier Patient Education  [place].

## 2022-09-30 NOTE — Progress Notes (Signed)
Subjective:    Patient ID: [name], female    DOB: Sep 27, 1951, 71 y.o.   MRN: [id number]   Chief Complaint: Medical Management of Chronic Issues (Cough for 3 weeks/)    HPI:  [name] is a 71 y.o. who identifies as a female who was assigned female at birth.   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 Mixed hyperlipidemia Does not watch diet and does no dedicated exercise. Lab Results  Component Value Date   CHOL 212 (H) 04/23/2022   HDL 42 04/23/2022   LDLCALC 122 (H) 04/23/2022   TRIG 273 (H) 04/23/2022   CHOLHDL 5.0 (H) 04/23/2022   The 10-year ASCVD risk score (Arnett DK, et al., 2019) is: 7.6%   2. Metabolic syndrome Does not check blood sugars at home. Lab Results  Component Value Date   HGBA1C 5.2 10/24/2021       3. GAD (generalized anxiety disorder) Is on xanax as needed    09/30/2022    9:06 AM 07/22/2022   11:04 AM 10/24/2021    8:49 AM 04/24/2021   10:55 AM  GAD 7 : Generalized Anxiety Score  Nervous, Anxious, on Edge 0 0 1 1  Control/stop worrying 1 0 0 0  Worry too much - different things 1 0 0 0  Trouble relaxing 2 0 1 1  Restless 0 0 0 1  Easily annoyed or irritable 0 0 2 1  Afraid - awful might happen 0 0 0 0  Total GAD 7 Score 4 0 4 4  Anxiety Difficulty Somewhat difficult  Somewhat difficult Somewhat difficult      4. GERD Is on protonix daily and is doing well  5. BMI 30.0-30.9,adult No recent eight changes Wt Readings from Last 3 Encounters:  09/30/22 182 lb (82.6 kg)  07/22/22 184 lb (83.5 kg)  04/23/22 183 lb (83 kg)   BMI Readings from Last 3 Encounters:  09/30/22 30.29 kg/m  07/22/22 30.62 kg/m  04/23/22 30.45 kg/m      New complaints: Chronic cough since she had covid a month ago   Allergies  Allergen Reactions   Bee Venom Swelling   Rosuvastatin Other (See Comments)    myopathy   Lipitor  [Atorvastatin] Other (See Comments)    myopathy   Outpatient Encounter Medications as of 09/30/2022  Medication Sig   ALPRAZolam (XANAX) 0.5 MG tablet Take 1 tablet (0.5 mg total) by mouth 2 (two) times daily as needed for anxiety.   diazepam (VALIUM) 5 MG tablet Take 1 tablet (5 mg total) by mouth at bedtime.   escitalopram (LEXAPRO) 20 MG tablet Take 1 tablet (20 mg total) by mouth daily.   pantoprazole (PROTONIX) 20 MG tablet Take 1 tablet (20 mg total) by mouth daily.   [DISCONTINUED] ondansetron (ZOFRAN) 4 MG tablet Take 1 tablet (4 mg total) by mouth every 8 (eight) hours as needed for nausea or vomiting.   [DISCONTINUED] PRALUENT 150 MG/ML SOAJ INJECT 150 MG INTO THE SKIN EVERY 14 DAYS   No facility-administered encounter medications on file as of 09/30/2022.    Past Surgical History:  Procedure Laterality Date   BACK SURGERY     CESAREAN SECTION     KNEE SURGERY     Bilaterally    Family History  Problem Relation Age of Onset   Heart disease Mother    Hypertension Mother  Alcohol abuse Father    Breast cancer Neg Hx       Controlled substance contract: n/a        Review of Systems  Constitutional:  Negative for diaphoresis.  Eyes:  Negative for pain.  Respiratory:  Negative for shortness of breath.   Cardiovascular:  Negative for chest pain, palpitations and leg swelling.  Gastrointestinal:  Negative for abdominal pain.  Endocrine: Negative for polydipsia.  Skin:  Negative for rash.  Neurological:  Negative for dizziness, weakness and headaches.  Hematological:  Does not bruise/bleed easily.  All other systems reviewed and are negative.      Objective:   Physical Exam Vitals and nursing note reviewed.  Constitutional:      General: She is not in acute distress.    Appearance: Normal appearance. She is well-developed.  HENT:     Head: Normocephalic.     Right Ear: Tympanic membrane normal.     Left Ear: Tympanic membrane normal.     Nose: Nose  normal.     Mouth/Throat:     Mouth: Mucous membranes are moist.  Eyes:     Pupils: Pupils are equal, round, and reactive to light.  Neck:     Vascular: No carotid bruit or JVD.  Cardiovascular:     Rate and Rhythm: Normal rate and regular rhythm.     Heart sounds: Normal heart sounds.  Pulmonary:     Effort: Pulmonary effort is normal. No respiratory distress.     Breath sounds: Normal breath sounds. No wheezing or rales.  Chest:     Chest wall: No tenderness.  Abdominal:     General: Bowel sounds are normal. There is no distension or abdominal bruit.     Palpations: Abdomen is soft. There is no hepatomegaly, splenomegaly, mass or pulsatile mass.     Tenderness: There is no abdominal tenderness.  Musculoskeletal:        General: Normal range of motion.     Cervical back: Normal range of motion and neck supple.  Lymphadenopathy:     Cervical: No cervical adenopathy.  Skin:    General: Skin is warm and dry.  Neurological:     Mental Status: She is alert and oriented to person, place, and time.     Deep Tendon Reflexes: Reflexes are normal and symmetric.  Psychiatric:        Behavior: Behavior normal.        Thought Content: Thought content normal.        Judgment: Judgment normal.    BP 121/78   Pulse 76   Temp (!) 96.2 F (35.7 C) (Temporal)   Resp 20   Ht _0  (1.651 m)   Wt 182 lb (82.6 kg)   SpO2 96%   BMI 30.29 kg/m         Assessment & Plan:  [name] comes in today with chief complaint of Medical Management of Chronic Issues (Cough for 3 weeks/)   Diagnosis and orders addressed:  1. Mixed hyperlipidemia Low fat diet - Alirocumab (PRALUENT) 150 MG/ML SOAJ; INJECT 150 MG INTO THE SKIN EVERY 14 DAYS  Dispense: 2 mL; Refill: 2 - CBC with Differential/Platelet - CMP14+EGFR - Lipid panel  2. Metabolic syndrome Watch carb sin diet  3. GAD (generalized anxiety disorder) Stress management - escitalopram (LEXAPRO) 20 MG tablet; Take 1 tablet (20 mg  total) by mouth daily.  Dispense: 30 tablet; Refill: 5  4. Gastroesophageal reflux disease without esophagitis Avoid spicy  foods Do not eat 2 hours prior to bedtime - pantoprazole (PROTONIX) 20 MG tablet; Take 1 tablet (20 mg total) by mouth daily.  Dispense: 30 tablet; Refill: 2  5. BMI 30.0-30.9,adult Discussed diet and exercise for person with BMI >25 Will recheck weight in 3-6 months   6. Post-COVID chronic cough Tessalon perles   Labs pending Health Maintenance reviewed Diet and exercise encouraged  Follow up plan: 6 months   [place], FNP

## 2022-10-01 LAB — CBC WITH DIFFERENTIAL/PLATELET
Basophils Absolute: 0.1 10*3/uL (ref 0.0–0.2)
Basos: 1 %
EOS (ABSOLUTE): 0.3 10*3/uL (ref 0.0–0.4)
Eos: 5 %
Hematocrit: 40.4 % (ref 34.0–46.6)
Hemoglobin: 13.5 g/dL (ref 11.1–15.9)
Immature Grans (Abs): 0 10*3/uL (ref 0.0–0.1)
Immature Granulocytes: 0 %
Lymphocytes Absolute: 1.9 10*3/uL (ref 0.7–3.1)
Lymphs: 34 %
MCH: 29.7 pg (ref 26.6–33.0)
MCHC: 33.4 g/dL (ref 31.5–35.7)
MCV: 89 fL (ref 79–97)
Monocytes Absolute: 0.3 10*3/uL (ref 0.1–0.9)
Monocytes: 5 %
Neutrophils Absolute: 3.1 10*3/uL (ref 1.4–7.0)
Neutrophils: 55 %
Platelets: 225 10*3/uL (ref 150–450)
RBC: 4.54 x10E6/uL (ref 3.77–5.28)
RDW: 13.8 % (ref 11.7–15.4)
WBC: 5.7 10*3/uL (ref 3.4–10.8)

## 2022-10-01 LAB — CMP14+EGFR
ALT: 24 IU/L (ref 0–32)
AST: 33 IU/L (ref 0–40)
Albumin/Globulin Ratio: 1.9 (ref 1.2–2.2)
Albumin: 4.1 g/dL (ref 3.9–4.9)
Alkaline Phosphatase: 132 IU/L — ABNORMAL HIGH (ref 44–121)
BUN/Creatinine Ratio: 16 (ref 12–28)
BUN: 11 mg/dL (ref 8–27)
Bilirubin Total: 0.3 mg/dL (ref 0.0–1.2)
CO2: 22 mmol/L (ref 20–29)
Calcium: 9.2 mg/dL (ref 8.7–10.3)
Chloride: 105 mmol/L (ref 96–106)
Creatinine, Ser: 0.7 mg/dL (ref 0.57–1.00)
Globulin, Total: 2.2 g/dL (ref 1.5–4.5)
Glucose: 92 mg/dL (ref 70–99)
Potassium: 4.6 mmol/L (ref 3.5–5.2)
Sodium: 141 mmol/L (ref 134–144)
Total Protein: 6.3 g/dL (ref 6.0–8.5)
eGFR: 94 mL/min/{1.73_m2} (ref >59)

## 2022-10-01 LAB — LIPID PANEL
Chol/HDL Ratio: 3.7 ratio (ref 0.0–4.4)
Cholesterol, Total: 173 mg/dL (ref 100–199)
HDL: 47 mg/dL (ref >39)
LDL Chol Calc (NIH): 99 mg/dL (ref 0–99)
Triglycerides: 154 mg/dL — ABNORMAL HIGH (ref 0–149)
VLDL Cholesterol Cal: 27 mg/dL (ref 5–40)

## 2022-10-03 ENCOUNTER — Telehealth: Payer: Self-pay | Admitting: Pharmacist

## 2022-10-03 DIAGNOSIS — E782 Mixed hyperlipidemia: Secondary | ICD-10-CM

## 2022-10-03 MED ORDER — REPATHA SURECLICK 140 MG/ML ~~LOC~~ SOAJ: 150.0000 mg | SUBCUTANEOUS | 11 refills | Status: DC

## 2022-10-03 NOTE — Telephone Encounter (Signed)
Insurance is requesting we switch from praluent to repatha Rx called in  Will likely have PA Will renew healthwell grant

## 2022-10-09 ENCOUNTER — Telehealth: Payer: Self-pay | Admitting: Pharmacist

## 2022-10-09 NOTE — Telephone Encounter (Signed)
  Denied--prefers praluent until jan 2024

## 2022-10-09 NOTE — Telephone Encounter (Signed)
Insurance is still preferring Praluent PA still pending, but will likely be denied until next year Will continue to provide Repatha samples through new year [employer] grant renewed Repatha will likely be preferred next year.  Will re-attempt PA at that time Relayed info to daughter & PCP

## 2022-10-10 ENCOUNTER — Telehealth: Payer: Self-pay

## 2022-10-10 DIAGNOSIS — L905 Scar conditions and fibrosis of skin: Secondary | ICD-10-CM | POA: Diagnosis not present

## 2022-10-10 DIAGNOSIS — L304 Erythema intertrigo: Secondary | ICD-10-CM | POA: Diagnosis not present

## 2022-10-10 DIAGNOSIS — D225 Melanocytic nevi of trunk: Secondary | ICD-10-CM | POA: Diagnosis not present

## 2022-10-10 DIAGNOSIS — L821 Other seborrheic keratosis: Secondary | ICD-10-CM | POA: Diagnosis not present

## 2022-10-10 DIAGNOSIS — Z08 Encounter for follow-up examination after completed treatment for malignant neoplasm: Secondary | ICD-10-CM | POA: Diagnosis not present

## 2022-10-10 DIAGNOSIS — Z86007 Personal history of in-situ neoplasm of skin: Secondary | ICD-10-CM | POA: Diagnosis not present

## 2022-10-10 DIAGNOSIS — L814 Other melanin hyperpigmentation: Secondary | ICD-10-CM | POA: Diagnosis not present

## 2022-10-10 DIAGNOSIS — Z85828 Personal history of other malignant neoplasm of skin: Secondary | ICD-10-CM | POA: Diagnosis not present

## 2022-10-10 MED ORDER — AZITHROMYCIN 250 MG PO TABS: ORAL_TABLET | ORAL | 0 refills | Status: DC

## 2022-10-10 NOTE — Telephone Encounter (Signed)
Pt made aware. She has no further concerns.

## 2022-10-10 NOTE — Telephone Encounter (Signed)
Patient states that she is still having trouble with her sinuses and is requesting a ZPak. Please review and advise

## 2022-10-10 NOTE — Telephone Encounter (Signed)
Z pak sent to pharmacy

## 2022-10-16 ENCOUNTER — Telehealth: Payer: Medicare HMO

## 2022-11-01 ENCOUNTER — Telehealth: Payer: Self-pay | Admitting: Pharmacist

## 2022-11-01 DIAGNOSIS — E782 Mixed hyperlipidemia: Secondary | ICD-10-CM

## 2022-11-01 MED ORDER — PRALUENT 150 MG/ML ~~LOC~~ SOAJ: 150.0000 mg | SUBCUTANEOUS | 2 refills | Status: DC

## 2022-11-01 NOTE — Telephone Encounter (Signed)
Praluent called in for [place] card given to daughter  Switch to repatha in January per formulary

## 2022-11-06 ENCOUNTER — Ambulatory Visit (INDEPENDENT_AMBULATORY_CARE_PROVIDER_SITE_OTHER): Payer: Medicare HMO | Admitting: Nurse Practitioner

## 2022-11-06 ENCOUNTER — Encounter: Payer: Self-pay | Admitting: Nurse Practitioner

## 2022-11-06 VITALS — BP 133/67 | HR 91 | Temp 97.7°F | Resp 20 | Ht 65.0 in | Wt 185.0 lb

## 2022-11-06 DIAGNOSIS — Z20828 Contact with and (suspected) exposure to other viral communicable diseases: Secondary | ICD-10-CM

## 2022-11-06 DIAGNOSIS — R051 Acute cough: Secondary | ICD-10-CM

## 2022-11-06 DIAGNOSIS — J029 Acute pharyngitis, unspecified: Secondary | ICD-10-CM

## 2022-11-06 DIAGNOSIS — Z20818 Contact with and (suspected) exposure to other bacterial communicable diseases: Secondary | ICD-10-CM

## 2022-11-06 DIAGNOSIS — H938X3 Other specified disorders of ear, bilateral: Secondary | ICD-10-CM | POA: Diagnosis not present

## 2022-11-06 LAB — VERITOR FLU A/B WAIVED
Influenza A: NEGATIVE
Influenza B: NEGATIVE

## 2022-11-06 LAB — CULTURE, GROUP A STREP

## 2022-11-06 LAB — RAPID STREP SCREEN (MED CTR MEBANE ONLY): Strep Gp A Ag, IA W/Reflex: NEGATIVE

## 2022-11-06 MED ORDER — AMOXICILLIN-POT CLAVULANATE 875-125 MG PO TABS: 1.0000 | ORAL_TABLET | Freq: Two times a day (BID) | ORAL | 0 refills | Status: DC

## 2022-11-06 MED ORDER — OSELTAMIVIR PHOSPHATE 75 MG PO CAPS: 75.0000 mg | ORAL_CAPSULE | Freq: Two times a day (BID) | ORAL | 0 refills | Status: DC

## 2022-11-06 NOTE — Patient Instructions (Signed)
1. Take meds as prescribed 2. Use a cool mist humidifier especially during the winter months and when heat has been humid. 3. Use saline nose sprays frequently 4. Saline irrigations of the nose can be very helpful if done frequently.  * 4X daily for 1 week*  * Use of a nettie pot can be helpful with this. Follow directions with this* 5. Drink plenty of fluids 6. Keep thermostat turn down low 7.For any cough or congestion- delsym OTC 8. For fever or aces or pains- take tylenol or ibuprofen appropriate for age and weight.  * for fevers greater than 101 orally you may alternate ibuprofen and tylenol every  3 hours.

## 2022-11-06 NOTE — Progress Notes (Signed)
Subjective:    Patient ID: [name], female    DOB: [birth year], 71 y.o.   MRN: [id number]   Chief Complaint: Nasal Congestion, Sore Throat, and ears hurting   URI  This is a new problem. The current episode started yesterday. The problem has been gradually worsening. There has been no fever. Associated symptoms include congestion, coughing, rhinorrhea and sinus pain. Pertinent negatives include no abdominal pain, chest pain, headaches or rash. She has tried acetaminophen for the symptoms. The treatment provided mild relief.   Has been around sick grandchildren.    Review of Systems  Constitutional:  Negative for diaphoresis.  HENT:  Positive for congestion, rhinorrhea and sinus pain.   Eyes:  Negative for pain.  Respiratory:  Positive for cough. Negative for shortness of breath.   Cardiovascular:  Negative for chest pain, palpitations and leg swelling.  Gastrointestinal:  Negative for abdominal pain.  Endocrine: Negative for polydipsia.  Skin:  Negative for rash.  Neurological:  Negative for dizziness, weakness and headaches.  Hematological:  Does not bruise/bleed easily.  All other systems reviewed and are negative.      Objective:   Physical Exam Vitals reviewed.  Constitutional:      Appearance: Normal appearance.  Cardiovascular:     Rate and Rhythm: Normal rate and regular rhythm.     Heart sounds: Normal heart sounds.  Pulmonary:     Breath sounds: Normal breath sounds.  Skin:    General: Skin is warm.  Neurological:     General: No focal deficit present.     Mental Status: She is alert and oriented to person, place, and time.     Coordination: Coordination abnormal.  Psychiatric:        Mood and Affect: Mood normal.        Behavior: Behavior normal.    BP 133/67   Pulse 91   Temp 97.7 F (36.5 C) (Temporal)   Resp 20   Ht '5\' 5"'$  (1.651 m)   Wt 185 lb (83.9 kg)   SpO2 98%   BMI 30.79 kg/m   Strep negative Flu negative      Assessment &  Plan:   [name] in today with chief complaint of Nasal Congestion, Sore Throat, and ears hurting   1. Acute cough  2. Congestion of both ears  -Veritor Flu A/B Waived  3. Sore throat - Rapid Strep Screen ([facility] ONLY)  4. Exposure to the flu  5. Strep throat exposure 1. Take meds as prescribed 2. Use a cool mist humidifier especially during the winter months and when heat has been humid. 3. Use saline nose sprays frequently 4. Saline irrigations of the nose can be very helpful if done frequently.  * 4X daily for 1 week*  * Use of a nettie pot can be helpful with this. Follow directions with this* 5. Drink plenty of fluids 6. Keep thermostat turn down low 7.For any cough or congestion- delsym OTC 8. For fever or aces or pains- take tylenol or ibuprofen appropriate for age and weight.  * for fevers greater than 101 orally you may alternate ibuprofen and tylenol every  3 hours.       The above assessment and management plan was discussed with the patient. The patient verbalized understanding of and has agreed to the management plan. Patient is aware to call the clinic if symptoms persist or worsen. Patient is aware when to return to the clinic for a follow-up visit.  Patient educated on when it is appropriate to go to the emergency department.   Mary-Margaret Hassell Done, FNP

## 2022-11-11 ENCOUNTER — Other Ambulatory Visit: Payer: Self-pay | Admitting: Nurse Practitioner

## 2022-11-11 MED ORDER — PREDNISONE 20 MG PO TABS: 40.0000 mg | ORAL_TABLET | Freq: Every day | ORAL | 0 refills | Status: AC

## 2022-11-11 MED ORDER — AZITHROMYCIN 250 MG PO TABS: ORAL_TABLET | ORAL | 0 refills | Status: DC

## 2022-11-26 ENCOUNTER — Ambulatory Visit (INDEPENDENT_AMBULATORY_CARE_PROVIDER_SITE_OTHER): Payer: Medicare HMO | Admitting: Pharmacist

## 2022-11-26 DIAGNOSIS — E8881 Metabolic syndrome: Secondary | ICD-10-CM

## 2022-11-26 DIAGNOSIS — E782 Mixed hyperlipidemia: Secondary | ICD-10-CM

## 2022-11-26 NOTE — Progress Notes (Signed)
Chronic Care Management Pharmacy Note  11/26/2022 Name:  [name] MRN:  [id number] DOB:  [birth year]  Summary:  Hyperlipidemia:  Goal on Track (progressing): YES. controlled; current treatment: Praluent to [place] per insurance LDL goal<100 -- patient now at goal! Medications previously tried: ATORVASTATIN, ROSUVASTATIN  Current dietary patterns: recommend heart healthy diet Educated on repatha  Recommended repatha '140mg'$  every 14 days based on compliance and insurance Assessed patient finances. Re-Enrolled in the [employer] grant for copay assistance; patient has copay card Lipid Panel     Component Value Date/Time   CHOL 173 09/30/2022 0931   TRIG 154 (H) 09/30/2022 0931   HDL 47 09/30/2022 0931   CHOLHDL 3.7 09/30/2022 0931   LDLCALC 99 09/30/2022 0931   LABVLDL 27 09/30/2022 0931     Patient Goals/Self-Care Activities patient will:  - take medications as prescribed as evidenced by patient report and record review collaborate with provider on medication access solutions target a minimum of 150 minutes of moderate intensity exercise weekly engage in dietary modifications by HEART HEALTHY DIET   Subjective: [name] is an 72 y.o. year old female who is a primary patient of Chevis Pretty, [place].  The patient was referred to the Chronic Care Management team for assistance with care management needs subsequent to provider initiation of CCM services and plan of care.    Engaged with patient by telephone for follow up visit in response to provider referral for CCM services.   Objective:  LABS:    Lab Results  Component Value Date   CREATININE 0.70 09/30/2022   CREATININE 0.99 04/23/2022   CREATININE 0.72 10/24/2021     Lab Results  Component Value Date   HGBA1C 5.2 10/24/2021         Component Value Date/Time   CHOL 173 09/30/2022 0931   TRIG 154 (H) 09/30/2022 0931   HDL 47 09/30/2022 0931   CHOLHDL 3.7 09/30/2022 0931   LDLCALC 99 09/30/2022  0931     Clinical ASCVD: No   The 10-year ASCVD risk score (Arnett DK, et al., 2019) is: 9%   Values used to calculate the score:     Age: 72 years     Sex: Female     Is Non-Hispanic African American: No     Diabetic: No     Tobacco smoker: No     Systolic Blood Pressure: 235 mmHg     Is BP treated: No     HDL Cholesterol: 47 mg/dL     Total Cholesterol: 173 mg/dL    Other: (CHADS2VASc if Afib, PHQ9 if depression, MMRC or CAT for COPD, ACT, DEXA)    BP Readings from Last 3 Encounters:  11/06/22 133/67  09/30/22 121/78  07/22/22 139/70      SDOH:  (Social Determinants of Health) assessments and interventions performed:    Allergies  Allergen Reactions   Bee Venom Swelling   Rosuvastatin Other (See Comments)    myopathy   Lipitor [Atorvastatin] Other (See Comments)    myopathy    Medications Reviewed Today     Reviewed by Lavera Guise, [facility] (Pharmacist) on 11/26/22 at 1024  Med List Status: <None>   Medication Order Taking? Sig Documenting Provider Last Dose Status Informant  Discontinued 11/26/22 1024 (Change in therapy)   ALPRAZolam (XANAX) 0.5 MG tablet [id number] No Take 1 tablet (0.5 mg total) by mouth 2 (two) times daily as needed for anxiety. Hassell Done, Mary-Margaret, FNP Taking Active   escitalopram (LEXAPRO) 20 MG  tablet [id number] No Take 1 tablet (20 mg total) by mouth daily. Hassell Done Mary-Margaret, FNP Taking Active   Evolocumab [facility] SURECLICK) 038 MG/ML Darden Palmer [id number] No Inject 150 mg into the skin every 14 (fourteen) days. Hassell Done, Mary-Margaret, FNP Taking Active   pantoprazole (PROTONIX) 20 MG tablet [id number] No Take 1 tablet (20 mg total) by mouth daily. Chevis Pretty, FNP Taking Active               Goals Addressed   None     Plan: No further follow up required: encouraged patient to reach out if needed     Regina Eck, PharmD, BCPS, [place] Clinical Pharmacist, [place]  [facility]  T  [phone]

## 2022-12-06 ENCOUNTER — Telehealth: Payer: Self-pay | Admitting: Nurse Practitioner

## 2022-12-06 NOTE — Telephone Encounter (Signed)
Left message for patient to call back and schedule Medicare Annual Wellness Visit (AWV) to be completed by video or phone.   Last AWV: 10/01/2021   Please schedule at anytime with [place]     Any questions, please contact me at [phone]   Thank you,   [facility] Ambulatory Clinical Support for [facility] ??[phone] or ??[phone]

## 2022-12-09 ENCOUNTER — Ambulatory Visit (INDEPENDENT_AMBULATORY_CARE_PROVIDER_SITE_OTHER): Payer: Medicare HMO | Admitting: Family

## 2022-12-09 ENCOUNTER — Encounter: Payer: Self-pay | Admitting: Family

## 2022-12-09 ENCOUNTER — Ambulatory Visit (INDEPENDENT_AMBULATORY_CARE_PROVIDER_SITE_OTHER): Payer: Medicare HMO

## 2022-12-09 VITALS — BP 138/79 | HR 78 | Temp 97.9°F | Resp 20 | Ht 65.0 in | Wt 183.0 lb

## 2022-12-09 DIAGNOSIS — J208 Acute bronchitis due to other specified organisms: Secondary | ICD-10-CM | POA: Diagnosis not present

## 2022-12-09 DIAGNOSIS — B9689 Other specified bacterial agents as the cause of diseases classified elsewhere: Secondary | ICD-10-CM | POA: Diagnosis not present

## 2022-12-09 DIAGNOSIS — R051 Acute cough: Secondary | ICD-10-CM

## 2022-12-09 DIAGNOSIS — R059 Cough, unspecified: Secondary | ICD-10-CM | POA: Diagnosis not present

## 2022-12-09 MED ORDER — PREDNISONE 10 MG (21) PO TBPK: ORAL_TABLET | ORAL | 0 refills | Status: DC

## 2022-12-09 MED ORDER — DOXYCYCLINE HYCLATE 100 MG PO TABS: 100.0000 mg | ORAL_TABLET | Freq: Two times a day (BID) | ORAL | 0 refills | Status: DC

## 2022-12-09 NOTE — Progress Notes (Signed)
Subjective:    Patient ID: [name], female    DOB: 04-Aug-1951, 72 y.o.   MRN: [id number]  Chief Complaint  Patient presents with   Cough    Since 11/18    Nasal Congestion    Soreness around ribs   PT presents to the office today with a recurrent cough since 11/18. She had a zpak in 10/10/22 and Augmentin and prednisone 11/06/22.  Cough This is a new problem. The current episode started more than 1 month ago. The problem has been waxing and waning. The problem occurs every few minutes. The cough is Productive of sputum. Associated symptoms include ear congestion, ear pain (right), headaches, myalgias, nasal congestion, postnasal drip, a sore throat and shortness of breath. Pertinent negatives include no chills, fever or wheezing. She has tried rest for the symptoms. The treatment provided mild relief.      Review of Systems  Constitutional:  Negative for chills and fever.  HENT:  Positive for ear pain (right), postnasal drip and sore throat.   Respiratory:  Positive for cough and shortness of breath. Negative for wheezing.   Musculoskeletal:  Positive for myalgias.  Neurological:  Positive for headaches.  All other systems reviewed and are negative.      Objective:   Physical Exam Vitals reviewed.  Constitutional:      General: She is not in acute distress.    Appearance: She is well-developed.  HENT:     Head: Normocephalic and atraumatic.     Right Ear: Tympanic membrane normal.     Left Ear: Tympanic membrane normal.     Mouth/Throat:     Pharynx: Posterior oropharyngeal erythema present.  Eyes:     Pupils: Pupils are equal, round, and reactive to light.  Neck:     Thyroid: No thyromegaly.  Cardiovascular:     Rate and Rhythm: Normal rate and regular rhythm.     Heart sounds: Normal heart sounds. No murmur heard. Pulmonary:     Effort: Pulmonary effort is normal. No respiratory distress.     Breath sounds: Normal breath sounds. No wheezing.  Abdominal:      General: Bowel sounds are normal. There is no distension.     Palpations: Abdomen is soft.     Tenderness: There is no abdominal tenderness.  Musculoskeletal:        General: No tenderness. Normal range of motion.     Cervical back: Normal range of motion and neck supple.  Skin:    General: Skin is warm and dry.  Neurological:     Mental Status: She is alert and oriented to person, place, and time.     Cranial Nerves: No cranial nerve deficit.     Deep Tendon Reflexes: Reflexes are normal and symmetric.  Psychiatric:        Behavior: Behavior normal.        Thought Content: Thought content normal.        Judgment: Judgment normal.      BP 138/79   Pulse 78   Temp 97.9 F (36.6 C) (Temporal)   Resp 20   Ht '5\' 5"'$  (1.651 m)   Wt 183 lb (83 kg)   SpO2 97%   BMI 30.45 kg/m       Assessment & Plan:  TANYAH DEBRUYNE comes in today with chief complaint of Cough (Since 11/18 ) and Nasal Congestion (Soreness around ribs)   Diagnosis and orders addressed:  1. Acute cough - doxycycline (VIBRA-TABS) 100  MG tablet; Take 1 tablet (100 mg total) by mouth 2 (two) times daily.  Dispense: 20 tablet; Refill: 0 - DG Chest 2 View - predniSONE (STERAPRED UNI-PAK 21 TAB) 10 MG (21) TBPK tablet; Use as directed  Dispense: 21 tablet; Refill: 0  2. Acute bacterial bronchitis - Take meds as prescribed - Use a cool mist humidifier  -Use saline nose sprays frequently -Force fluids -For any cough or congestion  Use plain Mucinex- regular strength or max strength is fine -For fever or aces or pains- take tylenol or ibuprofen. -Throat lozenges if help -Follow up if symptoms worsen or do not improve  - doxycycline (VIBRA-TABS) 100 MG tablet; Take 1 tablet (100 mg total) by mouth 2 (two) times daily.  Dispense: 20 tablet; Refill: 0 - predniSONE (STERAPRED UNI-PAK 21 TAB) 10 MG (21) TBPK tablet; Use as directed  Dispense: 21 tablet; Refill: 0     Evelina Dun, FNP

## 2022-12-09 NOTE — Patient Instructions (Signed)

## 2022-12-10 ENCOUNTER — Telehealth: Payer: Self-pay

## 2022-12-10 NOTE — Telephone Encounter (Signed)
Last xray report stated aorta calcified. On previous xray there was no mention. Do you think she needs aortic doppler studies? Please review and advise Patient is concerned

## 2022-12-10 NOTE — Telephone Encounter (Signed)
No just needs to be on daily aspirin

## 2022-12-10 NOTE — Telephone Encounter (Signed)
Patient notified and verbalized understanding. 

## 2022-12-12 ENCOUNTER — Encounter: Payer: Self-pay | Admitting: Nurse Practitioner

## 2022-12-12 ENCOUNTER — Ambulatory Visit (INDEPENDENT_AMBULATORY_CARE_PROVIDER_SITE_OTHER): Payer: Medicare HMO | Admitting: Nurse Practitioner

## 2022-12-12 VITALS — BP 136/86 | HR 79 | Temp 97.4°F | Resp 20 | Ht 65.0 in | Wt 183.0 lb

## 2022-12-12 DIAGNOSIS — R635 Abnormal weight gain: Secondary | ICD-10-CM

## 2022-12-12 DIAGNOSIS — Z6830 Body mass index (BMI) 30.0-30.9, adult: Secondary | ICD-10-CM

## 2022-12-12 MED ORDER — ZEPBOUND 2.5 MG/0.5ML ~~LOC~~ SOAJ: 2.5000 mg | SUBCUTANEOUS | 2 refills | Status: DC

## 2022-12-12 NOTE — Patient Instructions (Signed)
Exercising to Stay Healthy ?To become healthy and stay healthy, it is recommended that you do moderate-intensity and vigorous-intensity exercise. You can tell that you are exercising at a moderate intensity if your heart starts beating faster and you start breathing faster but can still hold a conversation. You can tell that you are exercising at a vigorous intensity if you are breathing much harder and faster and cannot hold a conversation while exercising. ?How can exercise benefit me? ?Exercising regularly is important. It has many health benefits, such as: ?Improving overall fitness, flexibility, and endurance. ?Increasing bone density. ?Helping with weight control. ?Decreasing body fat. ?Increasing muscle strength and endurance. ?Reducing stress and tension, anxiety, depression, or anger. ?Improving overall health. ?What guidelines should I follow while exercising? ?Before you start a new exercise program, talk with your health care provider. ?Do not exercise so much that you hurt yourself, feel dizzy, or get very short of breath. ?Wear comfortable clothes and wear shoes with good support. ?Drink plenty of water while you exercise to prevent dehydration or heat stroke. ?Work out until your breathing and your heartbeat get faster (moderate intensity). ?How often should I exercise? ?Choose an activity that you enjoy, and set realistic goals. Your health care provider can help you make an activity plan that is individually designed and works best for you. ?Exercise regularly as told by your health care provider. This may include: ?Doing strength training two times a week, such as: ?Lifting weights. ?Using resistance bands. ?Push-ups. ?Sit-ups. ?Yoga. ?Doing a certain intensity of exercise for a given amount of time. Choose from these options: ?A total of 150 minutes of moderate-intensity exercise every week. ?A total of 75 minutes of vigorous-intensity exercise every week. ?A mix of moderate-intensity and  vigorous-intensity exercise every week. ?Children, pregnant women, people who have not exercised regularly, people who are overweight, and older adults may need to talk with a health care provider about what activities are safe to perform. If you have a medical condition, be sure to talk with your health care provider before you start a new exercise program. ?What are some exercise ideas? ?Moderate-intensity exercise ideas include: ?Walking 1 mile (1.6 km) in about 15 minutes. ?Biking. ?Hiking. ?Golfing. ?Dancing. ?Water aerobics. ?Vigorous-intensity exercise ideas include: ?Walking 4.5 miles (7.2 km) or more in about 1 hour. ?Jogging or running 5 miles (8 km) in about 1 hour. ?Biking 10 miles (16.1 km) or more in about 1 hour. ?Lap swimming. ?Roller-skating or in-line skating. ?Cross-country skiing. ?Vigorous competitive sports, such as football, basketball, and soccer. ?Jumping rope. ?Aerobic dancing. ?What are some everyday activities that can help me get exercise? ?Yard work, such as: ?Pushing a lawn mower. ?Raking and bagging leaves. ?Washing your car. ?Pushing a stroller. ?Shoveling snow. ?Gardening. ?Washing windows or floors. ?How can I be more active in my day-to-day activities? ?Use stairs instead of an elevator. ?Take a walk during your lunch break. ?If you drive, park your car farther away from your work or school. ?If you take public transportation, get off one stop early and walk the rest of the way. ?Stand up or walk around during all of your indoor phone calls. ?Get up, stretch, and walk around every 30 minutes throughout the day. ?Enjoy exercise with a friend. Support to continue exercising will help you keep a regular routine of activity. ?Where to find more information ?You can find more information about exercising to stay healthy from: ?U.S. Department of Health and Human Services: www.hhs.gov ?Centers for Disease Control and Prevention (  CDC): www.cdc.gov ?Summary ?Exercising regularly is  important. It will improve your overall fitness, flexibility, and endurance. ?Regular exercise will also improve your overall health. It can help you control your weight, reduce stress, and improve your bone density. ?Do not exercise so much that you hurt yourself, feel dizzy, or get very short of breath. ?Before you start a new exercise program, talk with your health care provider. ?This information is not intended to replace advice given to you by your health care provider. Make sure you discuss any questions you have with your health care provider. ?Document Revised: 03/09/2021 Document Reviewed: 03/09/2021 ?Elsevier Patient Education ? 2023 Elsevier Inc. ? ?

## 2022-12-12 NOTE — Progress Notes (Signed)
Subjective:    Patient ID: [name], female    DOB: 09/13/1951, 72 y.o.   MRN: [id number]   Chief Complaint: Discuss weight   HPI Patient come sin t discuss her weight. She is a stress eater and eats all the time. Her weight has gone up. She was on ozempic over a year ago and did well. Would like to try again.  Patient's BMI is >30 mg/m2.  Patient's current BMI is Body mass index is 30.45 kg/m.Marland Kitchen  Patient is currently enrolled in a healthy eating plan along with encouraged exercise.  Patient has contraindications to phentermine, Contrave & Qsymia (contains phentermine).  Patient does not have a personal or family history of medullary thyroid carcinoma (MTC) or Multiple Endocrine Neoplasia syndrome type 2 (MEN 2).   Wt Readings from Last 3 Encounters:  12/12/22 183 lb (83 kg)  12/09/22 183 lb (83 kg)  11/06/22 185 lb (83.9 kg)   BMI Readings from Last 3 Encounters:  12/12/22 30.45 kg/m  12/09/22 30.45 kg/m  11/06/22 30.79 kg/m       Review of Systems  Constitutional:  Negative for diaphoresis.  Eyes:  Negative for pain.  Respiratory:  Negative for shortness of breath.   Cardiovascular:  Negative for chest pain, palpitations and leg swelling.  Gastrointestinal:  Negative for abdominal pain.  Endocrine: Negative for polydipsia.  Skin:  Negative for rash.  Neurological:  Negative for dizziness, weakness and headaches.  Hematological:  Does not bruise/bleed easily.  All other systems reviewed and are negative.      Objective:   Physical Exam Vitals and nursing note reviewed.  Constitutional:      General: She is not in acute distress.    Appearance: Normal appearance. She is well-developed.  Neck:     Vascular: No carotid bruit or JVD.  Cardiovascular:     Rate and Rhythm: Normal rate and regular rhythm.     Heart sounds: Normal heart sounds.  Pulmonary:     Effort: Pulmonary effort is normal. No respiratory distress.     Breath sounds: Normal breath  sounds. No wheezing or rales.  Chest:     Chest wall: No tenderness.  Abdominal:     General: Bowel sounds are normal. There is no distension or abdominal bruit.     Palpations: Abdomen is soft. There is no hepatomegaly, splenomegaly, mass or pulsatile mass.     Tenderness: There is no abdominal tenderness.  Musculoskeletal:        General: Normal range of motion.     Cervical back: Normal range of motion and neck supple.  Lymphadenopathy:     Cervical: No cervical adenopathy.  Skin:    General: Skin is warm and dry.  Neurological:     Mental Status: She is alert and oriented to person, place, and time.     Deep Tendon Reflexes: Reflexes are normal and symmetric.  Psychiatric:        Behavior: Behavior normal.        Thought Content: Thought content normal.        Judgment: Judgment normal.    BP 136/86   Pulse 79   Temp (!) 97.4 F (36.3 C) (Temporal)   Resp 20   Ht '5\' 5"'$  (1.651 m)   Wt 183 lb (83 kg)   SpO2 98%   BMI 30.45 kg/m         Assessment & Plan:   [name] in today with chief complaint of Discuss  weight   1. BMI 30.0-30.9,adult Low fat diet Exercise encouraged RTO in 1 month after starting zepbound - tirzepatide (ZEPBOUND) 2.5 MG/0.5ML Pen; Inject 2.5 mg into the skin once a week.  Dispense: 2 mL; Refill: 2    The above assessment and management plan was discussed with the patient. The patient verbalized understanding of and has agreed to the management plan. Patient is aware to call the clinic if symptoms persist or worsen. Patient is aware when to return to the clinic for a follow-up visit. Patient educated on when it is appropriate to go to the emergency department.   Mary-Margaret Hassell Done, FNP

## 2022-12-13 ENCOUNTER — Telehealth: Payer: Self-pay | Admitting: Pharmacist

## 2022-12-13 NOTE — Telephone Encounter (Signed)
PA for zepbound denied under [place] medicare card If she has a copy on her [employer] new card, I can try to run that (state health insurance card) [name] will not pay for it

## 2022-12-13 NOTE — Telephone Encounter (Signed)
Patient notified

## 2022-12-25 DIAGNOSIS — E782 Mixed hyperlipidemia: Secondary | ICD-10-CM

## 2022-12-30 ENCOUNTER — Encounter: Payer: Self-pay | Admitting: Nurse Practitioner

## 2022-12-30 ENCOUNTER — Ambulatory Visit (INDEPENDENT_AMBULATORY_CARE_PROVIDER_SITE_OTHER): Payer: Medicare HMO | Admitting: Nurse Practitioner

## 2022-12-30 VITALS — BP 128/76 | HR 98 | Temp 97.4°F | Resp 20 | Ht 65.0 in | Wt 183.0 lb

## 2022-12-30 DIAGNOSIS — H8393 Unspecified disease of inner ear, bilateral: Secondary | ICD-10-CM | POA: Diagnosis not present

## 2022-12-30 DIAGNOSIS — R5081 Fever presenting with conditions classified elsewhere: Secondary | ICD-10-CM | POA: Diagnosis not present

## 2022-12-30 DIAGNOSIS — K5901 Slow transit constipation: Secondary | ICD-10-CM | POA: Diagnosis not present

## 2022-12-30 DIAGNOSIS — R509 Fever, unspecified: Secondary | ICD-10-CM | POA: Diagnosis not present

## 2022-12-30 MED ORDER — MECLIZINE HCL 25 MG PO TABS: 25.0000 mg | ORAL_TABLET | Freq: Three times a day (TID) | ORAL | 0 refills | Status: DC | PRN

## 2022-12-30 MED ORDER — PREDNISONE 20 MG PO TABS: 40.0000 mg | ORAL_TABLET | Freq: Every day | ORAL | 0 refills | Status: AC

## 2022-12-30 NOTE — Patient Instructions (Signed)
Benign Positional Vertigo Vertigo is the feeling that you or your surroundings are moving when they are not. Benign positional vertigo is the most common form of vertigo. This is usually a harmless condition (benign). This condition is positional. This means that symptoms are triggered by certain movements and positions. This condition can be dangerous if it occurs while you are doing something that could cause harm to yourself or others. This includes activities such as driving or operating machinery. What are the causes? The inner ear has fluid-filled canals that help your brain sense movement and balance. When the fluid moves, the brain receives messages about your body's position. With benign positional vertigo, calcium crystals in the inner ear break free and disturb the inner ear area. This causes your brain to receive confusing messages about your body's position. What increases the risk? You are more likely to develop this condition if: You are a woman. You are 70 years of age or older. You have recently had a head injury. You have an inner ear disease. What are the signs or symptoms? Symptoms of this condition usually happen when you move your head or your eyes in different directions. Symptoms may start suddenly and usually last for less than a minute. They include: Loss of balance and falling. Feeling like you are spinning or moving. Feeling like your surroundings are spinning or moving. Nausea and vomiting. Blurred vision. Dizziness. Involuntary eye movement (nystagmus). Symptoms can be mild and cause only minor problems, or they can be severe and interfere with daily life. Episodes of benign positional vertigo may return (recur) over time. Symptoms may also improve over time. How is this diagnosed? This condition may be diagnosed based on: Your medical history. A physical exam of the head, neck, and ears. Positional tests to check for or stimulate vertigo. You may be asked to  turn your head and change positions, such as going from sitting to lying down. A health care provider will watch for symptoms of vertigo. You may be referred to a health care provider who specializes in ear, nose, and throat problems (ENT or otolaryngologist) or a provider who specializes in disorders of the nervous system (neurologist). How is this treated?  This condition may be treated in a session in which your health care provider moves your head in specific positions to help the displaced crystals in your inner ear move. Treatment for this condition may take several sessions. Surgery may be needed in severe cases, but this is rare. In some cases, benign positional vertigo may resolve on its own in 2-4 weeks. Follow these instructions at home: Safety Move slowly. Avoid sudden body or head movements or certain positions, as told by your health care provider. Avoid driving or operating machinery until your health care provider says it is safe. Avoid doing any tasks that would be dangerous to you or others if vertigo occurs. If you have trouble walking or keeping your balance, try using a cane for stability. If you feel dizzy or unstable, sit down right away. Return to your normal activities as told by your health care provider. Ask your health care provider what activities are safe for you. General instructions Take over-the-counter and prescription medicines only as told by your health care provider. Drink enough fluid to keep your urine pale yellow. Keep all follow-up visits. This is important. Contact a health care provider if: You have a fever. Your condition gets worse or you develop new symptoms. Your family or friends notice any behavioral changes.   You have nausea or vomiting that gets worse. You have numbness or a prickling and tingling sensation. Get help right away if you: Have difficulty speaking or moving. Are always dizzy or faint. Develop severe headaches. Have weakness in  your legs or arms. Have changes in your hearing or vision. Develop a stiff neck. Develop sensitivity to light. These symptoms may represent a serious problem that is an emergency. Do not wait to see if the symptoms will go away. Get medical help right away. Call your local emergency services (911 in the [place] Do not drive yourself to the hospital. Summary Vertigo is the feeling that you or your surroundings are moving when they are not. Benign positional vertigo is the most common form of vertigo. This condition is caused by calcium crystals in the inner ear that become displaced. This causes a disturbance in an area of the inner ear that helps your brain sense movement and balance. Symptoms include loss of balance and falling, feeling that you or your surroundings are moving, nausea and vomiting, and blurred vision. This condition can be diagnosed based on symptoms, a physical exam, and positional tests. Follow safety instructions as told by your health care provider and keep all follow-up visits. This is important. This information is not intended to replace advice given to you by your health care provider. Make sure you discuss any questions you have with your health care provider. Document Revised: 10/11/2020 Document Reviewed: 10/11/2020 Elsevier Patient Education  2023 [employer]  

## 2022-12-30 NOTE — Progress Notes (Signed)
   Subjective:    Patient ID: [name], female    DOB: 09-04-1951, 72 y.o.   MRN: [id number]   Chief Complaint: fever at night time (2 weeks/) and Headache (Can hear things when see moves her eyes)   Headache  Associated symptoms include dizziness, a fever and nausea.   Patient has been sick off and on for several months. She says she has had a fever only at night for the last 2 weeks. She has had intermittent headache. She says when she moves her eyes she "can hear it in her ears". Has occasional dizzy spells. Has nausea with constipation.     Review of Systems  Constitutional:  Positive for chills, fatigue and fever.  HENT:  Positive for congestion.   Respiratory:  Negative for shortness of breath.   Gastrointestinal:  Positive for constipation and nausea.  Neurological:  Positive for dizziness and headaches.       Objective:   Physical Exam Vitals reviewed.  Constitutional:      Appearance: Normal appearance.  HENT:     Nose: Congestion present.  Eyes:     Pupils: Pupils are equal, round, and reactive to light.  Cardiovascular:     Rate and Rhythm: Normal rate and regular rhythm.     Heart sounds: Normal heart sounds.  Pulmonary:     Effort: Pulmonary effort is normal.     Breath sounds: Normal breath sounds.  Skin:    General: Skin is warm.  Neurological:     General: No focal deficit present.     Mental Status: She is alert and oriented to person, place, and time.  Psychiatric:        Mood and Affect: Mood normal.        Behavior: Behavior normal.    BP 128/76   Pulse 98   Temp (!) 97.4 F (36.3 C) (Temporal)   Resp 20   Ht '5\' 5"'$  (1.651 m)   Wt 183 lb (83 kg)   SpO2 95%   BMI 30.45 kg/m         Assessment & Plan:   Cornie Mccomber Lipsky in today with chief complaint of fever at night time (2 weeks/) and Headache (Can hear things when see moves her eyes)   1. Dysfunction of both inner ears Flonase nasal spray - meclizine (ANTIVERT) 25 MG tablet;  Take 1 tablet (25 mg total) by mouth 3 (three) times daily as needed for dizziness.  Dispense: 30 tablet; Refill: 0 - predniSONE (DELTASONE) 20 MG tablet; Take 2 tablets (40 mg total) by mouth daily with breakfast for 5 days. 2 po daily for 5 days  Dispense: 10 tablet; Refill: 0  2. Slow transit constipation miralax  3. Fever in other diseases Labs pending Motrin or tylenol as needed - CBC with Differential/Platelet - BMP8+EGFR    The above assessment and management plan was discussed with the patient. The patient verbalized understanding of and has agreed to the management plan. Patient is aware to call the clinic if symptoms persist or worsen. Patient is aware when to return to the clinic for a follow-up visit. Patient educated on when it is appropriate to go to the emergency department.   Mary-Margaret Hassell Done, FNP

## 2022-12-31 LAB — BMP8+EGFR
BUN/Creatinine Ratio: 10 — ABNORMAL LOW (ref 12–28)
BUN: 7 mg/dL — ABNORMAL LOW (ref 8–27)
CO2: 22 mmol/L (ref 20–29)
Calcium: 8.8 mg/dL (ref 8.7–10.3)
Chloride: 100 mmol/L (ref 96–106)
Creatinine, Ser: 0.73 mg/dL (ref 0.57–1.00)
Glucose: 94 mg/dL (ref 70–99)
Potassium: 3.9 mmol/L (ref 3.5–5.2)
Sodium: 139 mmol/L (ref 134–144)
eGFR: 89 mL/min/{1.73_m2} (ref >59)

## 2022-12-31 LAB — CBC WITH DIFFERENTIAL/PLATELET
Basophils Absolute: 0 10*3/uL (ref 0.0–0.2)
Basos: 1 %
EOS (ABSOLUTE): 0.1 10*3/uL (ref 0.0–0.4)
Eos: 3 %
Hematocrit: 38.8 % (ref 34.0–46.6)
Hemoglobin: 13.1 g/dL (ref 11.1–15.9)
Immature Grans (Abs): 0 10*3/uL (ref 0.0–0.1)
Immature Granulocytes: 1 %
Lymphocytes Absolute: 1.3 10*3/uL (ref 0.7–3.1)
Lymphs: 31 %
MCH: 29.8 pg (ref 26.6–33.0)
MCHC: 33.8 g/dL (ref 31.5–35.7)
MCV: 88 fL (ref 79–97)
Monocytes Absolute: 0.4 10*3/uL (ref 0.1–0.9)
Monocytes: 10 %
Neutrophils Absolute: 2.4 10*3/uL (ref 1.4–7.0)
Neutrophils: 54 %
Platelets: 163 10*3/uL (ref 150–450)
RBC: 4.4 x10E6/uL (ref 3.77–5.28)
RDW: 13.2 % (ref 11.7–15.4)
WBC: 4.3 10*3/uL (ref 3.4–10.8)

## 2023-01-18 ENCOUNTER — Emergency Department (HOSPITAL_BASED_OUTPATIENT_CLINIC_OR_DEPARTMENT_OTHER): Payer: Medicare HMO

## 2023-01-18 ENCOUNTER — Other Ambulatory Visit: Payer: Self-pay

## 2023-01-18 ENCOUNTER — Emergency Department (HOSPITAL_BASED_OUTPATIENT_CLINIC_OR_DEPARTMENT_OTHER)
Admission: EM | Admit: 2023-01-18 | Discharge: 2023-01-19 | Disposition: A | Discharge status: Home / Self Care | Payer: Medicare HMO | Attending: Emergency Medicine | Admitting: Emergency Medicine

## 2023-01-18 ENCOUNTER — Encounter (HOSPITAL_BASED_OUTPATIENT_CLINIC_OR_DEPARTMENT_OTHER): Payer: Self-pay

## 2023-01-18 DIAGNOSIS — R0602 Shortness of breath: Secondary | ICD-10-CM

## 2023-01-18 DIAGNOSIS — Z1152 Encounter for screening for COVID-19: Secondary | ICD-10-CM | POA: Diagnosis not present

## 2023-01-18 DIAGNOSIS — Z8616 Personal history of COVID-19: Secondary | ICD-10-CM | POA: Insufficient documentation

## 2023-01-18 DIAGNOSIS — J984 Other disorders of lung: Secondary | ICD-10-CM | POA: Diagnosis not present

## 2023-01-18 DIAGNOSIS — I2584 Coronary atherosclerosis due to calcified coronary lesion: Secondary | ICD-10-CM | POA: Diagnosis not present

## 2023-01-18 DIAGNOSIS — J4541 Moderate persistent asthma with (acute) exacerbation: Secondary | ICD-10-CM | POA: Diagnosis not present

## 2023-01-18 LAB — RESP PANEL BY RT-PCR (RSV, FLU A&B, COVID)  RVPGX2
Influenza A by PCR: NEGATIVE
Influenza B by PCR: NEGATIVE
Resp Syncytial Virus by PCR: NEGATIVE
SARS Coronavirus 2 by RT PCR: NEGATIVE

## 2023-01-18 LAB — COMPREHENSIVE METABOLIC PANEL
ALT: 20 U/L (ref 0–44)
AST: 32 U/L (ref 15–41)
Albumin: 4 g/dL (ref 3.5–5.0)
Alkaline Phosphatase: 107 U/L (ref 38–126)
Anion gap: 10 (ref 5–15)
BUN: 12 mg/dL (ref 8–23)
CO2: 24 mmol/L (ref 22–32)
Calcium: 9.2 mg/dL (ref 8.9–10.3)
Chloride: 105 mmol/L (ref 98–111)
Creatinine, Ser: 0.85 mg/dL (ref 0.44–1.00)
GFR, Estimated: >60 mL/min (ref >60)
Glucose, Bld: 105 mg/dL — ABNORMAL HIGH (ref 70–99)
Potassium: 3.3 mmol/L — ABNORMAL LOW (ref 3.5–5.1)
Sodium: 139 mmol/L (ref 135–145)
Total Bilirubin: 0.4 mg/dL (ref 0.3–1.2)
Total Protein: 6.9 g/dL (ref 6.5–8.1)

## 2023-01-18 LAB — CBC WITH DIFFERENTIAL/PLATELET
Abs Immature Granulocytes: 0.01 10*3/uL (ref 0.00–0.07)
Basophils Absolute: 0.1 10*3/uL (ref 0.0–0.1)
Basophils Relative: 1 %
Eosinophils Absolute: 0.2 10*3/uL (ref 0.0–0.5)
Eosinophils Relative: 4 %
HCT: 38.4 % (ref 36.0–46.0)
Hemoglobin: 12.5 g/dL (ref 12.0–15.0)
Immature Granulocytes: 0 %
Lymphocytes Relative: 38 %
Lymphs Abs: 2.4 10*3/uL (ref 0.7–4.0)
MCH: 29.2 pg (ref 26.0–34.0)
MCHC: 32.6 g/dL (ref 30.0–36.0)
MCV: 89.7 fL (ref 80.0–100.0)
Monocytes Absolute: 0.4 10*3/uL (ref 0.1–1.0)
Monocytes Relative: 7 %
Neutro Abs: 3.3 10*3/uL (ref 1.7–7.7)
Neutrophils Relative %: 50 %
Platelets: 189 10*3/uL (ref 150–400)
RBC: 4.28 MIL/uL (ref 3.87–5.11)
RDW: 13.5 % (ref 11.5–15.5)
WBC: 6.4 10*3/uL (ref 4.0–10.5)
nRBC: 0 % (ref 0.0–0.2)

## 2023-01-18 LAB — BRAIN NATRIURETIC PEPTIDE: B Natriuretic Peptide: 29 pg/mL (ref 0.0–100.0)

## 2023-01-18 LAB — TROPONIN I (HIGH SENSITIVITY): Troponin I (High Sensitivity): 2 ng/L (ref <18)

## 2023-01-18 MED ORDER — ALBUTEROL SULFATE HFA 108 (90 BASE) MCG/ACT IN AERS: 2.0000 | INHALATION_SPRAY | Freq: Once | RESPIRATORY_TRACT | Status: DC

## 2023-01-18 MED ORDER — AEROCHAMBER PLUS FLO-VU LARGE MISC
1.0000 | Freq: Once | Status: AC
  Administered 2023-01-18: 1
  Filled 2023-01-18: qty 1

## 2023-01-18 MED ORDER — METHYLPREDNISOLONE SODIUM SUCC 125 MG IJ SOLR
125.0000 mg | Freq: Once | INTRAMUSCULAR | Status: AC
  Administered 2023-01-18: 125 mg via INTRAVENOUS
  Filled 2023-01-18: qty 2

## 2023-01-18 MED ORDER — ALBUTEROL SULFATE HFA 108 (90 BASE) MCG/ACT IN AERS
INHALATION_SPRAY | RESPIRATORY_TRACT | Status: AC
  Administered 2023-01-18: 2 via RESPIRATORY_TRACT
  Filled 2023-01-18: qty 6.7

## 2023-01-18 MED ORDER — METHYLPREDNISOLONE 4 MG PO TBPK: ORAL_TABLET | ORAL | 0 refills | Status: DC

## 2023-01-18 MED ORDER — IOHEXOL 350 MG/ML SOLN
100.0000 mL | Freq: Once | INTRAVENOUS | Status: AC | PRN
  Administered 2023-01-18: 100 mL via INTRAVENOUS

## 2023-01-18 MED ORDER — ALBUTEROL SULFATE HFA 108 (90 BASE) MCG/ACT IN AERS: 2.0000 | INHALATION_SPRAY | RESPIRATORY_TRACT | Status: DC | PRN

## 2023-01-18 MED ORDER — PANTOPRAZOLE SODIUM 40 MG IV SOLR
40.0000 mg | Freq: Once | INTRAVENOUS | Status: AC
  Administered 2023-01-18: 40 mg via INTRAVENOUS
  Filled 2023-01-18: qty 10

## 2023-01-18 MED ORDER — ALBUTEROL SULFATE HFA 108 (90 BASE) MCG/ACT IN AERS: 2.0000 | INHALATION_SPRAY | RESPIRATORY_TRACT | 3 refills | Status: DC | PRN

## 2023-01-18 MED ORDER — IPRATROPIUM-ALBUTEROL 0.5-2.5 (3) MG/3ML IN SOLN
3.0000 mL | Freq: Once | RESPIRATORY_TRACT | Status: AC
  Administered 2023-01-18: 3 mL via RESPIRATORY_TRACT
  Filled 2023-01-18: qty 3

## 2023-01-18 NOTE — ED Notes (Signed)
XR at bedside

## 2023-01-18 NOTE — ED Notes (Signed)
Patient transported to CT 

## 2023-01-18 NOTE — Discharge Instructions (Addendum)
Use your inhaler 2 puffs every 4 hours. Take the steroid taper as prescribed. Get help right away if: You have trouble breathing. Your wheezing and coughing do not get better after taking your medicine. You have chest pain. You have trouble speaking more than one-word sentences. These symptoms may be an emergency. Get help right away. Call 911. Do not wait to see if the symptoms will go away. Do not drive yourself to the hospital.

## 2023-01-18 NOTE — ED Triage Notes (Signed)
Patient here POV from Home.  Endorses Intermittent Cold-Like Symptoms since December. Symptoms include Cough, Congestion. Diagnosed with COVID-19 in November and Bronchitis soon after.  Seeks Evaluation today for continued symptoms and worsened symptoms today.   NAD Noted during Triage. A&Ox4. GCS 15. Ambulatory.

## 2023-01-18 NOTE — ED Provider Notes (Signed)
[facility] Provider Note   CSN: [id number] Arrival date & time: 01/18/23  2007     History {Add pertinent medical, surgical, social history, OB history to HPI:1} Chief Complaint  Patient presents with   Cough    [name] is a 72 y.o. female who presents emergency department with chief complaint of cough and shortness of breath.  Patient has had ongoing persistent cough since she was diagnosed with COVID back in November 2024.  She has had waxing and waning symptoms and episodes of fever.  She has been treated in the past with antibiotics where she has some improvement but it seems to worsen.  She has also had steroids in the past with improvement.  She has persistent rattling cough.  She does admit to having reflux which she feels has been worse since the onset of her COVID.  She went to a large store today to find shoes and when she walked out to her car today and became very winded and short of breath with persistent coughing.  This brought her to the emergency department.  She has not been seen in the emergency department before for the symptoms.  She has been seen by her PCP she has no history of pulmonary issues.  She is not not a smoker.  She denies alcohol or drug abuse.  She has not seen pulmonology for this. Cough      Home Medications Prior to Admission medications   Medication Sig Start Date End Date Taking? Authorizing Provider  ALPRAZolam Duanne Moron) 0.5 MG tablet Take 1 tablet (0.5 mg total) by mouth 2 (two) times daily as needed for anxiety. 10/24/21   Hassell Done, Mary-Margaret, FNP  doxycycline (VIBRA-TABS) 100 MG tablet Take 1 tablet (100 mg total) by mouth 2 (two) times daily. 12/09/22   Evelina Dun A, FNP  escitalopram (LEXAPRO) 20 MG tablet Take 1 tablet (20 mg total) by mouth daily. 09/30/22   Hassell Done, Mary-Margaret, FNP  Evolocumab (REPATHA SURECLICK) [id number] MG/ML SOAJ Inject 150 mg into the skin every 14 (fourteen) days. 10/03/22    Hassell Done, Mary-Margaret, FNP  meclizine (ANTIVERT) 25 MG tablet Take 1 tablet (25 mg total) by mouth 3 (three) times daily as needed for dizziness. 12/30/22   Hassell Done, Mary-Margaret, FNP  tirzepatide (ZEPBOUND) 2.5 MG/0.5ML Pen Inject 2.5 mg into the skin once a week. 12/12/22   Chevis Pretty, FNP      Allergies    Bee venom, Rosuvastatin, and Lipitor [atorvastatin]    Review of Systems   Review of Systems  Respiratory:  Positive for cough.     Physical Exam Updated Vital Signs BP (!) 164/64   Pulse 97   Temp 97.9 F (36.6 C) (Oral)   Resp (!) 23   Ht 5' 4"$  (1.626 m)   Wt 81.6 kg   SpO2 92%   BMI 30.90 kg/m  Physical Exam  ED Results / Procedures / Treatments   Labs (all labs ordered are listed, but only abnormal results are displayed) Labs Reviewed  COMPREHENSIVE METABOLIC PANEL - Abnormal; Notable for the following components:      Result Value   Potassium 3.3 (*)    Glucose, Bld 105 (*)    All other components within normal limits  RESP PANEL BY RT-PCR (RSV, FLU A&B, COVID)  [id number]  CBC WITH DIFFERENTIAL/PLATELET  BRAIN NATRIURETIC PEPTIDE  TROPONIN I (HIGH SENSITIVITY)    EKG None  Radiology CT Angio Chest PE W and/or Wo Contrast  Result Date: 01/18/2023 CLINICAL DATA:  Suspected pulmonary embolism. EXAM: CT ANGIOGRAPHY CHEST WITH CONTRAST TECHNIQUE: Multidetector CT imaging of the chest was performed using the standard protocol during bolus administration of intravenous contrast. Multiplanar CT image reconstructions and MIPs were obtained to evaluate the vascular anatomy. RADIATION DOSE REDUCTION: This exam was performed according to the departmental dose-optimization program which includes automated exposure control, adjustment of the mA and/or kV according to patient size and/or use of iterative reconstruction technique. CONTRAST:  153m OMNIPAQUE IOHEXOL 350 MG/ML SOLN COMPARISON:  None Available. FINDINGS: Cardiovascular: There is mild calcification of the  aortic arch, without evidence of aortic aneurysm. Satisfactory opacification of the pulmonary arteries to the segmental level. No evidence of pulmonary embolism. Normal heart size with mild coronary artery calcification. No pericardial effusion. Mediastinum/Nodes: No enlarged mediastinal, hilar, or axillary lymph nodes. Thyroid gland, trachea, and esophagus demonstrate no significant findings. Lungs/Pleura: There is mild lingular and mild posterior right basilar linear scarring and/or atelectasis. There is no evidence of a pleural effusion or pneumothorax. Upper Abdomen: No acute abnormality. Musculoskeletal: Multilevel degenerative changes seen throughout the thoracic spine. Review of the MIP images confirms the above findings. IMPRESSION: 1. No evidence of pulmonary embolism or other acute intrathoracic process. 2. Mild lingular and posterior right basilar linear scarring and/or atelectasis. 3. Mild coronary artery calcification. 4. Aortic atherosclerosis. Aortic Atherosclerosis (ICD10-I70.0). Electronically Signed   By: TVirgina NorfolkM.D.   On: 01/18/2023 22:48   DG Chest Portable 1 View  Result Date: 01/18/2023 CLINICAL DATA:  Intermittent cold-like symptoms. EXAM: PORTABLE CHEST 1 VIEW COMPARISON:  Chest radiographs 12/09/2022, 06/01/2020 FINDINGS: Cardiac silhouette and mediastinal contours are within normal limits. Mild calcification within the aortic arch. The lungs are clear. No pleural effusion or pneumothorax. Mild multilevel degenerative disc changes of the thoracic spine. IMPRESSION: No active disease. Electronically Signed   By: RYvonne KendallM.D.   On: 01/18/2023 20:35    Procedures Procedures  {Document cardiac monitor, telemetry assessment procedure when appropriate:1}  Medications Ordered in ED Medications  albuterol (VENTOLIN HFA) 108 (90 Base) MCG/ACT inhaler 2 puff (2 puffs Inhalation Given 01/18/23 2249)  ipratropium-albuterol (DUONEB) 0.5-2.5 (3) MG/3ML nebulizer solution 3 mL  (3 mLs Nebulization Given 01/18/23 2157)  methylPREDNISolone sodium succinate (SOLU-MEDROL) 125 mg/2 mL injection 125 mg (125 mg Intravenous Given 01/18/23 2140)  pantoprazole (PROTONIX) injection 40 mg (40 mg Intravenous Given 01/18/23 2140)  AeroChamber Plus Flo-Vu Large MISC 1 each (1 each Other Given 01/18/23 2249)  iohexol (OMNIPAQUE) 350 MG/ML injection 100 mL (100 mLs Intravenous Contrast Given 01/18/23 2231)    ED Course/ Medical Decision Making/ A&P Clinical Course as of 01/18/23 2340  Sat Jan 18, 2023  2305 DG Chest Portable 1 View [AH]    Clinical Course User Index [AH] HMargarita Mail PA-C   {   Click here for ABCD2, HEART and other calculatorsREFRESH Note before signing :1}                          Medical Decision Making Amount and/or Complexity of Data Reviewed Labs: ordered. Radiology: ordered. Decision-making details documented in ED Course.  Risk Prescription drug management.   ***  {Document critical care time when appropriate:1} {Document review of labs and clinical decision tools ie heart score, Chads2Vasc2 etc:1}  {Document your independent review of radiology images, and any outside records:1} {Document your discussion with family members, caretakers, and with consultants:1} {Document social determinants of health affecting pt's care:1} {Document your  decision making why or why not admission, treatments were needed:1} Final Clinical Impression(s) / ED Diagnoses Final diagnoses:  None    Rx / DC Orders ED Discharge Orders     None

## 2023-01-18 NOTE — ED Notes (Signed)
RT educated pt on proper use of MDI w/spacer. Pt able to perform w/out difficulty. Pt verbalizes understanding.

## 2023-01-18 NOTE — ED Notes (Signed)
Pt O2 93%, HR 110 with ambulation, Pt experienced mild SOB on returning to room. PA Harris notified.

## 2023-01-19 NOTE — ED Notes (Signed)
Reviewed AVS with patient, patient expressed understanding of directions, denies further questions at this time.

## 2023-01-21 ENCOUNTER — Encounter (HOSPITAL_BASED_OUTPATIENT_CLINIC_OR_DEPARTMENT_OTHER): Payer: Self-pay

## 2023-01-21 ENCOUNTER — Emergency Department (HOSPITAL_BASED_OUTPATIENT_CLINIC_OR_DEPARTMENT_OTHER): Payer: Medicare HMO

## 2023-01-21 ENCOUNTER — Other Ambulatory Visit: Payer: Self-pay

## 2023-01-21 ENCOUNTER — Emergency Department (HOSPITAL_BASED_OUTPATIENT_CLINIC_OR_DEPARTMENT_OTHER): Payer: Medicare HMO | Admitting: Radiology

## 2023-01-21 ENCOUNTER — Inpatient Hospital Stay (HOSPITAL_BASED_OUTPATIENT_CLINIC_OR_DEPARTMENT_OTHER)
Admission: EM | Admit: 2023-01-21 | Discharge: 2023-01-24 | DRG: 193 | Disposition: A | Discharge status: Home / Self Care | Payer: Medicare HMO | Attending: Internal Medicine | Admitting: Internal Medicine

## 2023-01-21 DIAGNOSIS — Z79899 Other long term (current) drug therapy: Secondary | ICD-10-CM

## 2023-01-21 DIAGNOSIS — J189 Pneumonia, unspecified organism: Secondary | ICD-10-CM | POA: Diagnosis not present

## 2023-01-21 DIAGNOSIS — Z1152 Encounter for screening for COVID-19: Secondary | ICD-10-CM

## 2023-01-21 DIAGNOSIS — R000 Tachycardia, unspecified: Secondary | ICD-10-CM | POA: Diagnosis not present

## 2023-01-21 DIAGNOSIS — J40 Bronchitis, not specified as acute or chronic: Secondary | ICD-10-CM | POA: Diagnosis present

## 2023-01-21 DIAGNOSIS — E669 Obesity, unspecified: Secondary | ICD-10-CM | POA: Diagnosis present

## 2023-01-21 DIAGNOSIS — Z888 Allergy status to other drugs, medicaments and biological substances status: Secondary | ICD-10-CM

## 2023-01-21 DIAGNOSIS — Z7985 Long-term (current) use of injectable non-insulin antidiabetic drugs: Secondary | ICD-10-CM

## 2023-01-21 DIAGNOSIS — E876 Hypokalemia: Secondary | ICD-10-CM | POA: Diagnosis not present

## 2023-01-21 DIAGNOSIS — E785 Hyperlipidemia, unspecified: Secondary | ICD-10-CM | POA: Diagnosis present

## 2023-01-21 DIAGNOSIS — F411 Generalized anxiety disorder: Secondary | ICD-10-CM | POA: Diagnosis not present

## 2023-01-21 DIAGNOSIS — R112 Nausea with vomiting, unspecified: Secondary | ICD-10-CM | POA: Diagnosis not present

## 2023-01-21 DIAGNOSIS — Z8249 Family history of ischemic heart disease and other diseases of the circulatory system: Secondary | ICD-10-CM

## 2023-01-21 DIAGNOSIS — Z6830 Body mass index (BMI) 30.0-30.9, adult: Secondary | ICD-10-CM | POA: Diagnosis not present

## 2023-01-21 DIAGNOSIS — J181 Lobar pneumonia, unspecified organism: Secondary | ICD-10-CM | POA: Diagnosis not present

## 2023-01-21 DIAGNOSIS — J9601 Acute respiratory failure with hypoxia: Secondary | ICD-10-CM | POA: Diagnosis present

## 2023-01-21 DIAGNOSIS — Z91030 Bee allergy status: Secondary | ICD-10-CM

## 2023-01-21 DIAGNOSIS — R079 Chest pain, unspecified: Secondary | ICD-10-CM | POA: Diagnosis not present

## 2023-01-21 DIAGNOSIS — R296 Repeated falls: Secondary | ICD-10-CM | POA: Diagnosis present

## 2023-01-21 DIAGNOSIS — R69 Illness, unspecified: Secondary | ICD-10-CM | POA: Diagnosis not present

## 2023-01-21 DIAGNOSIS — J168 Pneumonia due to other specified infectious organisms: Secondary | ICD-10-CM | POA: Diagnosis not present

## 2023-01-21 DIAGNOSIS — J9811 Atelectasis: Secondary | ICD-10-CM | POA: Diagnosis not present

## 2023-01-21 DIAGNOSIS — R059 Cough, unspecified: Secondary | ICD-10-CM | POA: Diagnosis not present

## 2023-01-21 LAB — CBC
HCT: 40.3 % (ref 36.0–46.0)
HCT: 34 % — ABNORMAL LOW (ref 36.0–46.0)
Hemoglobin: 13.4 g/dL (ref 12.0–15.0)
Hemoglobin: 11.2 g/dL — ABNORMAL LOW (ref 12.0–15.0)
MCH: 29.6 pg (ref 26.0–34.0)
MCH: 29.9 pg (ref 26.0–34.0)
MCHC: 33.3 g/dL (ref 30.0–36.0)
MCHC: 32.9 g/dL (ref 30.0–36.0)
MCV: 89.2 fL (ref 80.0–100.0)
MCV: 90.7 fL (ref 80.0–100.0)
Platelets: 200 10*3/uL (ref 150–400)
Platelets: 160 10*3/uL (ref 150–400)
RBC: 4.52 MIL/uL (ref 3.87–5.11)
RBC: 3.75 MIL/uL — ABNORMAL LOW (ref 3.87–5.11)
RDW: 13.5 % (ref 11.5–15.5)
RDW: 13.3 % (ref 11.5–15.5)
WBC: 9.6 10*3/uL (ref 4.0–10.5)
WBC: 7.7 10*3/uL (ref 4.0–10.5)
nRBC: 0 % (ref 0.0–0.2)
nRBC: 0 % (ref 0.0–0.2)

## 2023-01-21 LAB — CREATININE, SERUM
Creatinine, Ser: 0.88 mg/dL (ref 0.44–1.00)
GFR, Estimated: >60 mL/min (ref >60)

## 2023-01-21 LAB — BASIC METABOLIC PANEL
Anion gap: 12 (ref 5–15)
BUN: 10 mg/dL (ref 8–23)
CO2: 23 mmol/L (ref 22–32)
Calcium: 9.3 mg/dL (ref 8.9–10.3)
Chloride: 101 mmol/L (ref 98–111)
Creatinine, Ser: 0.7 mg/dL (ref 0.44–1.00)
GFR, Estimated: >60 mL/min (ref >60)
Glucose, Bld: 105 mg/dL — ABNORMAL HIGH (ref 70–99)
Potassium: 3.6 mmol/L (ref 3.5–5.1)
Sodium: 136 mmol/L (ref 135–145)

## 2023-01-21 LAB — TROPONIN I (HIGH SENSITIVITY)
Troponin I (High Sensitivity): 3 ng/L (ref <18)
Troponin I (High Sensitivity): 3 ng/L (ref <18)

## 2023-01-21 LAB — STREP PNEUMONIAE URINARY ANTIGEN: Strep Pneumo Urinary Antigen: NEGATIVE

## 2023-01-21 MED ORDER — ONDANSETRON HCL 4 MG/2ML IJ SOLN: 4.0000 mg | Freq: Four times a day (QID) | INTRAMUSCULAR | Status: DC | PRN

## 2023-01-21 MED ORDER — ENOXAPARIN SODIUM 40 MG/0.4ML IJ SOSY
40.0000 mg | PREFILLED_SYRINGE | INTRAMUSCULAR | Status: DC
  Administered 2023-01-21 – 2023-01-23 (×3): 40 mg via SUBCUTANEOUS
  Filled 2023-01-21 (×3): qty 0.4

## 2023-01-21 MED ORDER — ALBUTEROL SULFATE (2.5 MG/3ML) 0.083% IN NEBU: 2.5000 mg | INHALATION_SOLUTION | RESPIRATORY_TRACT | Status: DC | PRN

## 2023-01-21 MED ORDER — SODIUM CHLORIDE 0.9 % IV SOLN
2.0000 g | INTRAVENOUS | Status: DC
  Administered 2023-01-22 – 2023-01-23 (×2): 2 g via INTRAVENOUS
  Filled 2023-01-21 (×2): qty 20

## 2023-01-21 MED ORDER — IOHEXOL 350 MG/ML SOLN
100.0000 mL | Freq: Once | INTRAVENOUS | Status: AC | PRN
  Administered 2023-01-21: 75 mL via INTRAVENOUS

## 2023-01-21 MED ORDER — SODIUM CHLORIDE 0.9 % IV SOLN
1.0000 g | Freq: Once | INTRAVENOUS | Status: AC
  Administered 2023-01-21: 1 g via INTRAVENOUS
  Filled 2023-01-21: qty 10

## 2023-01-21 MED ORDER — SODIUM CHLORIDE 0.9 % IV SOLN: Freq: Once | INTRAVENOUS | Status: AC

## 2023-01-21 MED ORDER — ACETAMINOPHEN 325 MG PO TABS: 650.0000 mg | ORAL_TABLET | Freq: Four times a day (QID) | ORAL | Status: DC | PRN

## 2023-01-21 MED ORDER — GUAIFENESIN ER 600 MG PO TB12
1200.0000 mg | ORAL_TABLET | Freq: Two times a day (BID) | ORAL | Status: DC
  Administered 2023-01-21 – 2023-01-24 (×6): 1200 mg via ORAL
  Filled 2023-01-21 (×7): qty 2

## 2023-01-21 MED ORDER — MECLIZINE HCL 25 MG PO TABS
25.0000 mg | ORAL_TABLET | Freq: Three times a day (TID) | ORAL | Status: DC | PRN
  Administered 2023-01-21: 25 mg via ORAL
  Filled 2023-01-21: qty 1

## 2023-01-21 MED ORDER — GUAIFENESIN 100 MG/5ML PO LIQD: 5.0000 mL | ORAL | Status: DC | PRN

## 2023-01-21 MED ORDER — LACTATED RINGERS IV BOLUS
1000.0000 mL | Freq: Once | INTRAVENOUS | Status: AC
  Administered 2023-01-21: 1000 mL via INTRAVENOUS

## 2023-01-21 MED ORDER — ALPRAZOLAM 0.5 MG PO TABS
0.5000 mg | ORAL_TABLET | Freq: Two times a day (BID) | ORAL | Status: DC | PRN
  Administered 2023-01-21 – 2023-01-23 (×3): 0.5 mg via ORAL
  Filled 2023-01-21 (×3): qty 1

## 2023-01-21 MED ORDER — SODIUM CHLORIDE 0.9 % IV SOLN
500.0000 mg | INTRAVENOUS | Status: DC
  Administered 2023-01-22: 500 mg via INTRAVENOUS
  Filled 2023-01-21 (×2): qty 5

## 2023-01-21 MED ORDER — ESCITALOPRAM OXALATE 10 MG PO TABS
20.0000 mg | ORAL_TABLET | Freq: Every day | ORAL | Status: DC
  Administered 2023-01-22 – 2023-01-24 (×3): 20 mg via ORAL
  Filled 2023-01-21 (×3): qty 2

## 2023-01-21 MED ORDER — SODIUM CHLORIDE 0.9 % IV SOLN
500.0000 mg | Freq: Once | INTRAVENOUS | Status: AC
  Administered 2023-01-21: 500 mg via INTRAVENOUS
  Filled 2023-01-21: qty 5

## 2023-01-21 MED ORDER — BENZONATATE 100 MG PO CAPS: 100.0000 mg | ORAL_CAPSULE | Freq: Three times a day (TID) | ORAL | Status: DC | PRN

## 2023-01-21 MED ORDER — TIRZEPATIDE-WEIGHT MANAGEMENT 2.5 MG/0.5ML ~~LOC~~ SOAJ: 2.5000 mg | SUBCUTANEOUS | Status: DC

## 2023-01-21 MED ORDER — IPRATROPIUM-ALBUTEROL 0.5-2.5 (3) MG/3ML IN SOLN
6.0000 mL | Freq: Once | RESPIRATORY_TRACT | Status: AC
  Administered 2023-01-21: 6 mL via RESPIRATORY_TRACT
  Filled 2023-01-21: qty 6

## 2023-01-21 NOTE — ED Triage Notes (Signed)
Patient here POV from Home.  Endorses being seen recently 3 Days ago for Cough, SOB. States she felt better upon discharge but then symptoms worsened recently and began to have Back Pain, CP and N/V associated with a headache.  NAD Noted during Triage. A&Ox4. GCS 15. Ambulatory.

## 2023-01-21 NOTE — ED Notes (Signed)
Pt has stress incontinence when coughing. Pt given blue scrub pants, mesh panties, pad, new linens. Strong urine smell in room; pt giving urine sample at this time.

## 2023-01-21 NOTE — H&P (Signed)
PCP:   Chevis Pretty, FNP   Chief Complaint:  Shortness of breath  HPI: This is a 72 year old female with past medical history of GAD and hyperlipidemia.  Per patient she has been having ongoing cough, wheeze, waxing fever and shortness of breath since her COVID diagnosis in 11/23.   She had been given diagnosis of bronchitis which had been treated with antibiotics and steroids without improvement.  01/18/2023  she presented to [facility] ER her on going bronchitis symptoms.  CTA chest negative for infection.  She was wheezing on exam and treated for bronchospasm, discharged with albuterol MDI and steroids.  Days after discharge she developed chills, nausea, vomiting, weakness, lightheadedness, shortness of breath, chills, poor appetite, ongoing cough and wheeze and a headache.  She denies fever.  She returned to [facility] ER.  The ER patient hypoxic and CT chest positive for right lower lobe consolidation.  Patient sats was in the 80s, she has been placed on 2 L oxygen and transferred to [facility].  Review of Systems:  The patient denies weight loss, vision loss, decreased hearing, hoarseness, chest pain, syncope, dyspnea on exertion, peripheral edema, balance deficits, hemoptysis, abdominal pain, melena, hematochezia, severe indigestion/heartburn, hematuria, incontinence, genital sores, muscle weakness, suspicious skin lesions, transient blindness, difficulty walking, depression, unusual weight change, abnormal bleeding, enlarged lymph nodes, angioedema, and breast masses.  Positives: Shortness of breath, cough, wheeze, fever, nausea, vomiting, weakness, lightheadedness, chills, anorexia, headache  Past Medical History: History reviewed. No pertinent past medical history. Past Surgical History:  Procedure Laterality Date   BACK SURGERY     CESAREAN SECTION     KNEE SURGERY     Bilaterally    Medications: Prior to Admission medications   Medication Sig Start Date End Date Taking? Authorizing  Provider  albuterol (VENTOLIN HFA) 108 (90 Base) MCG/ACT inhaler Inhale 2 puffs into the lungs every 4 (four) hours as needed for wheezing or shortness of breath. 01/18/23   Margarita Mail, PA-C  ALPRAZolam Duanne Moron) 0.5 MG tablet Take 1 tablet (0.5 mg total) by mouth 2 (two) times daily as needed for anxiety. 10/24/21   Hassell Done, Mary-Margaret, FNP  doxycycline (VIBRA-TABS) 100 MG tablet Take 1 tablet (100 mg total) by mouth 2 (two) times daily. 12/09/22   Evelina Dun A, FNP  escitalopram (LEXAPRO) 20 MG tablet Take 1 tablet (20 mg total) by mouth daily. 09/30/22   Hassell Done, Mary-Margaret, FNP  Evolocumab (REPATHA SURECLICK) [id number] MG/ML SOAJ Inject 150 mg into the skin every 14 (fourteen) days. 10/03/22   Hassell Done, Mary-Margaret, FNP  meclizine (ANTIVERT) 25 MG tablet Take 1 tablet (25 mg total) by mouth 3 (three) times daily as needed for dizziness. 12/30/22   Chevis Pretty, FNP  methylPREDNISolone (MEDROL DOSEPAK) 4 MG TBPK tablet Use as directed 01/18/23   Margarita Mail, PA-C  tirzepatide [facility]) 2.5 MG/0.5ML Pen Inject 2.5 mg into the skin once a week. 12/12/22   Chevis Pretty, FNP    Allergies:   Allergies  Allergen Reactions   Bee Venom Swelling   Rosuvastatin Other (See Comments)    myopathy   Lipitor [Atorvastatin] Other (See Comments)    myopathy    Social History:  reports that she has never smoked. She has never used smokeless tobacco. She reports that she does not drink alcohol and does not use drugs.  Family History: Family History  Problem Relation Age of Onset   Heart disease Mother    Hypertension Mother    Alcohol abuse Father    Breast cancer Neg  Hx     Physical Exam: Vitals:   01/21/23 1707 01/21/23 1800 01/21/23 2000 01/21/23 2129  BP:  (!) 142/60 121/67 (!) 128/54  Pulse:  (!) 112 (!) 108 100  Resp:  '16 18 18  '$ Temp: 98.5 F (36.9 C)   98.6 F (37 C)  TempSrc:    Oral  SpO2:  93% 92% 96%  Weight:      Height:        General:  Alert and  oriented times three, well developed and nourished, no acute distress Eyes: PERRLA, pink conjunctiva, no scleral icterus ENT: Moist oral mucosa, neck supple, no thyromegaly Lungs: decreased breath sounds RLL, no wheeze, no crackles, no use of accessory muscles Cardiovascular: tachy, regular rate and rhythm, no regurgitation, no gallops, no murmurs. No carotid bruits, no JVD Abdomen: soft, positive BS, non-tender, non-distended, no organomegaly, not an acute abdomen GU: not examined Neuro: CN II - XII grossly intact, sensation intact Musculoskeletal: strength 5/5 all extremities, no clubbing, cyanosis or edema Skin: no rash, no subcutaneous crepitation, no decubitus Psych: appropriate patient   Labs on Admission:  Recent Labs    01/21/23 1245  NA 136  K 3.6  CL 101  CO2 23  GLUCOSE 105*  BUN 10  CREATININE 0.70  CALCIUM 9.3    Micro Results: Recent Results (from the past 240 hour(s))  Resp panel by RT-PCR (RSV, Flu A&B, Covid) Anterior Nasal Swab     Status: None   Collection Time: 01/18/23  8:37 PM   Specimen: Anterior Nasal Swab  Result Value Ref Range Status   SARS Coronavirus 2 by RT PCR NEGATIVE NEGATIVE Final    Comment: (NOTE) SARS-CoV-2 target nucleic acids are NOT DETECTED.  The SARS-CoV-2 RNA is generally detectable in upper respiratory specimens during the acute phase of infection. The lowest concentration of SARS-CoV-2 viral copies this assay can detect is 138 copies/mL. A negative result does not preclude SARS-Cov-2 infection and should not be used as the sole basis for treatment or other patient management decisions. A negative result may occur with  improper specimen collection/handling, submission of specimen other than nasopharyngeal swab, presence of viral mutation(s) within the areas targeted by this assay, and inadequate number of viral copies(<138 copies/mL). A negative result must be combined with clinical observations, patient history, and  epidemiological information. The expected result is Negative.  Fact Sheet for Patients:  EntrepreneurPulse.com.au  Fact Sheet for Healthcare Providers:  IncredibleEmployment.be  This test is no t yet approved or cleared by the [place] FDA and  has been authorized for detection and/or diagnosis of SARS-CoV-2 by FDA under an Emergency Use Authorization (EUA). This EUA will remain  in effect (meaning this test can be used) for the duration of the COVID-19 declaration under Section 564(b)(1) of the Act, 21 U.S.C.section 360bbb-3(b)(1), unless the authorization is terminated  or revoked sooner.       Influenza A by PCR NEGATIVE NEGATIVE Final   Influenza B by PCR NEGATIVE NEGATIVE Final    Comment: (NOTE) The Xpert Xpress SARS-CoV-2/FLU/RSV plus assay is intended as an aid in the diagnosis of influenza from Nasopharyngeal swab specimens and should not be used as a sole basis for treatment. Nasal washings and aspirates are unacceptable for Xpert Xpress SARS-CoV-2/FLU/RSV testing.  Fact Sheet for Patients: EntrepreneurPulse.com.au  Fact Sheet for Healthcare Providers: IncredibleEmployment.be  This test is not yet approved or cleared by the [place] FDA and has been authorized for detection and/or diagnosis of SARS-CoV-2 by FDA  under an Emergency Use Authorization (EUA). This EUA will remain in effect (meaning this test can be used) for the duration of the COVID-19 declaration under Section 564(b)(1) of the Act, 21 U.S.C. section 360bbb-3(b)(1), unless the authorization is terminated or revoked.     Resp Syncytial Virus by PCR NEGATIVE NEGATIVE Final    Comment: (NOTE) Fact Sheet for Patients: EntrepreneurPulse.com.au  Fact Sheet for Healthcare Providers: IncredibleEmployment.be  This test is not yet approved or cleared by the [place] FDA and has been  authorized for detection and/or diagnosis of SARS-CoV-2 by FDA under an Emergency Use Authorization (EUA). This EUA will remain in effect (meaning this test can be used) for the duration of the COVID-19 declaration under Section 564(b)(1) of the Act, 21 U.S.C. section 360bbb-3(b)(1), unless the authorization is terminated or revoked.  Performed at [facility], [address] [place], [place] [address]      Radiological Exams on Admission: CT Angio Chest PE W and/or Wo Contrast  Result Date: 01/21/2023 CLINICAL DATA:  Cough and shortness of breath. Back pain and chest pain. Nausea and vomiting. Headache. EXAM: CT ANGIOGRAPHY CHEST WITH CONTRAST TECHNIQUE: Multidetector CT imaging of the chest was performed using the standard protocol during bolus administration of intravenous contrast. Multiplanar CT image reconstructions and MIPs were obtained to evaluate the vascular anatomy. RADIATION DOSE REDUCTION: This exam was performed according to the departmental dose-optimization program which includes automated exposure control, adjustment of the mA and/or kV according to patient size and/or use of iterative reconstruction technique. CONTRAST:  74m OMNIPAQUE IOHEXOL 350 MG/ML SOLN COMPARISON:  01/18/2023 FINDINGS: Cardiovascular: Contrast bolus timing suboptimal but adequate to assess the pulmonary arteries. No filling defect is identified in the pulmonary arterial tree to suggest pulmonary embolus. Mild aortic arch atherosclerotic vascular disease. Left anterior descending coronary artery atherosclerosis. No acute vascular findings in the chest. Mediastinum/Nodes: Right paratracheal node 1.0 cm in short axis on image 34 series 4, previously 0.7 cm. AP window lymph node 0.7 cm in short axis on image 45 series 4, previously same. Subcarinal lymph node 1.4 cm in short axis on image 56 series 4, previously 0.8 cm. Right infrahilar lymph node 0.7 cm in short axis on image 67 series 4,  previously 0.5 cm. Lungs/Pleura: Increase consolidation in the right lower lobe and lingula. Bandlike scarring/volume loss also noted in both regions. There is some hazy ground-glass opacity and linear opacity in the left lower lobe which is new compared to the prior exam and may reflect early infection. Upper Abdomen: Unremarkable Musculoskeletal: Thoracic spondylosis. Review of the MIP images confirms the above findings. IMPRESSION: 1. No filling defect is identified in the pulmonary arterial tree to suggest pulmonary embolus. 2. Increase in consolidation in the right lower lobe and lingula favoring multilobar pneumonia. There is also some new hazy ground-glass opacity and linear opacity in the left lower lobe which may reflect early infection. 3. Mild increase in size of right paratracheal, subcarinal, and right infrahilar lymph nodes likely reactive. 4. Left anterior descending coronary artery atherosclerosis. Aortic Atherosclerosis (ICD10-I70.0). Electronically Signed   By: WVan ClinesM.D.   On: 01/21/2023 16:44   DG Chest 2 View  Result Date: 01/21/2023 CLINICAL DATA:  Several day history of worsening cough and shortness of breath associated with chest pain, vomiting, and dizziness EXAM: CHEST - 2 VIEW COMPARISON:  Chest radiograph dated 01/18/2023 CT chest dated 01/18/2023 FINDINGS: Normal lung volumes. Curvilinear lingular opacity. Linear right lower lobe subsegmental atelectasis. No pleural effusion or  pneumothorax. The heart size and mediastinal contours are within normal limits. The visualized skeletal structures are unremarkable. IMPRESSION: 1. Curvilinear lingular opacity may reflect atelectasis or early pneumonia. 2. Linear right lower lobe subsegmental atelectasis. Electronically Signed   By: Darrin Nipper M.D.   On: 01/21/2023 13:08    Assessment/Plan Present on Admission:  Multifocal pneumonia  Acute resp failure 2L oxygen -Pneumonia order set initiated -Blood cultures x 2 -IV  Rocephin and azithromycin ordered -Oxygen to keep sats greater than 88% -As needed nebulizers   GAD (generalized anxiety disorder) -Patient Xanax and Lexapro resumed   Ryken Paschal 01/21/2023, 10:23 PM

## 2023-01-21 NOTE — ED Notes (Signed)
Pt placed on 2LPM of O2, per Caryl Pina RT... Pts SpO2 is now 95%.Marland KitchenMarland Kitchen

## 2023-01-21 NOTE — ED Notes (Signed)
Report given to [facility] and the Floor.

## 2023-01-21 NOTE — ED Provider Notes (Signed)
Patient signed out to me by previous provider. Please refer to their note for full HPI.  Briefly this is a 72 year old female who presented to the emergency department with ongoing shortness of breath, cough.  Patient was hypoxic down to 88% on room air at rest, tachycardic.  Afebrile.  Recent workup for respiratory illness including CT PE study, has been on outpatient oral antibiotics for presumed bronchitis without any improvement.  Patient signed out pending repeat CT PE study given worsening condition.  CT study shows multi lobar findings in the right lung consistent with pneumonia as well as changes in the left lung with associated lymphadenopathy.  Patient currently is requiring 2 L nasal cannula, ill-appearing but not in acute distress.  Will plan for IV antibiotics, admission.  Patients evaluation and results requires admission for further treatment and care.  Spoke with hospitalist, reviewed patient's ED course and they accept admission.  Patient agrees with admission plan, offers no new complaints and is stable/unchanged at time of admit.   Lorelle Gibbs, DO 01/21/23 [id number]

## 2023-01-21 NOTE — ED Provider Notes (Signed)
[facility] Provider Note   CSN: [id number] Arrival date & time: 01/21/23  1234     History  Chief Complaint  Patient presents with   Cough    [name] is a 72 y.o. female.   Cough Associated symptoms: chest pain and shortness of breath      72 year old female presenting to the emergency department with a productive cough.  The patient states that she was seen in the emergency department 3 days ago and had negative CT imaging and overall reassuring workup and was subsequently discharged on a steroid.  She had presented with a cough and shortness of breath.  She states that her symptoms worsened over the past few days with increasing production of sputum, described as yellow and thick.  She endorses chills.  She endorses pleuritic chest discomfort.  She had CTA imaging that was negative for PE and negative for pneumonia during her recent ER visit.  Home Medications Prior to Admission medications   Medication Sig Start Date End Date Taking? Authorizing Provider  albuterol (VENTOLIN HFA) 108 (90 Base) MCG/ACT inhaler Inhale 2 puffs into the lungs every 4 (four) hours as needed for wheezing or shortness of breath. 01/18/23   Margarita Mail, PA-C  ALPRAZolam Duanne Moron) 0.5 MG tablet Take 1 tablet (0.5 mg total) by mouth 2 (two) times daily as needed for anxiety. 10/24/21   Hassell Done, Mary-Margaret, FNP  doxycycline (VIBRA-TABS) 100 MG tablet Take 1 tablet (100 mg total) by mouth 2 (two) times daily. 12/09/22   Evelina Dun A, FNP  escitalopram (LEXAPRO) 20 MG tablet Take 1 tablet (20 mg total) by mouth daily. 09/30/22   Hassell Done, Mary-Margaret, FNP  Evolocumab (REPATHA SURECLICK) [id number] MG/ML SOAJ Inject 150 mg into the skin every 14 (fourteen) days. 10/03/22   Hassell Done, Mary-Margaret, FNP  meclizine (ANTIVERT) 25 MG tablet Take 1 tablet (25 mg total) by mouth 3 (three) times daily as needed for dizziness. 12/30/22   Chevis Pretty, FNP   methylPREDNISolone (MEDROL DOSEPAK) 4 MG TBPK tablet Use as directed 01/18/23   Margarita Mail, PA-C  tirzepatide [facility]) 2.5 MG/0.5ML Pen Inject 2.5 mg into the skin once a week. 12/12/22   Hassell Done Mary-Margaret, FNP      Allergies    Bee venom, Rosuvastatin, and Lipitor [atorvastatin]    Review of Systems   Review of Systems  Respiratory:  Positive for cough and shortness of breath.   Cardiovascular:  Positive for chest pain.  All other systems reviewed and are negative.   Physical Exam Updated Vital Signs BP (!) 166/73 (BP Location: Right Arm)   Pulse (!) 118   Temp 98.7 F (37.1 C) (Oral)   Resp 20   Ht '5\' 4"'$  (1.626 m)   Wt 81.6 kg   SpO2 96%   BMI 30.88 kg/m  Physical Exam Vitals and nursing note reviewed.  Constitutional:      General: She is not in acute distress.    Appearance: She is well-developed. She is ill-appearing.  HENT:     Head: Normocephalic and atraumatic.  Eyes:     Conjunctiva/sclera: Conjunctivae normal.  Cardiovascular:     Rate and Rhythm: Regular rhythm. Tachycardia present.     Pulses: Normal pulses.  Pulmonary:     Effort: Pulmonary effort is normal. No respiratory distress.     Breath sounds: Rhonchi present.     Comments: Bibasilar rhonchi Abdominal:     Palpations: Abdomen is soft.     Tenderness: There  is no abdominal tenderness.  Musculoskeletal:        General: No swelling.     Cervical back: Neck supple.  Skin:    General: Skin is warm and dry.     Capillary Refill: Capillary refill takes less than 2 seconds.  Neurological:     Mental Status: She is alert.  Psychiatric:        Mood and Affect: Mood normal.     ED Results / Procedures / Treatments   Labs (all labs ordered are listed, but only abnormal results are displayed) Labs Reviewed  CBC  BASIC METABOLIC PANEL  TROPONIN I (HIGH SENSITIVITY)    EKG EKG Interpretation  Date/Time:  Tuesday January 21 2023 12:45:12 EST Ventricular Rate:  118 PR  Interval:  116 QRS Duration: 80 QT Interval:  302 QTC Calculation: 423 R Axis:   60 Text Interpretation: Sinus tachycardia Cannot rule out Anterior infarct , age undetermined Abnormal ECG When compared with ECG of 18-Jan-2023 21:41, PREVIOUS ECG IS PRESENT Confirmed by Regan Lemming ([id number]) on 01/21/2023 1:11:01 PM  Radiology DG Chest 2 View  Result Date: 01/21/2023 CLINICAL DATA:  Several day history of worsening cough and shortness of breath associated with chest pain, vomiting, and dizziness EXAM: CHEST - 2 VIEW COMPARISON:  Chest radiograph dated 01/18/2023 CT chest dated 01/18/2023 FINDINGS: Normal lung volumes. Curvilinear lingular opacity. Linear right lower lobe subsegmental atelectasis. No pleural effusion or pneumothorax. The heart size and mediastinal contours are within normal limits. The visualized skeletal structures are unremarkable. IMPRESSION: 1. Curvilinear lingular opacity may reflect atelectasis or early pneumonia. 2. Linear right lower lobe subsegmental atelectasis. Electronically Signed   By: Darrin Nipper M.D.   On: 01/21/2023 13:08    Procedures Procedures    Medications Ordered in ED Medications - No data to display  ED Course/ Medical Decision Making/ A&P                             Medical Decision Making Amount and/or Complexity of Data Reviewed Labs: ordered. Radiology: ordered.  Risk Prescription drug management. Decision regarding hospitalization.     72 year old female presenting to the emergency department with a productive cough.  The patient states that she was seen in the emergency department 3 days ago and had negative CT imaging and overall reassuring workup and was subsequently discharged on a steroid.  She had presented with a cough and shortness of breath.  She states that her symptoms worsened over the past few days with increasing production of sputum, described as yellow and thick.  She endorses chills.  She endorses pleuritic chest discomfort.   She had CTA imaging that was negative for PE and negative for pneumonia during her recent ER visit.  On arrival, the patient was afebrile, tachycardic heart rate 118, not tachypneic RR 20, BP 166/73, saturating initially 96% on room air.  Physical exam significant for bibasilar rhonchi, mildly ill-appearing female, tachycardia noted, sinus tachycardia noted on telemetry.  Differential diagnosis includes PE, pneumothorax, pneumonia, viral infection.  Patient sats hovering around 93% on room air.   Initial workup significant for CBC without a leukocytosis or anemia, BMP unremarkable, troponin negative.  Chest x-ray revealed possible infiltrate which may reflect atelectasis or early pneumonia.  Given the patient's clinical deterioration, will repeat CTA imaging to reevaluate for PE versus developing pneumonia.  Plan at time of signout to follow-up CT imaging, reassess the patient.  Patient not meeting SIRS criteria  on arrival.  Given the patient's low oxygenation, consideration given to admission for observation, ultimately pending reassessment and CT results.  Signout given to Dr. Dina Rich at 1500.   Final Clinical Impression(s) / ED Diagnoses Final diagnoses:  None    Rx / DC Orders ED Discharge Orders     None         Regan Lemming, MD 01/21/23 1950

## 2023-01-22 DIAGNOSIS — J9601 Acute respiratory failure with hypoxia: Secondary | ICD-10-CM | POA: Insufficient documentation

## 2023-01-22 LAB — CBC WITH DIFFERENTIAL/PLATELET
Abs Immature Granulocytes: 0.02 10*3/uL (ref 0.00–0.07)
Basophils Absolute: 0.1 10*3/uL (ref 0.0–0.1)
Basophils Relative: 1 %
Eosinophils Absolute: 0.1 10*3/uL (ref 0.0–0.5)
Eosinophils Relative: 1 %
HCT: 33.6 % — ABNORMAL LOW (ref 36.0–46.0)
Hemoglobin: 11.2 g/dL — ABNORMAL LOW (ref 12.0–15.0)
Immature Granulocytes: 0 %
Lymphocytes Relative: 30 %
Lymphs Abs: 2.2 10*3/uL (ref 0.7–4.0)
MCH: 29.8 pg (ref 26.0–34.0)
MCHC: 33.3 g/dL (ref 30.0–36.0)
MCV: 89.4 fL (ref 80.0–100.0)
Monocytes Absolute: 0.5 10*3/uL (ref 0.1–1.0)
Monocytes Relative: 6 %
Neutro Abs: 4.7 10*3/uL (ref 1.7–7.7)
Neutrophils Relative %: 62 %
Platelets: 152 10*3/uL (ref 150–400)
RBC: 3.76 MIL/uL — ABNORMAL LOW (ref 3.87–5.11)
RDW: 13.3 % (ref 11.5–15.5)
WBC: 7.5 10*3/uL (ref 4.0–10.5)
nRBC: 0 % (ref 0.0–0.2)

## 2023-01-22 LAB — STREP PNEUMONIAE URINARY ANTIGEN: Strep Pneumo Urinary Antigen: NEGATIVE

## 2023-01-22 LAB — BASIC METABOLIC PANEL
Anion gap: 11 (ref 5–15)
BUN: 7 mg/dL — ABNORMAL LOW (ref 8–23)
CO2: 22 mmol/L (ref 22–32)
Calcium: 8.1 mg/dL — ABNORMAL LOW (ref 8.9–10.3)
Chloride: 104 mmol/L (ref 98–111)
Creatinine, Ser: 0.74 mg/dL (ref 0.44–1.00)
GFR, Estimated: >60 mL/min (ref >60)
Glucose, Bld: 106 mg/dL — ABNORMAL HIGH (ref 70–99)
Potassium: 3.1 mmol/L — ABNORMAL LOW (ref 3.5–5.1)
Sodium: 137 mmol/L (ref 135–145)

## 2023-01-22 LAB — PROCALCITONIN: Procalcitonin: 0.17 ng/mL

## 2023-01-22 LAB — MAGNESIUM: Magnesium: 2.1 mg/dL (ref 1.7–2.4)

## 2023-01-22 MED ORDER — POTASSIUM CHLORIDE CRYS ER 20 MEQ PO TBCR
40.0000 meq | EXTENDED_RELEASE_TABLET | Freq: Two times a day (BID) | ORAL | Status: AC
  Administered 2023-01-22 (×2): 40 meq via ORAL
  Filled 2023-01-22 (×2): qty 2

## 2023-01-22 NOTE — Progress Notes (Signed)
[facility] PROGRESS NOTE    Progress Note  [name]  [id number] DOB: January 07, 1951 DOA: 01/21/2023 PCP: Chevis Pretty, FNP     Brief Narrative:   [name] is an 72 y.o. female past medical history of generalized eye disorder, hyperlipidemia was in for ongoing shortness of breath cough wheezing and fever on 01/18/2023 she was seen in [place] ED CTA negative for PE and infection she was wheezing on exam and was treated for bronchitis symptoms treated for bronchospasms MDI and steroids after they have been discharged home she developed chills nausea vomiting weakness lightheadedness and shortness of breath with poor appetite return to the ED was found to have a CT chest positive for right lower lobe pneumonia satting in the 80s placed on 2 L of oxygen started empiric antibiotics   Assessment/Plan:   Acute respiratory failure with hypoxia secondary to right lower lobe pneumonia: She continues to require 2 L of oxygen to keep saturations greater than 88%. Blood cultures were sent, continue IV Rocephin and azithromycin. Continue inhalers. Out of bed to chair consult PT OT incentive spirometry and flutter valve. She relates her appetite has returned.  GAD (generalized anxiety disorder): Noted.  Hypokalemia: Repleted orally recheck in the morning    DVT prophylaxis: lovenox Family Communication:none Status is: Inpatient Remains inpatient appropriate because: Acute respiratory failure with hypoxia secondary to community-acquired pneumonia    Code Status:     Code Status Orders  (From admission, onward)           Start     Ordered   01/21/23 2218  Full code  Continuous       Question:  By:  Answer:  Consent: discussion documented in EHR   01/21/23 2219           Code Status History     This patient has a current code status but no historical code status.         IV Access:   Peripheral IV   Procedures and diagnostic studies:    CT Angio Chest PE W and/or Wo Contrast  Result Date: 01/21/2023 CLINICAL DATA:  Cough and shortness of breath. Back pain and chest pain. Nausea and vomiting. Headache. EXAM: CT ANGIOGRAPHY CHEST WITH CONTRAST TECHNIQUE: Multidetector CT imaging of the chest was performed using the standard protocol during bolus administration of intravenous contrast. Multiplanar CT image reconstructions and MIPs were obtained to evaluate the vascular anatomy. RADIATION DOSE REDUCTION: This exam was performed according to the departmental dose-optimization program which includes automated exposure control, adjustment of the mA and/or kV according to patient size and/or use of iterative reconstruction technique. CONTRAST:  55m OMNIPAQUE IOHEXOL 350 MG/ML SOLN COMPARISON:  01/18/2023 FINDINGS: Cardiovascular: Contrast bolus timing suboptimal but adequate to assess the pulmonary arteries. No filling defect is identified in the pulmonary arterial tree to suggest pulmonary embolus. Mild aortic arch atherosclerotic vascular disease. Left anterior descending coronary artery atherosclerosis. No acute vascular findings in the chest. Mediastinum/Nodes: Right paratracheal node 1.0 cm in short axis on image 34 series 4, previously 0.7 cm. AP window lymph node 0.7 cm in short axis on image 45 series 4, previously same. Subcarinal lymph node 1.4 cm in short axis on image 56 series 4, previously 0.8 cm. Right infrahilar lymph node 0.7 cm in short axis on image 67 series 4, previously 0.5 cm. Lungs/Pleura: Increase consolidation in the right lower lobe and lingula. Bandlike scarring/volume loss also noted in both regions. There is some hazy ground-glass opacity and  linear opacity in the left lower lobe which is new compared to the prior exam and may reflect early infection. Upper Abdomen: Unremarkable Musculoskeletal: Thoracic spondylosis. Review of the MIP images confirms the above findings. IMPRESSION: 1. No filling defect is identified in  the pulmonary arterial tree to suggest pulmonary embolus. 2. Increase in consolidation in the right lower lobe and lingula favoring multilobar pneumonia. There is also some new hazy ground-glass opacity and linear opacity in the left lower lobe which may reflect early infection. 3. Mild increase in size of right paratracheal, subcarinal, and right infrahilar lymph nodes likely reactive. 4. Left anterior descending coronary artery atherosclerosis. Aortic Atherosclerosis (ICD10-I70.0). Electronically Signed   By: Van Clines M.D.   On: 01/21/2023 16:44   DG Chest 2 View  Result Date: 01/21/2023 CLINICAL DATA:  Several day history of worsening cough and shortness of breath associated with chest pain, vomiting, and dizziness EXAM: CHEST - 2 VIEW COMPARISON:  Chest radiograph dated 01/18/2023 CT chest dated 01/18/2023 FINDINGS: Normal lung volumes. Curvilinear lingular opacity. Linear right lower lobe subsegmental atelectasis. No pleural effusion or pneumothorax. The heart size and mediastinal contours are within normal limits. The visualized skeletal structures are unremarkable. IMPRESSION: 1. Curvilinear lingular opacity may reflect atelectasis or early pneumonia. 2. Linear right lower lobe subsegmental atelectasis. Electronically Signed   By: Darrin Nipper M.D.   On: 01/21/2023 13:08     Medical Consultants:   None.   Subjective:    [name] no complaints.  Objective:    Vitals:   01/21/23 1800 01/21/23 2000 01/21/23 2129 01/22/23 0320  BP: (!) 142/60 121/67 (!) 128/54 (!) 113/53  Pulse: (!) 112 (!) 108 100 86  Resp: '16 18 18 18  '$ Temp:   98.6 F (37 C)   TempSrc:   Oral   SpO2: 93% 92% 96% 96%  Weight:      Height:       SpO2: 96 %   Intake/Output Summary (Last 24 hours) at 01/22/2023 0719 Last data filed at 01/21/2023 2011 Gross per 24 hour  Intake 1350 ml  Output --  Net 1350 ml   Filed Weights   01/21/23 1239  Weight: 81.6 kg    Exam: General exam: In no acute  distress. Respiratory system: Good air movement and clear to auscultation. Cardiovascular system: S1 & S2 heard, RRR. No JVD. Gastrointestinal system: Abdomen is nondistended, soft and nontender.  Extremities: No pedal edema. Skin: No rashes, lesions or ulcers Psychiatry: Judgement and insight appear normal. Mood & affect appropriate.    Data Reviewed:    Labs: Basic Metabolic Panel: Recent Labs  Lab 01/18/23 2141 01/21/23 1245 01/21/23 2253 01/22/23 0145  NA 139 136  --  137  K 3.3* 3.6  --  3.1*  CL 105 101  --  104  CO2 24 23  --  22  GLUCOSE 105* 105*  --  106*  BUN 12 10  --  7*  CREATININE 0.85 0.70 0.88 0.74  CALCIUM 9.2 9.3  --  8.1*  MG  --   --   --  2.1   GFR Estimated Creatinine Clearance: 68.6 mL/min (by C-G formula based on SCr of 0.74 mg/dL). Liver Function Tests: Recent Labs  Lab 01/18/23 2141  AST 32  ALT 20  ALKPHOS 107  BILITOT 0.4  PROT 6.9  ALBUMIN 4.0   No results for input(s): "LIPASE", "AMYLASE" in the last 168 hours. No results for input(s): "AMMONIA" in the last 168  hours. Coagulation profile No results for input(s): "INR", "PROTIME" in the last 168 hours. COVID-19 Labs  No results for input(s): "DDIMER", "FERRITIN", "LDH", "CRP" in the last 72 hours.  Lab Results  Component Value Date   SARSCOV2NAA NEGATIVE 01/18/2023   [place] Not Detected 10/14/2019    CBC: Recent Labs  Lab 01/18/23 2141 01/21/23 1245 01/21/23 2253 01/22/23 0145  WBC 6.4 9.6 7.7 7.5  NEUTROABS 3.3  --   --  4.7  HGB 12.5 13.4 11.2* 11.2*  HCT 38.4 40.3 34.0* 33.6*  MCV 89.7 89.2 90.7 89.4  PLT 189 200 160 152   Cardiac Enzymes: No results for input(s): "CKTOTAL", "CKMB", "CKMBINDEX", "TROPONINI" in the last 168 hours. BNP (last 3 results) No results for input(s): "PROBNP" in the last 8760 hours. CBG: No results for input(s): "GLUCAP" in the last 168 hours. D-Dimer: No results for input(s): "DDIMER" in the last 72 hours. Hgb A1c: No  results for input(s): "HGBA1C" in the last 72 hours. Lipid Profile: No results for input(s): "CHOL", "HDL", "LDLCALC", "TRIG", "CHOLHDL", "LDLDIRECT" in the last 72 hours. Thyroid function studies: No results for input(s): "TSH", "T4TOTAL", "T3FREE", "THYROIDAB" in the last 72 hours.  Invalid input(s): "FREET3" Anemia work up: No results for input(s): "VITAMINB12", "FOLATE", "FERRITIN", "TIBC", "IRON", "RETICCTPCT" in the last 72 hours. Sepsis Labs: Recent Labs  Lab 01/18/23 2141 01/21/23 1245 01/21/23 2253 01/22/23 0145  PROCALCITON  --   --  0.17  --   WBC 6.4 9.6 7.7 7.5   Microbiology Recent Results (from the past 240 hour(s))  Resp panel by RT-PCR (RSV, Flu A&B, Covid) Anterior Nasal Swab     Status: None   Collection Time: 01/18/23  8:37 PM   Specimen: Anterior Nasal Swab  Result Value Ref Range Status   SARS Coronavirus 2 by RT PCR NEGATIVE NEGATIVE Final    Comment: (NOTE) SARS-CoV-2 target nucleic acids are NOT DETECTED.  The SARS-CoV-2 RNA is generally detectable in upper respiratory specimens during the acute phase of infection. The lowest concentration of SARS-CoV-2 viral copies this assay can detect is 138 copies/mL. A negative result does not preclude SARS-Cov-2 infection and should not be used as the sole basis for treatment or other patient management decisions. A negative result may occur with  improper specimen collection/handling, submission of specimen other than nasopharyngeal swab, presence of viral mutation(s) within the areas targeted by this assay, and inadequate number of viral copies(<138 copies/mL). A negative result must be combined with clinical observations, patient history, and epidemiological information. The expected result is Negative.  Fact Sheet for Patients:  EntrepreneurPulse.com.au  Fact Sheet for Healthcare Providers:  IncredibleEmployment.be  This test is no t yet approved or cleared by the  [place] FDA and  has been authorized for detection and/or diagnosis of SARS-CoV-2 by FDA under an Emergency Use Authorization (EUA). This EUA will remain  in effect (meaning this test can be used) for the duration of the COVID-19 declaration under Section 564(b)(1) of the Act, 21 U.S.C.section 360bbb-3(b)(1), unless the authorization is terminated  or revoked sooner.       Influenza A by PCR NEGATIVE NEGATIVE Final   Influenza B by PCR NEGATIVE NEGATIVE Final    Comment: (NOTE) The Xpert Xpress SARS-CoV-2/FLU/RSV plus assay is intended as an aid in the diagnosis of influenza from Nasopharyngeal swab specimens and should not be used as a sole basis for treatment. Nasal washings and aspirates are unacceptable for Xpert Xpress SARS-CoV-2/FLU/RSV testing.  Fact Sheet for Patients: EntrepreneurPulse.com.au  Fact Sheet for Healthcare Providers: IncredibleEmployment.be  This test is not yet approved or cleared by the [place] FDA and has been authorized for detection and/or diagnosis of SARS-CoV-2 by FDA under an Emergency Use Authorization (EUA). This EUA will remain in effect (meaning this test can be used) for the duration of the COVID-19 declaration under Section 564(b)(1) of the Act, 21 U.S.C. section 360bbb-3(b)(1), unless the authorization is terminated or revoked.     Resp Syncytial Virus by PCR NEGATIVE NEGATIVE Final    Comment: (NOTE) Fact Sheet for Patients: EntrepreneurPulse.com.au  Fact Sheet for Healthcare Providers: IncredibleEmployment.be  This test is not yet approved or cleared by the [place] FDA and has been authorized for detection and/or diagnosis of SARS-CoV-2 by FDA under an Emergency Use Authorization (EUA). This EUA will remain in effect (meaning this test can be used) for the duration of the COVID-19 declaration under Section 564(b)(1) of the Act, 21 U.S.C. section  360bbb-3(b)(1), unless the authorization is terminated or revoked.  Performed at [facility], [place], [place] [address]      Medications:    enoxaparin (LOVENOX) injection  40 mg Subcutaneous Q24H   escitalopram  20 mg Oral Daily   guaiFENesin  1,200 mg Oral BID   Continuous Infusions:  azithromycin     cefTRIAXone (ROCEPHIN)  IV        LOS: 1 day   Charlynne Cousins  [facility]  01/22/2023, 7:19 AM

## 2023-01-22 NOTE — Evaluation (Signed)
Physical Therapy Evaluation Patient Details Name: [name] MRN: [id number] DOB: 08-Jun-1951 Today's Date: 01/22/2023  History of Present Illness  72 y.o. female [facility] to [facility] hospital on [facility]23 with SOB, cough, wheezing and fever. Chest CT positive for RLL PNA. PMH includes GAD, HLD.  Clinical Impression  Pt presents to PT with mild deficits in endurance and cardiopulmonary function. Pt is able to ambulate for household distances without assistance. PT provides incentive spirometer, pt pulling 1250m at this time, frequently coughing with deep inspiration. PT encourage frequent ambulation and incentive spirometry use. PT anticipates no PT or DME needs at the time of discharge.       Recommendations for follow up therapy are one component of a multi-disciplinary discharge planning process, led by the attending physician.  Recommendations may be updated based on patient status, additional functional criteria and insurance authorization.  Follow Up Recommendations No PT follow up      Assistance Recommended at Discharge None  Patient can return home with the following       Equipment Recommendations None recommended by PT  Recommendations for Other Services       Functional Status Assessment Patient has had a recent decline in their functional status and demonstrates the ability to make significant improvements in function in a reasonable and predictable amount of time.     Precautions / Restrictions Precautions Precautions: None Restrictions Weight Bearing Restrictions: No      Mobility  Bed Mobility Overal bed mobility: Modified Independent                  Transfers Overall transfer level: Independent                      Ambulation/Gait Ambulation/Gait assistance: Supervision Gait Distance (Feet): 300 Feet Assistive device: None Gait Pattern/deviations: Step-through pattern Gait velocity: functional Gait velocity interpretation: >2.62 ft/sec,  indicative of community ambulatory   General Gait Details: slow but steady step-through gait  Stairs            Wheelchair Mobility    Modified Rankin (Stroke Patients Only)       Balance Overall balance assessment: No apparent balance deficits (not formally assessed)                                           Pertinent Vitals/Pain Pain Assessment Pain Assessment: No/denies pain    Home Living Family/patient expects to be discharged to:: Private residence Living Arrangements: Spouse/significant other Available Help at Discharge: Family;Available 24 hours/day Type of Home: House Home Access: Stairs to enter Entrance Stairs-Rails: None Entrance Stairs-Number of Steps: 2   Home Layout: One level Home Equipment: None      Prior Function Prior Level of Function : Independent/Modified Independent;Driving             Mobility Comments: babysits her 5 grandchildren during the week, 872 monthsold to 70years old       Hand Dominance        Extremity/Trunk Assessment   Upper Extremity Assessment Upper Extremity Assessment: Overall WFL for tasks assessed    Lower Extremity Assessment Lower Extremity Assessment: Overall WFL for tasks assessed    Cervical / Trunk Assessment Cervical / Trunk Assessment: Normal  Communication   Communication: No difficulties  Cognition Arousal/Alertness: Awake/alert Behavior During Therapy: WFL for tasks assessed/performed Overall Cognitive Status: Within Functional  Limits for tasks assessed                                          General Comments General comments (skin integrity, edema, etc.): VSS on RA, sats from 91-96% when mobilizing on RA    Exercises     Assessment/Plan    PT Assessment Patient needs continued PT services  PT Problem List Decreased activity tolerance;Cardiopulmonary status limiting activity       PT Treatment Interventions Gait training;Stair  training;Patient/family education    PT Goals (Current goals can be found in the Care Plan section)  Acute Rehab PT Goals Patient Stated Goal: to return to independence, improve endurance PT Goal Formulation: With patient Time For Goal Achievement: 02/05/23 Potential to Achieve Goals: Good Additional Goals Additional Goal #1: Pt will score >19/24 on the DGI to indicate a reduced risk for falls Additional Goal #2: Pt will deny DOE or SOB when ambulating on room air for >400' to demonstrate improved activity tolerance    Frequency Min 2X/week     Co-evaluation               AM-PAC PT "6 Clicks" Mobility  Outcome Measure Help needed turning from your back to your side while in a flat bed without using bedrails?: None Help needed moving from lying on your back to sitting on the side of a flat bed without using bedrails?: None Help needed moving to and from a bed to a chair (including a wheelchair)?: None Help needed standing up from a chair using your arms (e.g., wheelchair or bedside chair)?: None Help needed to walk in hospital room?: A Little Help needed climbing 3-5 steps with a railing? : A Little 6 Click Score: 22    End of Session   Activity Tolerance: Patient tolerated treatment well Patient left: in bed;with call bell/phone within reach;with family/visitor present Nurse Communication: Mobility status PT Visit Diagnosis: Other abnormalities of gait and mobility (R26.89)    Time: [id number] PT Time Calculation (min) (ACUTE ONLY): 814 min   Charges:   PT Evaluation $PT Eval Low Complexity: [place], PT, DPT Acute Rehabilitation Office [phone]   Zenaida Niece 01/22/2023, 1:45 PM

## 2023-01-22 NOTE — Plan of Care (Addendum)
Patient had a good rest overnight. Able to walk to the bathroom with no distress, coughing spells intermittently but stated feeling a lot better.  Problem: Education: Goal: Knowledge of General Education information will improve Description: Including pain rating scale, medication(s)/side effects and non-pharmacologic comfort measures Outcome: Progressing   Problem: Health Behavior/Discharge Planning: Goal: Ability to manage health-related needs will improve Outcome: Progressing   Problem: Activity: Goal: Risk for activity intolerance will decrease Outcome: Progressing   Problem: Safety: Goal: Ability to remain free from injury will improve Outcome: Progressing

## 2023-01-23 LAB — POTASSIUM: Potassium: 4 mmol/L (ref 3.5–5.1)

## 2023-01-23 LAB — GLUCOSE, CAPILLARY: Glucose-Capillary: 98 mg/dL (ref 70–99)

## 2023-01-23 LAB — LEGIONELLA PNEUMOPHILA SEROGP 1 UR AG: L. pneumophila Serogp 1 Ur Ag: NEGATIVE

## 2023-01-23 MED ORDER — AZITHROMYCIN 250 MG PO TABS
500.0000 mg | ORAL_TABLET | Freq: Every day | ORAL | Status: DC
  Administered 2023-01-23 – 2023-01-24 (×2): 500 mg via ORAL
  Filled 2023-01-23 (×2): qty 2

## 2023-01-23 MED ORDER — POTASSIUM CHLORIDE CRYS ER 20 MEQ PO TBCR
40.0000 meq | EXTENDED_RELEASE_TABLET | Freq: Once | ORAL | Status: AC
  Administered 2023-01-23: 40 meq via ORAL
  Filled 2023-01-23: qty 2

## 2023-01-23 NOTE — Progress Notes (Addendum)
[facility] PROGRESS NOTE    Progress Note  KATTALEIA LATSHAW  [id number] DOB: [birth year] DOA: 01/21/2023 PCP: Chevis Pretty, FNP     Brief Narrative:   [name] is an 72 y.o. female past medical history of generalized eye disorder, hyperlipidemia was in for ongoing shortness of breath cough wheezing and fever on 01/18/2023 she was seen in [place] [facility] CTA negative for PE and infection she was wheezing on exam and was treated for bronchitis symptoms treated for bronchospasms MDI and steroids after they have been discharged home she developed chills nausea vomiting weakness lightheadedness and shortness of breath with poor appetite return to the ED was found to have a CT chest positive for right lower lobe pneumonia satting in the 80s placed on 2 L of oxygen started empiric antibiotics  01/23/2023: The patient was seen and examined at bedside.  States she still feels pretty weak.  Home O2 evaluation completed.  Will plan to discharge tomorrow.   Assessment/Plan:   Resolved: Acute respiratory failure with hypoxia secondary to right lower lobe pneumonia: Now on room air.  She passed her home O2 evaluation. Blood cultures were sent, continue IV Rocephin and azithromycin. Continue inhalers. Continue out of bed to chair consult PT OT incentive spirometry and flutter valve. Mobilize with assistance.  GAD (generalized anxiety disorder): Noted.  Hypokalemia: Repleted, recheck potassium level.  History of recurrent falls Fall precautions PT assessed and had no further recommendations.  Obesity BMI 30 Recommend weight loss outpatient with regular physical activity and healthy dieting.  DVT prophylaxis: lovenox Family Communication:none Status is: Inpatient Remains inpatient appropriate because: Acute respiratory failure with hypoxia secondary to community-acquired pneumonia    Code Status:     Code Status Orders  (From admission, onward)           Start      Ordered   01/21/23 2218  Full code  Continuous       Question:  By:  Answer:  Consent: discussion documented in EHR   01/21/23 2219           Code Status History     This patient has a current code status but no historical code status.         IV Access:   Peripheral IV   Procedures and diagnostic studies:   CT Angio Chest PE W and/or Wo Contrast  Result Date: 01/21/2023 CLINICAL DATA:  Cough and shortness of breath. Back pain and chest pain. Nausea and vomiting. Headache. EXAM: CT ANGIOGRAPHY CHEST WITH CONTRAST TECHNIQUE: Multidetector CT imaging of the chest was performed using the standard protocol during bolus administration of intravenous contrast. Multiplanar CT image reconstructions and MIPs were obtained to evaluate the vascular anatomy. RADIATION DOSE REDUCTION: This exam was performed according to the departmental dose-optimization program which includes automated exposure control, adjustment of the mA and/or kV according to patient size and/or use of iterative reconstruction technique. CONTRAST:  66m OMNIPAQUE IOHEXOL 350 MG/ML SOLN COMPARISON:  01/18/2023 FINDINGS: Cardiovascular: Contrast bolus timing suboptimal but adequate to assess the pulmonary arteries. No filling defect is identified in the pulmonary arterial tree to suggest pulmonary embolus. Mild aortic arch atherosclerotic vascular disease. Left anterior descending coronary artery atherosclerosis. No acute vascular findings in the chest. Mediastinum/Nodes: Right paratracheal node 1.0 cm in short axis on image 34 series 4, previously 0.7 cm. AP window lymph node 0.7 cm in short axis on image 45 series 4, previously same. Subcarinal lymph node 1.4 cm in short axis on  image 56 series 4, previously 0.8 cm. Right infrahilar lymph node 0.7 cm in short axis on image 67 series 4, previously 0.5 cm. Lungs/Pleura: Increase consolidation in the right lower lobe and lingula. Bandlike scarring/volume loss also noted in both  regions. There is some hazy ground-glass opacity and linear opacity in the left lower lobe which is new compared to the prior exam and may reflect early infection. Upper Abdomen: Unremarkable Musculoskeletal: Thoracic spondylosis. Review of the MIP images confirms the above findings. IMPRESSION: 1. No filling defect is identified in the pulmonary arterial tree to suggest pulmonary embolus. 2. Increase in consolidation in the right lower lobe and lingula favoring multilobar pneumonia. There is also some new hazy ground-glass opacity and linear opacity in the left lower lobe which may reflect early infection. 3. Mild increase in size of right paratracheal, subcarinal, and right infrahilar lymph nodes likely reactive. 4. Left anterior descending coronary artery atherosclerosis. Aortic Atherosclerosis (ICD10-I70.0). Electronically Signed   By: Van Clines M.D.   On: 01/21/2023 16:44     Medical Consultants:   None.   Objective:    Vitals:   01/22/23 1949 01/23/23 0527 01/23/23 0815 01/23/23 1616  BP: (!) 117/59 (!) 109/59 (!) 118/57 131/70  Pulse: 85 76 79 81  Resp: '15 16 18 17  '$ Temp: 97.8 F (36.6 C) 98.3 F (36.8 C)  98.3 F (36.8 C)  TempSrc: Oral Oral    SpO2: 95% 92% 94% 97%  Weight:      Height:       SpO2: 97 %   Intake/Output Summary (Last 24 hours) at 01/23/2023 1618 Last data filed at 01/22/2023 2336 Gross per 24 hour  Intake 350.02 ml  Output --  Net 350.02 ml   Filed Weights   01/21/23 1239  Weight: 81.6 kg    Exam: General exam: Well-developed well-nourished in no acute distress.  She is alert oriented x 3. Respiratory system: Clear to auscultation no wheezes or rales. Cardiovascular system: Regular rate and rhythm no rubs or gallops. Gastrointestinal system: Abdomen is nondistended, soft and nontender.  Extremities: No pedal edema. Skin: No rashes, lesions or ulcers Psychiatry: Mood is appropriate for condition setting.  Data Reviewed:     Labs: Basic Metabolic Panel: Recent Labs  Lab 01/18/23 2141 01/21/23 1245 01/21/23 2253 01/22/23 0145  NA 139 136  --  137  K 3.3* 3.6  --  3.1*  CL 105 101  --  104  CO2 24 23  --  22  GLUCOSE 105* 105*  --  106*  BUN 12 10  --  7*  CREATININE 0.85 0.70 0.88 0.74  CALCIUM 9.2 9.3  --  8.1*  MG  --   --   --  2.1   GFR Estimated Creatinine Clearance: 68.6 mL/min (by C-G formula based on SCr of 0.74 mg/dL). Liver Function Tests: Recent Labs  Lab 01/18/23 2141  AST 32  ALT 20  ALKPHOS 107  BILITOT 0.4  PROT 6.9  ALBUMIN 4.0   No results for input(s): "LIPASE", "AMYLASE" in the last 168 hours. No results for input(s): "AMMONIA" in the last 168 hours. Coagulation profile No results for input(s): "INR", "PROTIME" in the last 168 hours. COVID-19 Labs  No results for input(s): "DDIMER", "FERRITIN", "LDH", "CRP" in the last 72 hours.  Lab Results  Component Value Date   [place] NEGATIVE 01/18/2023   [place] Not Detected 10/14/2019    CBC: Recent Labs  Lab 01/18/23 2141 01/21/23 1245 01/21/23 2253 01/22/23  0145  WBC 6.4 9.6 7.7 7.5  NEUTROABS 3.3  --   --  4.7  HGB 12.5 13.4 11.2* 11.2*  HCT 38.4 40.3 34.0* 33.6*  MCV 89.7 89.2 90.7 89.4  PLT 189 200 160 152   Cardiac Enzymes: No results for input(s): "CKTOTAL", "CKMB", "CKMBINDEX", "TROPONINI" in the last 168 hours. BNP (last 3 results) No results for input(s): "PROBNP" in the last 8760 hours. CBG: Recent Labs  Lab 01/23/23 1614  GLUCAP 98   D-Dimer: No results for input(s): "DDIMER" in the last 72 hours. Hgb A1c: No results for input(s): "HGBA1C" in the last 72 hours. Lipid Profile: No results for input(s): "CHOL", "HDL", "LDLCALC", "TRIG", "CHOLHDL", "LDLDIRECT" in the last 72 hours. Thyroid function studies: No results for input(s): "TSH", "T4TOTAL", "T3FREE", "THYROIDAB" in the last 72 hours.  Invalid input(s): "FREET3" Anemia work up: No results for input(s): "VITAMINB12",  "FOLATE", "FERRITIN", "TIBC", "IRON", "RETICCTPCT" in the last 72 hours. Sepsis Labs: Recent Labs  Lab 01/18/23 2141 01/21/23 1245 01/21/23 2253 01/22/23 0145  PROCALCITON  --   --  0.17  --   WBC 6.4 9.6 7.7 7.5   Microbiology Recent Results (from the past 240 hour(s))  Resp panel by RT-PCR (RSV, Flu A&B, Covid) Anterior Nasal Swab     Status: None   Collection Time: 01/18/23  8:37 PM   Specimen: Anterior Nasal Swab  Result Value Ref Range Status   SARS Coronavirus 2 by RT PCR NEGATIVE NEGATIVE Final    Comment: (NOTE) SARS-CoV-2 target nucleic acids are NOT DETECTED.  The SARS-CoV-2 RNA is generally detectable in upper respiratory specimens during the acute phase of infection. The lowest concentration of SARS-CoV-2 viral copies this assay can detect is 138 copies/mL. A negative result does not preclude SARS-Cov-2 infection and should not be used as the sole basis for treatment or other patient management decisions. A negative result may occur with  improper specimen collection/handling, submission of specimen other than nasopharyngeal swab, presence of viral mutation(s) within the areas targeted by this assay, and inadequate number of viral copies(<138 copies/mL). A negative result must be combined with clinical observations, patient history, and epidemiological information. The expected result is Negative.  Fact Sheet for Patients:  EntrepreneurPulse.com.au  Fact Sheet for Healthcare Providers:  IncredibleEmployment.be  This test is no t yet approved or cleared by the [place] FDA and  has been authorized for detection and/or diagnosis of SARS-CoV-2 by FDA under an Emergency Use Authorization (EUA). This EUA will remain  in effect (meaning this test can be used) for the duration of the COVID-19 declaration under Section 564(b)(1) of the Act, 21 U.S.C.section 360bbb-3(b)(1), unless the authorization is terminated  or revoked  sooner.       Influenza A by PCR NEGATIVE NEGATIVE Final   Influenza B by PCR NEGATIVE NEGATIVE Final    Comment: (NOTE) The Xpert Xpress SARS-CoV-2/FLU/RSV plus assay is intended as an aid in the diagnosis of influenza from Nasopharyngeal swab specimens and should not be used as a sole basis for treatment. Nasal washings and aspirates are unacceptable for Xpert Xpress SARS-CoV-2/FLU/RSV testing.  Fact Sheet for Patients: EntrepreneurPulse.com.au  Fact Sheet for Healthcare Providers: IncredibleEmployment.be  This test is not yet approved or cleared by the [place] FDA and has been authorized for detection and/or diagnosis of SARS-CoV-2 by FDA under an Emergency Use Authorization (EUA). This EUA will remain in effect (meaning this test can be used) for the duration of the COVID-19 declaration under Section 564(b)(1) of  the Act, 21 U.S.C. section 360bbb-3(b)(1), unless the authorization is terminated or revoked.     Resp Syncytial Virus by PCR NEGATIVE NEGATIVE Final    Comment: (NOTE) Fact Sheet for Patients: EntrepreneurPulse.com.au  Fact Sheet for Healthcare Providers: IncredibleEmployment.be  This test is not yet approved or cleared by the [place] FDA and has been authorized for detection and/or diagnosis of SARS-CoV-2 by FDA under an Emergency Use Authorization (EUA). This EUA will remain in effect (meaning this test can be used) for the duration of the COVID-19 declaration under Section 564(b)(1) of the Act, 21 U.S.C. section 360bbb-3(b)(1), unless the authorization is terminated or revoked.  Performed at [facility], [address], [place], [place] [address]   Culture, blood (Routine X 2) w Reflex to ID Panel     Status: None (Preliminary result)   Collection Time: 01/21/23 10:53 PM   Specimen: BLOOD  Result Value Ref Range Status   Specimen Description BLOOD  RIGHT ANTECUBITAL  Final   Special Requests   Final    BOTTLES DRAWN AEROBIC AND ANAEROBIC Blood Culture results may not be optimal due to an inadequate volume of blood received in culture bottles   Culture   Final    NO GROWTH 2 DAYS Performed at [facility], [place] [address], [place], [place] [address]    Report Status PENDING  Incomplete  Culture, blood (Routine X 2) w Reflex to ID Panel     Status: None (Preliminary result)   Collection Time: 01/21/23 11:01 PM   Specimen: BLOOD  Result Value Ref Range Status   Specimen Description BLOOD BLOOD RIGHT HAND  Final   Special Requests   Final    BOTTLES DRAWN AEROBIC AND ANAEROBIC Blood Culture results may not be optimal due to an inadequate volume of blood received in culture bottles   Culture   Final    NO GROWTH 2 DAYS Performed at [facility], [place] [address] [place], [place] [address]    Report Status PENDING  Incomplete     Medications:    azithromycin  500 mg Oral Daily   enoxaparin (LOVENOX) injection  40 mg Subcutaneous Q24H   escitalopram  20 mg Oral Daily   guaiFENesin  1,200 mg Oral BID   Continuous Infusions:  cefTRIAXone (ROCEPHIN)  IV Stopped (01/22/23 1836)      LOS: 2 days   Kayleen Memos  [facility]  01/23/2023, 4:18 PM

## 2023-01-23 NOTE — Progress Notes (Signed)
Mobility Specialist Progress Note    01/23/23 1425  Mobility  Activity Ambulated independently in hallway  Level of Assistance Standby assist, set-up cues, supervision of patient - no hands on  Assistive Device None  Distance Ambulated (ft) 500 ft  Activity Response Tolerated well  Mobility Referral Yes  $Mobility charge 1 Mobility   Pre-Mobility: 94% SpO2 During Mobility: 96% SpO2 Post-Mobility: 95% SpO2  Pt received in bed and agreeable. No complaints on walk. Tolerated on RA. Returned to sitting EOB with call bell in reach. RN aware.   Hildred Alamin Mobility Specialist  Please Psychologist, sport and exercise or Rehab Office at [phone]

## 2023-01-23 NOTE — Progress Notes (Signed)
Nurse requested Mobility Specialist to perform oxygen saturation test with pt which includes removing pt from oxygen both at rest and while ambulating.  Below are the results from that testing.     Patient Saturations on Room Air at Rest = spO2 94%  Patient Saturations on Room Air while Ambulating = sp02 96% .   At end of testing pt left in room on RA.   Reported results to nurse.

## 2023-01-24 LAB — LEGIONELLA PNEUMOPHILA SEROGP 1 UR AG: L. pneumophila Serogp 1 Ur Ag: NEGATIVE

## 2023-01-24 LAB — MYCOPLASMA PNEUMONIAE ANTIBODY, IGM: Mycoplasma pneumo IgM: <770 U/mL (ref 0–769)

## 2023-01-24 MED ORDER — BENZONATATE 100 MG PO CAPS: 100.0000 mg | ORAL_CAPSULE | Freq: Three times a day (TID) | ORAL | 0 refills | Status: DC | PRN

## 2023-01-24 MED ORDER — CEFDINIR 300 MG PO CAPS
300.0000 mg | ORAL_CAPSULE | Freq: Two times a day (BID) | ORAL | Status: DC
  Filled 2023-01-24 (×2): qty 1

## 2023-01-24 MED ORDER — AZITHROMYCIN 500 MG PO TABS: ORAL_TABLET | ORAL | 0 refills | Status: AC

## 2023-01-24 MED ORDER — CEFDINIR 300 MG PO CAPS: 300.0000 mg | ORAL_CAPSULE | Freq: Two times a day (BID) | ORAL | 0 refills | Status: AC

## 2023-01-24 NOTE — Progress Notes (Signed)
Physical Therapy Treatment and Discharge Patient Details Name: [name] MRN: [id number] DOB: May 29, 1951 Today's Date: 01/24/2023   History of Present Illness 72 y.o. female presents to [facility] hospital on 01/21/2023 with SOB, cough, wheezing and fever. Chest CT positive for RLL PNA. PMH includes GAD, HLD.    PT Comments    Goals met/ adequately for d/c. Ambulates without assistive device, no DOE or SOB reported SpO2 94% and greater throughout session on RA. Tolerated higher level dynamic gait challenges and scored >19 on DGI indicating low fall risk. Declines stairs, feels confident with current ability. Encouraged continued use of IS and FV. All questions answered. Pt eager to return home. Very active at baseline. No further recs from PT. D/c from PT services.   Recommendations for follow up therapy are one component of a multi-disciplinary discharge planning process, led by the attending physician.  Recommendations may be updated based on patient status, additional functional criteria and insurance authorization.  Follow Up Recommendations  No PT follow up     Assistance Recommended at Discharge None  Patient can return home with the following     Equipment Recommendations  None recommended by PT    Recommendations for Other Services       Precautions / Restrictions Precautions Precautions: None Restrictions Weight Bearing Restrictions: No     Mobility  Bed Mobility Overal bed mobility: Modified Independent                  Transfers Overall transfer level: Independent                      Ambulation/Gait Ambulation/Gait assistance: Modified independent (Device/Increase time) Gait Distance (Feet): 415 Feet Assistive device: None Gait Pattern/deviations: Step-through pattern Gait velocity: WFL Gait velocity interpretation: >2.62 ft/sec, indicative of community ambulatory   General Gait Details: Slightly decreased gait speed with minor guarding.  Challenged and assessed dynamic gait which was well tolerated with minimal deviation from straight path (see DGI.) SpO2 94% on RA. HR 105   Stairs Stairs:  (declines)           Wheelchair Mobility    Modified Rankin (Stroke Patients Only)       Balance Overall balance assessment: Modified Independent                               Standardized Balance Assessment Standardized Balance Assessment : Dynamic Gait Index   Dynamic Gait Index Level Surface: Normal Change in Gait Speed: Normal Gait with Horizontal Head Turns: Mild Impairment Gait with Vertical Head Turns: Normal Gait and Pivot Turn: Normal Step Over Obstacle: Mild Impairment Step Around Obstacles: Normal Steps: Mild Impairment Total Score: 21      Cognition Arousal/Alertness: Awake/alert Behavior During Therapy: WFL for tasks assessed/performed Overall Cognitive Status: Within Functional Limits for tasks assessed                                          Exercises      General Comments General comments (skin integrity, edema, etc.): 94% SPO2 on RA, no dyspnea, denies SOB. Reviewed importance of IS and FV use.      Pertinent Vitals/Pain Pain Assessment Pain Assessment: No/denies pain    Home Living  Prior Function            PT Goals (current goals can now be found in the care plan section) Acute Rehab PT Goals Patient Stated Goal: to return to independence, improve endurance PT Goal Formulation: With patient Time For Goal Achievement: 02/05/23 Potential to Achieve Goals: Good Progress towards PT goals: Goals met/education completed, patient discharged from PT    Frequency    Min 2X/week      PT Plan Current plan remains appropriate    Co-evaluation              AM-PAC PT "6 Clicks" Mobility   Outcome Measure  Help needed turning from your back to your side while in a flat bed without using bedrails?:  None Help needed moving from lying on your back to sitting on the side of a flat bed without using bedrails?: None Help needed moving to and from a bed to a chair (including a wheelchair)?: None Help needed standing up from a chair using your arms (e.g., wheelchair or bedside chair)?: None Help needed to walk in hospital room?: None Help needed climbing 3-5 steps with a railing? : A Little 6 Click Score: 23    End of Session   Activity Tolerance: Patient tolerated treatment well Patient left: in bed;with call bell/phone within reach Nurse Communication: Mobility status PT Visit Diagnosis: Other abnormalities of gait and mobility (R26.89)     Time: 0950-1000 PT Time Calculation (min) (ACUTE ONLY): 10 min  Charges:  $Therapeutic Activity: 8-22 mins                     Candie Mile, PT, DPT Physical Therapist Acute Rehabilitation Services [place] 01/24/2023, 10:14 AM

## 2023-01-24 NOTE — Discharge Summary (Signed)
Discharge Summary  TAMMRA DOBISH [id number] DOB: 1951/07/21  PCP: Chevis Pretty, FNP  Admit date: 01/21/2023 Discharge date: 01/24/2023  Time spent: 35 minutes.  Recommendations for Outpatient Follow-up:  Please follow-up with your primary care provider.  Discharge Diagnoses:  Active Hospital Problems   Diagnosis Date Noted   Pneumonia 01/21/2023   Acute respiratory failure with hypoxia (HCC) 01/22/2023   GAD (generalized anxiety disorder) 05/21/2019    Resolved Hospital Problems  No resolved problems to display.    Discharge Condition: Stable  Diet recommendation: Resume previous diet.  Vitals:   01/23/23 2115 01/24/23 0751  BP: (!) 140/58 136/72  Pulse: 82 78  Resp:  17  Temp: 98.6 F (37 C) 97.6 F (36.4 C)  SpO2: 97% 97%    History of present illness:   YAIRETH JOPLIN is an 72 y.o. female past medical history of generalized eye disorder, hyperlipidemia was in for ongoing shortness of breath cough wheezing and fever on 01/18/2023 she was seen in [facility] CT[facility] negative for PE and infection she was wheezing on exam and was treated for bronchitis symptoms treated for bronchospasms MDI and steroids after they have been discharged home she developed chills nausea vomiting weakness lightheadedness and shortness of breath with poor appetite return to the ED was found to have a CT chest positive for right lower lobe pneumonia satting in the 80s placed on 2 L of oxygen started empiric antibiotics.  Hypoxia resolved.  She passed her home O2 evaluation.  No longer requiring oxygen supplementation.  Assessed by PT with no further recommendations.   01/24/2023: The patient was seen and examined at bedside.  There were no acute events overnight.  She has no new complaints and is eager to go home.  Hospital Course:  Principal Problem:   Pneumonia Active Problems:   GAD (generalized anxiety disorder)   Acute respiratory failure with hypoxia (HCC)  Resolved: Acute  respiratory failure with hypoxia secondary to right lower lobe pneumonia: Passed home O2 evaluation.  No longer requiring oxygen supplementation.   GAD (generalized anxiety disorder): Stable Resume home regimen.   Resolved, post repletion, hypokalemia: Serum potassium 4.0. Serum magnesium 2.1.   History of recurrent falls Continue fall precautions PT assessed and had no further recommendations.   Obesity BMI 30 Recommend weight loss outpatient with regular physical activity and healthy dieting.     Discharge Exam: BP 136/72 (BP Location: Right Arm)   Pulse 78   Temp 97.6 F (36.4 C) (Oral)   Resp 17   Ht '5\' 4"'$  (1.626 m)   Wt 81.6 kg   SpO2 97%   BMI 30.88 kg/m  General: 72 y.o. year-old female well developed well nourished in no acute distress.  Alert and oriented x3. Cardiovascular: Regular rate and rhythm with no rubs or gallops.  No thyromegaly or JVD noted.   Respiratory: Clear to auscultation with no wheezes or rales. Good inspiratory effort. Abdomen: Soft nontender nondistended with normal bowel sounds x4 quadrants. Musculoskeletal: No lower extremity edema. 2/4 pulses in all 4 extremities. Skin: No ulcerative lesions noted or rashes, Psychiatry: Mood is appropriate for condition and setting  Discharge Instructions You were cared for by a hospitalist during your hospital stay. If you have any questions about your discharge medications or the care you received while you were in the hospital after you are discharged, you can call the unit and asked to speak with the hospitalist on call if the hospitalist that took care of you is not  available. Once you are discharged, your primary care physician will handle any further medical issues. Please note that NO REFILLS for any discharge medications will be authorized once you are discharged, as it is imperative that you return to your primary care physician (or establish a relationship with a primary care physician if you do not  have one) for your aftercare needs so that they can reassess your need for medications and monitor your lab values.   Allergies as of 01/24/2023       Reactions   Bee Venom Swelling   Rosuvastatin Other (See Comments)   myopathy   Lipitor [atorvastatin] Other (See Comments)   myopathy        Medication List     STOP taking these medications    methylPREDNISolone 4 MG Tbpk tablet Commonly known as: MEDROL DOSEPAK       TAKE these medications    albuterol 108 (90 Base) MCG/ACT inhaler Commonly known as: VENTOLIN HFA Inhale 2 puffs into the lungs every 4 (four) hours as needed for wheezing or shortness of breath.   ALPRAZolam 0.5 MG tablet Commonly known as: Xanax Take 1 tablet (0.5 mg total) by mouth 2 (two) times daily as needed for anxiety.   azithromycin 500 MG tablet Commonly known as: ZITHROMAX Take 1 tablet daily x 3 days   benzonatate 100 MG capsule Commonly known as: TESSALON Take 1 capsule (100 mg total) by mouth 3 (three) times daily as needed for cough.   cefdinir 300 MG capsule Commonly known as: OMNICEF Take 1 capsule (300 mg total) by mouth every 12 (twelve) hours for 3 days.   escitalopram 20 MG tablet Commonly known as: Lexapro Take 1 tablet (20 mg total) by mouth daily.   meclizine 25 MG tablet Commonly known as: ANTIVERT Take 1 tablet (25 mg total) by mouth 3 (three) times daily as needed for dizziness.   Repatha SureClick [id number] MG/ML Soaj Generic drug: Evolocumab Inject 150 mg into the skin every 14 (fourteen) days.   SYSTANE FREE OP Place 1 drop into both eyes daily as needed (dry eyes).       Allergies  Allergen Reactions   Bee Venom Swelling   Rosuvastatin Other (See Comments)    myopathy   Lipitor [Atorvastatin] Other (See Comments)    myopathy    Follow-up Information     Chevis Pretty, FNP Follow up.   Specialty: Family Medicine Contact information: [place] [address] [phone]                   The results of significant diagnostics from this hospitalization (including imaging, microbiology, ancillary and laboratory) are listed below for reference.    Significant Diagnostic Studies: CT Angio Chest PE W and/or Wo Contrast  Result Date: 01/21/2023 CLINICAL DATA:  Cough and shortness of breath. Back pain and chest pain. Nausea and vomiting. Headache. EXAM: CT ANGIOGRAPHY CHEST WITH CONTRAST TECHNIQUE: Multidetector CT imaging of the chest was performed using the standard protocol during bolus administration of intravenous contrast. Multiplanar CT image reconstructions and MIPs were obtained to evaluate the vascular anatomy. RADIATION DOSE REDUCTION: This exam was performed according to the departmental dose-optimization program which includes automated exposure control, adjustment of the mA and/or kV according to patient size and/or use of iterative reconstruction technique. CONTRAST:  74m OMNIPAQUE IOHEXOL 350 MG/ML SOLN COMPARISON:  01/18/2023 FINDINGS: Cardiovascular: Contrast bolus timing suboptimal but adequate to assess the pulmonary arteries. No filling defect is identified in the  pulmonary arterial tree to suggest pulmonary embolus. Mild aortic arch atherosclerotic vascular disease. Left anterior descending coronary artery atherosclerosis. No acute vascular findings in the chest. Mediastinum/Nodes: Right paratracheal node 1.0 cm in short axis on image 34 series 4, previously 0.7 cm. AP window lymph node 0.7 cm in short axis on image 45 series 4, previously same. Subcarinal lymph node 1.4 cm in short axis on image 56 series 4, previously 0.8 cm. Right infrahilar lymph node 0.7 cm in short axis on image 67 series 4, previously 0.5 cm. Lungs/Pleura: Increase consolidation in the right lower lobe and lingula. Bandlike scarring/volume loss also noted in both regions. There is some hazy ground-glass opacity and linear opacity in the left lower lobe which is new compared to the  prior exam and may reflect early infection. Upper Abdomen: Unremarkable Musculoskeletal: Thoracic spondylosis. Review of the MIP images confirms the above findings. IMPRESSION: 1. No filling defect is identified in the pulmonary arterial tree to suggest pulmonary embolus. 2. Increase in consolidation in the right lower lobe and lingula favoring multilobar pneumonia. There is also some new hazy ground-glass opacity and linear opacity in the left lower lobe which may reflect early infection. 3. Mild increase in size of right paratracheal, subcarinal, and right infrahilar lymph nodes likely reactive. 4. Left anterior descending coronary artery atherosclerosis. Aortic Atherosclerosis (ICD10-I70.0). Electronically Signed   By: Van Clines M.D.   On: 01/21/2023 16:44   DG Chest 2 View  Result Date: 01/21/2023 CLINICAL DATA:  Several day history of worsening cough and shortness of breath associated with chest pain, vomiting, and dizziness EXAM: CHEST - 2 VIEW COMPARISON:  Chest radiograph dated 01/18/2023 CT chest dated 01/18/2023 FINDINGS: Normal lung volumes. Curvilinear lingular opacity. Linear right lower lobe subsegmental atelectasis. No pleural effusion or pneumothorax. The heart size and mediastinal contours are within normal limits. The visualized skeletal structures are unremarkable. IMPRESSION: 1. Curvilinear lingular opacity may reflect atelectasis or early pneumonia. 2. Linear right lower lobe subsegmental atelectasis. Electronically Signed   By: Darrin Nipper M.D.   On: 01/21/2023 13:08   CT Angio Chest PE W and/or Wo Contrast  Result Date: 01/18/2023 CLINICAL DATA:  Suspected pulmonary embolism. EXAM: CT ANGIOGRAPHY CHEST WITH CONTRAST TECHNIQUE: Multidetector CT imaging of the chest was performed using the standard protocol during bolus administration of intravenous contrast. Multiplanar CT image reconstructions and MIPs were obtained to evaluate the vascular anatomy. RADIATION DOSE REDUCTION:  This exam was performed according to the departmental dose-optimization program which includes automated exposure control, adjustment of the mA and/or kV according to patient size and/or use of iterative reconstruction technique. CONTRAST:  164m OMNIPAQUE IOHEXOL 350 MG/ML SOLN COMPARISON:  None Available. FINDINGS: Cardiovascular: There is mild calcification of the aortic arch, without evidence of aortic aneurysm. Satisfactory opacification of the pulmonary arteries to the segmental level. No evidence of pulmonary embolism. Normal heart size with mild coronary artery calcification. No pericardial effusion. Mediastinum/Nodes: No enlarged mediastinal, hilar, or axillary lymph nodes. Thyroid gland, trachea, and esophagus demonstrate no significant findings. Lungs/Pleura: There is mild lingular and mild posterior right basilar linear scarring and/or atelectasis. There is no evidence of a pleural effusion or pneumothorax. Upper Abdomen: No acute abnormality. Musculoskeletal: Multilevel degenerative changes seen throughout the thoracic spine. Review of the MIP images confirms the above findings. IMPRESSION: 1. No evidence of pulmonary embolism or other acute intrathoracic process. 2. Mild lingular and posterior right basilar linear scarring and/or atelectasis. 3. Mild coronary artery calcification. 4. Aortic atherosclerosis. Aortic Atherosclerosis (ICD10-I70.0).  Electronically Signed   By: Virgina Norfolk M.D.   On: 01/18/2023 22:48   DG Chest Portable 1 View  Result Date: 01/18/2023 CLINICAL DATA:  Intermittent cold-like symptoms. EXAM: PORTABLE CHEST 1 VIEW COMPARISON:  Chest radiographs 12/09/2022, 06/01/2020 FINDINGS: Cardiac silhouette and mediastinal contours are within normal limits. Mild calcification within the aortic arch. The lungs are clear. No pleural effusion or pneumothorax. Mild multilevel degenerative disc changes of the thoracic spine. IMPRESSION: No active disease. Electronically Signed   By:  Yvonne Kendall M.D.   On: 01/18/2023 20:35    Microbiology: Recent Results (from the past 240 hour(s))  Resp panel by RT-PCR (RSV, Flu A&B, Covid) Anterior Nasal Swab     Status: None   Collection Time: 01/18/23  8:37 PM   Specimen: Anterior Nasal Swab  Result Value Ref Range Status   SARS Coronavirus 2 by RT PCR NEGATIVE NEGATIVE Final    Comment: (NOTE) SARS-CoV-2 target nucleic acids are NOT DETECTED.  The SARS-CoV-2 RNA is generally detectable in upper respiratory specimens during the acute phase of infection. The lowest concentration of SARS-CoV-2 viral copies this assay can detect is 138 copies/mL. A negative result does not preclude SARS-Cov-2 infection and should not be used as the sole basis for treatment or other patient management decisions. A negative result may occur with  improper specimen collection/handling, submission of specimen other than nasopharyngeal swab, presence of viral mutation(s) within the areas targeted by this assay, and inadequate number of viral copies(<138 copies/mL). A negative result must be combined with clinical observations, patient history, and epidemiological information. The expected result is Negative.  Fact Sheet for Patients:  EntrepreneurPulse.com.au  Fact Sheet for Healthcare Providers:  IncredibleEmployment.be  This test is no t yet approved or cleared by the [place] FDA and  has been authorized for detection and/or diagnosis of SARS-CoV-2 by FDA under an Emergency Use Authorization (EUA). This EUA will remain  in effect (meaning this test can be used) for the duration of the COVID-19 declaration under Section 564(b)(1) of the Act, 21 U.S.C.section 360bbb-3(b)(1), unless the authorization is terminated  or revoked sooner.       Influenza A by PCR NEGATIVE NEGATIVE Final   Influenza B by PCR NEGATIVE NEGATIVE Final    Comment: (NOTE) The Xpert Xpress SARS-CoV-2/FLU/RSV plus assay is  intended as an aid in the diagnosis of influenza from Nasopharyngeal swab specimens and should not be used as a sole basis for treatment. Nasal washings and aspirates are unacceptable for Xpert Xpress SARS-CoV-2/FLU/RSV testing.  Fact Sheet for Patients: EntrepreneurPulse.com.au  Fact Sheet for Healthcare Providers: IncredibleEmployment.be  This test is not yet approved or cleared by the [place] FDA and has been authorized for detection and/or diagnosis of SARS-CoV-2 by FDA under an Emergency Use Authorization (EUA). This EUA will remain in effect (meaning this test can be used) for the duration of the COVID-19 declaration under Section 564(b)(1) of the Act, 21 U.S.C. section 360bbb-3(b)(1), unless the authorization is terminated or revoked.     Resp Syncytial Virus by PCR NEGATIVE NEGATIVE Final    Comment: (NOTE) Fact Sheet for Patients: EntrepreneurPulse.com.au  Fact Sheet for Healthcare Providers: IncredibleEmployment.be  This test is not yet approved or cleared by the [place] FDA and has been authorized for detection and/or diagnosis of SARS-CoV-2 by FDA under an Emergency Use Authorization (EUA). This EUA will remain in effect (meaning this test can be used) for the duration of the COVID-19 declaration under Section 564(b)(1) of the Act,  21 U.S.C. section 360bbb-3(b)(1), unless the authorization is terminated or revoked.  Performed at [facility], [address] [place], [place] [address]   Culture, blood (Routine X 2) w Reflex to ID Panel     Status: None (Preliminary result)   Collection Time: 01/21/23 10:53 PM   Specimen: BLOOD  Result Value Ref Range Status   Specimen Description BLOOD RIGHT ANTECUBITAL  Final   Special Requests   Final    BOTTLES DRAWN AEROBIC AND ANAEROBIC Blood Culture results may not be optimal due to an inadequate volume of blood  received in culture bottles   Culture   Final    NO GROWTH 3 DAYS Performed at [facility], [place] [address], [place], [place] [address]    Report Status PENDING  Incomplete  Culture, blood (Routine X 2) w Reflex to ID Panel     Status: None (Preliminary result)   Collection Time: 01/21/23 11:01 PM   Specimen: BLOOD  Result Value Ref Range Status   Specimen Description BLOOD BLOOD RIGHT HAND  Final   Special Requests   Final    BOTTLES DRAWN AEROBIC AND ANAEROBIC Blood Culture results may not be optimal due to an inadequate volume of blood received in culture bottles   Culture   Final    NO GROWTH 3 DAYS Performed at [facility], [place] [address], [place], [place] [address]    Report Status PENDING  Incomplete     Labs: Basic Metabolic Panel: Recent Labs  Lab 01/18/23 2141 01/21/23 1245 01/21/23 2253 01/22/23 0145 01/23/23 1646  NA 139 136  --  137  --   K 3.3* 3.6  --  3.1* 4.0  CL 105 101  --  104  --   CO2 24 23  --  22  --   GLUCOSE 105* 105*  --  106*  --   BUN 12 10  --  7*  --   CREATININE 0.85 0.70 0.88 0.74  --   CALCIUM 9.2 9.3  --  8.1*  --   MG  --   --   --  2.1  --    Liver Function Tests: Recent Labs  Lab 01/18/23 2141  AST 32  ALT 20  ALKPHOS 107  BILITOT 0.4  PROT 6.9  ALBUMIN 4.0   No results for input(s): "LIPASE", "AMYLASE" in the last 168 hours. No results for input(s): "AMMONIA" in the last 168 hours. CBC: Recent Labs  Lab 01/18/23 2141 01/21/23 1245 01/21/23 2253 01/22/23 0145  WBC 6.4 9.6 7.7 7.5  NEUTROABS 3.3  --   --  4.7  HGB 12.5 13.4 11.2* 11.2*  HCT 38.4 40.3 34.0* 33.6*  MCV 89.7 89.2 90.7 89.4  PLT 189 200 160 152   Cardiac Enzymes: No results for input(s): "CKTOTAL", "CKMB", "CKMBINDEX", "TROPONINI" in the last 168 hours. BNP: BNP (last 3 results) Recent Labs    01/18/23 2141  BNP 29.0    ProBNP (last 3 results) No results for input(s): "PROBNP" in the last 8760 hours.  CBG: Recent Labs  Lab  01/23/23 1614  GLUCAP 98       Signed:  Kayleen Memos, MD [facility] 01/24/2023, 6:33 PM

## 2023-01-26 LAB — CULTURE, BLOOD (ROUTINE X 2)
Culture: NO GROWTH
Culture: NO GROWTH

## 2023-01-27 ENCOUNTER — Encounter: Payer: Self-pay | Admitting: *Deleted

## 2023-01-27 ENCOUNTER — Telehealth: Payer: Self-pay | Admitting: *Deleted

## 2023-01-27 NOTE — Transitions of Care (Post Inpatient/ED Visit) (Addendum)
   01/27/2023  Name: [name] MRN: [id number] DOB: [birth year]  Today's TOC FU Call Status: Today's TOC FU Call Status:: Successful TOC FU Call Competed TOC FU Call Complete Date: 01/27/23  Transition Care Management Follow-up Telephone Call Date of Discharge: 01/24/23 Dis[facility]) Type of Discharge: Inpatient Admission Primary Inpatient Discharge Diagnosis:: pneumonia; acute respiratory failure How have you been since you were released from the hospital?: Better ("I am feeling better than I have in months; taking the antibiotics as they prescribed them, not having any problems") Any questions or concerns?: No  Items Reviewed: Did you receive and understand the discharge instructions provided?: Yes (thoroughly reviewed with patient who verbalizes very good understanding of same) Medications obtained and verified?: Yes (Medications Reviewed) (full medication review completed; self-manages medications; confirms obtained/ taking newly Rx'd meds after hospital discharge; no discrepancies or concerns identified; pt. denies questions/ concerns around post-discharge medications) Any new allergies since your discharge?: No Dietary orders reviewed?: No Do you have support at home?: Yes People in Home: spouse Name of Support/Comfort Primary Source: "My husband is keeping a close eye on me;" reports she is independent in self-care activities  Home Care and Equipment/Supplies: [place] Ordered?: No Any new equipment or medical supplies ordered?: No  Functional Questionnaire: Do you need assistance with bathing/showering or dressing?: No Do you need assistance with meal preparation?: No Do you need assistance with eating?: No Do you have difficulty maintaining continence: No Do you need assistance with getting out of bed/getting out of a chair/moving?: No Do you have difficulty managing or taking your medications?: No  Folllow up appointments  reviewed: PCP Follow-up appointment confirmed?: Yes (care coordination in real time with scheduling care guide to successfully schedule hospital follow up PCP appointment on 01/28/23) Date of PCP follow-up appointment?: 01/28/23 Follow-up Provider: PCP [facility] Follow-up appointment confirmed?: Yes Date of Specialist follow-up appointment?: 02/03/23 Follow-Up Specialty Provider:: pulmonary Do you need transportation to your follow-up appointment?: No Do you understand care options if your condition(s) worsen?: Yes-patient verbalized understanding  SDOH Interventions Today    Flowsheet Row Most Recent Value  SDOH Interventions   Food Insecurity Interventions Intervention Not Indicated  Transportation Interventions Intervention Not Indicated  [drives self]      TOC Interventions Today    Flowsheet Row Most Recent Value  TOC Interventions   TOC Interventions Discussed/Reviewed TOC Interventions Discussed, Arranged PCP follow up within 7 days/Care Guide scheduled  [care coordination in real time with scheduling care guide to successfully schedule hospital follow up PCP appointment on 01/28/23]      Interventions Today    Flowsheet Row Most Recent Value  Chronic Disease   Chronic disease during today's visit Other  [pneumonia]  General Interventions   General Interventions Discussed/Reviewed General Interventions Discussed, Doctor Visits  Doctor Visits Discussed/Reviewed Doctor Visits Discussed, PCP, Annual Wellness Visits, Specialist  PCP/Specialist Visits Compliance with follow-up visit  Nutrition Interventions   Nutrition Discussed/Reviewed Nutrition Discussed  Pharmacy Interventions   Pharmacy Dicussed/Reviewed Pharmacy Topics Discussed  [full medication review completed]      Oneta Rack, RN, BSN, CCRN Alumnus RN CM Care Coordination/ Transition of [facility] [phone]: direct office

## 2023-01-28 ENCOUNTER — Encounter: Payer: Self-pay | Admitting: Nurse Practitioner

## 2023-01-28 ENCOUNTER — Ambulatory Visit (INDEPENDENT_AMBULATORY_CARE_PROVIDER_SITE_OTHER): Payer: Medicare HMO | Admitting: Nurse Practitioner

## 2023-01-28 VITALS — BP 128/79 | HR 89 | Temp 97.6°F | Resp 20 | Ht 64.0 in | Wt 180.0 lb

## 2023-01-28 DIAGNOSIS — Z7689 Persons encountering health services in other specified circumstances: Secondary | ICD-10-CM

## 2023-01-28 DIAGNOSIS — J189 Pneumonia, unspecified organism: Secondary | ICD-10-CM | POA: Diagnosis not present

## 2023-01-28 DIAGNOSIS — J9601 Acute respiratory failure with hypoxia: Secondary | ICD-10-CM | POA: Diagnosis not present

## 2023-01-28 NOTE — Progress Notes (Signed)
   Subjective:    Patient ID: [name], female    DOB: 1951-08-13, 72 y.o.   MRN: [id number]  Today's visit was for Transitional Care Management.  The patient was discharged from [facility] on 01/24/23 with a primary diagnosis of pneumonia and respiratory therapy.   Contact with the patient and/or caregiver, by a clinical staff member, was made on 01/27/23 and was documented as a telephone encounter within the EMR.  Through chart review and discussion with the patient I have determined that management of their condition is of moderate complexity.    Patient is doing much better since she came home. Still a little weak but is gradually improving daily.      Review of Systems  Constitutional:  Negative for chills and fever.  HENT: Negative.    Respiratory:  Positive for cough (slight cough).   Cardiovascular: Negative.   Gastrointestinal: Negative.   Genitourinary: Negative.   Neurological: Negative.   Psychiatric/Behavioral: Negative.         Objective:   Physical Exam Vitals reviewed.  Constitutional:      Appearance: Normal appearance. She is obese.  Cardiovascular:     Rate and Rhythm: Normal rate and regular rhythm.     Heart sounds: Normal heart sounds.  Pulmonary:     Effort: Pulmonary effort is normal.     Breath sounds: Rales (bil bases) present.  Skin:    General: Skin is warm and dry.  Neurological:     General: No focal deficit present.     Mental Status: She is alert and oriented to person, place, and time.  Psychiatric:        Mood and Affect: Mood normal.        Behavior: Behavior normal.    BP 128/79   Pulse 89   Temp 97.6 F (36.4 C) (Temporal)   Resp 20   Ht '5\' 4"'$  (1.626 m)   Wt 180 lb (81.6 kg)   SpO2 92%   BMI 30.90 kg/m         Assessment & Plan:  Darriell Demory Murata in today with chief complaint of Transitions Of Care   1. Encounter for support and coordination of transition of care Hospital records reviewed  2. Pneumonia of both  lower lobes due to infectious organism 3. Acute respiratory failure with hypoxia (HCC) Report any SOB Finish all antibiotics Tessalon perles for cough RTO prn    The above assessment and management plan was discussed with the patient. The patient verbalized understanding of and has agreed to the management plan. Patient is aware to call the clinic if symptoms persist or worsen. Patient is aware when to return to the clinic for a follow-up visit. Patient educated on when it is appropriate to go to the emergency department.   Mary-Margaret Hassell Done, FNP

## 2023-01-30 ENCOUNTER — Ambulatory Visit: Payer: Self-pay | Admitting: *Deleted

## 2023-01-30 NOTE — Chronic Care Management (AMB) (Signed)
   01/30/2023  Jaselyn Tonini Fanning [birth year] [id number]   Enrollment status changed to previously enrolled  Jacqlyn Larsen [facility], BSN RN Case Manager [facility] [phone]

## 2023-02-03 ENCOUNTER — Ambulatory Visit (INDEPENDENT_AMBULATORY_CARE_PROVIDER_SITE_OTHER): Payer: Medicare HMO | Admitting: Pulmonary Disease

## 2023-02-03 ENCOUNTER — Encounter (HOSPITAL_BASED_OUTPATIENT_CLINIC_OR_DEPARTMENT_OTHER): Payer: Self-pay | Admitting: Pulmonary Disease

## 2023-02-03 ENCOUNTER — Ambulatory Visit (INDEPENDENT_AMBULATORY_CARE_PROVIDER_SITE_OTHER): Payer: Medicare HMO

## 2023-02-03 VITALS — BP 120/64 | HR 89 | Temp 98.1°F | Ht 64.0 in | Wt 179.6 lb

## 2023-02-03 DIAGNOSIS — J189 Pneumonia, unspecified organism: Secondary | ICD-10-CM

## 2023-02-03 DIAGNOSIS — J9811 Atelectasis: Secondary | ICD-10-CM | POA: Insufficient documentation

## 2023-02-03 MED ORDER — AMOXICILLIN-POT CLAVULANATE 875-125 MG PO TABS: 1.0000 | ORAL_TABLET | Freq: Two times a day (BID) | ORAL | 0 refills | Status: DC

## 2023-02-03 NOTE — Progress Notes (Signed)
Subjective:    Patient ID: [name], female    DOB: 06-14-1951, 72 y.o.   MRN: [id number]  HPI  Chief Complaint  Patient presents with   Consult    Pt was hospitalized 1 week ago with pna. States she can take a deep breath and when she does, she has rattling and also coughing. Pt denies any pain when she coughs. Also has SOB associated.   72 year old never smoker presents for evaluation of symptoms after recent hospitalization for pneumonia. She reports that ever since she had COVID infection in November she has had frequent respiratory issues requiring antibiotics and steroids.  She keeps her grandkids and wonders if this may be related to catching infections from them. She finally had an ED visit on 2/24 for shortness of breath, cough and wheezing.  CT angiogram was negative for PE, showed left mid zone scarring/atelectasis.  She was given course of prednisone.  She returned to the ED on 2/27 with hypoxia and fever and was found to have left upper lobe pneumonia on repeat CT angiogram.  She required 2 L of oxygen, antibiotics by discharge she came off oxygen and was discharged on cefdinir and azithromycin COVID flu and RSV testing was negative.  Urine strep antigen was negative as well as a urine Legionella  She has completed a course of antibiotics since discharge but reports persistent shortness of breath and cough with clear sputum production.  She reports 90% improvement.  I have reviewed hospital discharge and imaging studies   Significant tests/ events reviewed  CTA chest 01/21/23  Increase in consolidation in the right lower lobe and lingula favoring multilobar pneumonia. There is also some new hazy ground-glass opacity and linear opacity in the left lower lobe Mild increase in size of right paratracheal, subcarinal, and right infrahilar lymph nodes likely reactive  No past medical history on file.   Past Surgical History:  Procedure Laterality Date   BACK SURGERY      CESAREAN SECTION     KNEE SURGERY     Bilaterally   Allergies  Allergen Reactions   Bee Venom Swelling   Rosuvastatin Other (See Comments)    myopathy   Lipitor [Atorvastatin] Other (See Comments)    myopathy      Social History   Socioeconomic History   Marital status: Married    Spouse name: Zenia Resides   Number of children: 2   Years of education: Not on file   Highest education level: Not on file  Occupational History   Not on file  Tobacco Use   Smoking status: Never   Smokeless tobacco: Never  Vaping Use   Vaping Use: Never used  Substance and Sexual Activity   Alcohol use: No   Drug use: Never   Sexual activity: Not on file  Other Topics Concern   Not on file  Social History Narrative   5 grandchildren   Social Determinants of Health   Financial Resource Strain: Low Risk  (10/01/2021)   Overall Financial Resource Strain (CARDIA)    Difficulty of Paying Living Expenses: Not hard at all  Food Insecurity: No Food Insecurity (01/27/2023)   Hunger Vital Sign    Worried About Running Out of Food in the Last Year: Never true    [place] in the Last Year: Never true  Transportation Needs: No Transportation Needs (01/27/2023)   PRAPARE - Hydrologist (Medical): No    Lack of Transportation (Non-Medical):  No  Physical Activity: Sufficiently Active (10/01/2021)   Exercise Vital Sign    Days of Exercise per Week: 5 days    Minutes of Exercise per Session: 30 min  Stress: No Stress Concern Present (10/01/2021)   [place]    Feeling of Stress : Not at all  Social Connections: [place] (10/01/2021)   Social Connection and Isolation Panel [NHANES]    Frequency of Communication with Friends and Family: More than three times a week    Frequency of Social Gatherings with Friends and Family: More than three times a week    Attends Religious Services: More than 4 times  per year    Active Member of [employer] or Organizations: Yes    Attends Music therapist: More than 4 times per year    Marital Status: Married  Human resources officer Violence: Not At Risk (10/01/2021)   Humiliation, Afraid, Rape, and Kick questionnaire    Fear of Current or Ex-Partner: No    Emotionally Abused: No    Physically Abused: No    Sexually Abused: No    Family History  Problem Relation Age of Onset   Heart disease Mother    Hypertension Mother    Alcohol abuse Father    Breast cancer Neg Hx      Review of Systems Constitutional: negative for anorexia, fevers and sweats  Eyes: negative for irritation, redness and visual disturbance  Ears, nose, mouth, throat, and face: negative for earaches, epistaxis, nasal congestion and sore throat  Respiratory: negative for cough,  sputum and wheezing  Cardiovascular: negative for chest pain, lower extremity edema, orthopnea, palpitations and syncope  Gastrointestinal: negative for abdominal pain, constipation, diarrhea, melena, nausea and vomiting  Genitourinary:negative for dysuria, frequency and hematuria  Hematologic/lymphatic: negative for bleeding, easy bruising and lymphadenopathy  Musculoskeletal:negative for arthralgias, muscle weakness and stiff joints  Neurological: negative for coordination problems, gait problems, headaches and weakness  Endocrine: negative for diabetic symptoms including polydipsia, polyuria and weight loss     Objective:   Physical Exam  Gen. Pleasant, well-nourished, in no distress, normal affect ENT - no pallor,icterus, no post nasal drip Neck: No JVD, no thyromegaly, no carotid bruits Lungs: no use of accessory muscles, no dullness to percussion, scattered bilateral rhonchi  Cardiovascular: Rhythm regular, heart sounds  normal, no murmurs or gallops, no peripheral edema Abdomen: soft and non-tender, no hepatosplenomegaly, BS normal. Musculoskeletal: No deformities, no cyanosis or  clubbing Neuro:  alert, non focal       Assessment & Plan:

## 2023-02-03 NOTE — Assessment & Plan Note (Signed)
Previous CT suggested scarring and some atelectasis in the lingula which may be residual from previous infection.  Review of her previous x-rays did not show any infiltrate.  Unfortunately she does not have previous CT scan to compare. Her repeated infections may have been a result of close contact with grandchildren.  I have advised her to get vaccinated for RSV in the future I advised her regarding airway clearance measures with Mucinex and flutter valve

## 2023-02-03 NOTE — Assessment & Plan Note (Addendum)
Chest x-ray was obtained which showed improved infiltrate in the left lingula and upper lobe.  She is clinically 90% improved but still has persistent sputum so I will give her another round of Augmentin for 7 days. Hypoxia has improved.

## 2023-02-03 NOTE — Patient Instructions (Signed)
X Augmentin 875 bid twice daily x 7 days  Mucinex 600 mg twice daily  X CXR today to follow up on pneumonia  Take albuterol MDI 2 puffs

## 2023-02-06 ENCOUNTER — Telehealth (HOSPITAL_BASED_OUTPATIENT_CLINIC_OR_DEPARTMENT_OTHER): Payer: Self-pay | Admitting: Pulmonary Disease

## 2023-02-06 NOTE — Telephone Encounter (Signed)
Pt. Called back to go over results

## 2023-02-06 NOTE — Telephone Encounter (Signed)
Rigoberto Noel, MD  [name], Waldemar Dickens, CMA Improved but not fully resolved pneumonia marking - this is not unusual for CXR to lag behind clinical improvement Repeat CXR in 1 month    Attempted to call pt but unable to reach. Left message to return call.

## 2023-02-13 ENCOUNTER — Encounter (HOSPITAL_BASED_OUTPATIENT_CLINIC_OR_DEPARTMENT_OTHER): Payer: Self-pay | Admitting: Pulmonary Disease

## 2023-02-13 NOTE — Telephone Encounter (Signed)
Mychart message sent by pt:  [name]  P [facility] Clinical Pool (supporting Kara Mead V, MD)1 hour ago (1:09 PM)    I have finished the 875 m Augmentin. I am still coughing up white mucus and wheezing upon breathing out. I am continuing the inhaler and Mucinex. Should I do more? [name]     Dr. Elsworth Soho, please advise.

## 2023-02-19 ENCOUNTER — Ambulatory Visit (INDEPENDENT_AMBULATORY_CARE_PROVIDER_SITE_OTHER): Payer: Medicare HMO

## 2023-02-19 ENCOUNTER — Ambulatory Visit (INDEPENDENT_AMBULATORY_CARE_PROVIDER_SITE_OTHER): Payer: Medicare HMO | Admitting: Pulmonary Disease

## 2023-02-19 ENCOUNTER — Encounter (HOSPITAL_BASED_OUTPATIENT_CLINIC_OR_DEPARTMENT_OTHER): Payer: Self-pay | Admitting: Pulmonary Disease

## 2023-02-19 ENCOUNTER — Telehealth (HOSPITAL_BASED_OUTPATIENT_CLINIC_OR_DEPARTMENT_OTHER): Payer: Self-pay | Admitting: Pulmonary Disease

## 2023-02-19 VITALS — BP 154/60 | HR 92 | Temp 98.3°F | Ht 64.0 in | Wt 183.8 lb

## 2023-02-19 DIAGNOSIS — R052 Subacute cough: Secondary | ICD-10-CM

## 2023-02-19 MED ORDER — GUAIFENESIN-CODEINE 100-10 MG/5ML PO SYRP: 5.0000 mL | ORAL_SOLUTION | Freq: Three times a day (TID) | ORAL | 0 refills | Status: DC | PRN

## 2023-02-19 MED ORDER — PREDNISONE 10 MG PO TABS: ORAL_TABLET | ORAL | 0 refills | Status: AC

## 2023-02-19 MED ORDER — BREZTRI AEROSPHERE 160-9-4.8 MCG/ACT IN AERO: 2.0000 | INHALATION_SPRAY | Freq: Two times a day (BID) | RESPIRATORY_TRACT | 0 refills | Status: DC

## 2023-02-19 NOTE — Patient Instructions (Addendum)
Acute on chronic bronchitis --CXR ordered. Improving infiltrate --Prednisone taper as ordered --Trial of Breztri for two weeks. TWO puffs in the morning and TWO puffs at night. Rinse out mouth out with use. Use with spacer --Cough syrup with codeine ordered to take nightly. No refills

## 2023-02-19 NOTE — Telephone Encounter (Signed)
Per JE.. pt asking to switch from Dr. Elsworth Soho to me. Please advise.

## 2023-02-19 NOTE — Progress Notes (Unsigned)
Subjective:    Patient ID: [name], female    DOB: 17-Nov-1951, 71 y.o.   MRN: [id number]  HPI  Chief Complaint  Patient presents with   Follow-up    Patient here to talk about pneumonia. Coughing up thick yellow/green phlegm.    72 year old never smoker presents for evaluation of symptoms after recent hospitalization for pneumonia. She reports that ever since she had COVID infection in November she has had frequent respiratory issues requiring antibiotics and steroids.  She keeps her grandkids and wonders if this may be related to catching infections from them. She finally had an ED visit on 2/24 for shortness of breath, cough and wheezing.  CT angiogram was negative for PE, showed left mid zone scarring/atelectasis.  She was given course of prednisone.  She returned to the ED on 2/27 with hypoxia and fever and was found to have left upper lobe pneumonia on repeat CT angiogram.  She required 2 L of oxygen, antibiotics by discharge she came off oxygen and was discharged on cefdinir and azithromycin COVID flu and RSV testing was negative.  Urine strep antigen was negative as well as a urine Legionella  She has completed a course of antibiotics since discharge but reports persistent shortness of breath and cough with clear sputum production.  She reports 90% improvement.  I have reviewed hospital discharge and imaging studies  02/19/23 72 year old female never smoker followed by Dr. Elsworth Soho. Recently established care after recent hospitalization for pneumonia. Note reviewed from 02/03/23 with nearly resolved left infiltrate but still had persistent symptoms of productive cough so treated with Augmentin. She was starting to improve on the antibiotics her productive cough improved by 50% however restarted after completing it. Worsening cough and wheezing especially at night. Reports chills. No fevers.    Significant tests/ events reviewed  CTA chest 01/21/23  Increase in consolidation in the  right lower lobe and lingula favoring multilobar pneumonia. There is also some new hazy ground-glass opacity and linear opacity in the left lower lobe Mild increase in size of right paratracheal, subcarinal, and right infrahilar lymph nodes likely reactive CXR 02/19/23 - no   No past medical history on file.   Past Surgical History:  Procedure Laterality Date   BACK SURGERY     CESAREAN SECTION     KNEE SURGERY     Bilaterally   Allergies  Allergen Reactions   Bee Venom Swelling   Rosuvastatin Other (See Comments)    myopathy   Lipitor [Atorvastatin] Other (See Comments)    myopathy      Social History   Socioeconomic History   Marital status: Married    Spouse name: Zenia Resides   Number of children: 2   Years of education: Not on file   Highest education level: Not on file  Occupational History   Not on file  Tobacco Use   Smoking status: Never   Smokeless tobacco: Never  Vaping Use   Vaping Use: Never used  Substance and Sexual Activity   Alcohol use: No   Drug use: Never   Sexual activity: Not on file  Other Topics Concern   Not on file  Social History Narrative   5 grandchildren   Social Determinants of Health   Financial Resource Strain: Low Risk  (10/01/2021)   Overall Financial Resource Strain (CARDIA)    Difficulty of Paying Living Expenses: Not hard at all  Food Insecurity: No Food Insecurity (01/27/2023)   Hunger Vital Sign  Worried About Charity fundraiser in the Last Year: Never true    [place] in the Last Year: Never true  Transportation Needs: No Transportation Needs (01/27/2023)   PRAPARE - Hydrologist (Medical): No    Lack of Transportation (Non-Medical): No  Physical Activity: Sufficiently Active (10/01/2021)   Exercise Vital Sign    Days of Exercise per Week: 5 days    Minutes of Exercise per Session: 30 min  Stress: No Stress Concern Present (10/01/2021)   [place]    Feeling of Stress : Not at all  Social Connections: [place] (10/01/2021)   Social Connection and Isolation Panel [NHANES]    Frequency of Communication with Friends and Family: More than three times a week    Frequency of Social Gatherings with Friends and Family: More than three times a week    Attends Religious Services: More than 4 times per year    Active Member of [employer] or Organizations: Yes    Attends Music therapist: More than 4 times per year    Marital Status: Married  Human resources officer Violence: Not At Risk (10/01/2021)   Humiliation, Afraid, Rape, and Kick questionnaire    Fear of Current or Ex-Partner: No    Emotionally Abused: No    Physically Abused: No    Sexually Abused: No    Family History  Problem Relation Age of Onset   Heart disease Mother    Hypertension Mother    Alcohol abuse Father    Breast cancer Neg Hx      Review of Systems  Constitutional:  Negative for chills, diaphoresis and fever.  HENT:  Negative for congestion.   Respiratory:  Positive for cough and wheezing. Negative for shortness of breath.   Cardiovascular:  Negative for chest pain, palpitations and leg swelling.   Constitutional: negative for anorexia, fevers and sweats  Eyes: negative for irritation, redness and visual disturbance  Ears, nose, mouth, throat, and face: negative for earaches, epistaxis, nasal congestion and sore throat  Respiratory: negative for cough,  sputum and wheezing  Cardiovascular: negative for chest pain, lower extremity edema, orthopnea, palpitations and syncope  Gastrointestinal: negative for abdominal pain, constipation, diarrhea, melena, nausea and vomiting  Genitourinary:negative for dysuria, frequency and hematuria  Hematologic/lymphatic: negative for bleeding, easy bruising and lymphadenopathy  Musculoskeletal:negative for arthralgias, muscle weakness and stiff joints  Neurological: negative for  coordination problems, gait problems, headaches and weakness  Endocrine: negative for diabetic symptoms including polydipsia, polyuria and weight loss     Objective:   Physical Exam  Gen. Pleasant, well-nourished, in no distress, normal affect ENT - no pallor,icterus, no post nasal drip Neck: No JVD, no thyromegaly, no carotid bruits Lungs: no use of accessory muscles, no dullness to percussion, scattered bilateral rhonchi  Cardiovascular: Rhythm regular, heart sounds  normal, no murmurs or gallops, no peripheral edema Abdomen: soft and non-tender, no hepatosplenomegaly, BS normal. Musculoskeletal: No deformities, no cyanosis or clubbing Neuro:  alert, non focal       Assessment & Plan:   --CXR ordered. Improving infiltrate --Prednisone taper as ordered --Trial of Breztri for two weeks. TWO puffs in the morning and TWO puffs at night. Rinse out mouth out with use. Use with spacer --Cough syrup with codeine ordered to take nightly. No refills

## 2023-02-20 ENCOUNTER — Encounter (HOSPITAL_BASED_OUTPATIENT_CLINIC_OR_DEPARTMENT_OTHER): Payer: Self-pay | Admitting: Pulmonary Disease

## 2023-02-20 NOTE — Telephone Encounter (Addendum)
[place] with me. Please ensure recall is for me

## 2023-02-20 NOTE — Telephone Encounter (Signed)
Patient is asking to switch providers from Dr Elsworth Soho to DR Loanne Drilling  Dr Loanne Drilling are you ok with this change  Dr Elsworth Soho are you ok with this change  Please advise

## 2023-02-24 NOTE — Telephone Encounter (Signed)
Closing encounter due to pt having a recent visit with JE after message was sent.

## 2023-02-28 ENCOUNTER — Telehealth: Payer: Self-pay | Admitting: Pharmacist

## 2023-02-28 NOTE — Telephone Encounter (Signed)
PA request was sent to me Not needed per cover my meds Patient has been filling at [facility] pharmacy per records

## 2023-03-10 ENCOUNTER — Telehealth: Payer: Self-pay

## 2023-03-10 NOTE — Telephone Encounter (Signed)
Rickie Kealey (Key: [id number]) Rx #: [id number] Repatha SureClick 140MG /ML auto-injectors Form Ambulance person PA Form (2017 NCPDP) Created 1 hour ago Sent to Plan 3 minutes ago Plan Response 3 minutes ago Submit Clinical Questions less than a minute ago Determination Wait for Determination Please wait for [employer] NCPDP 2017 to return a determination.

## 2023-03-12 NOTE — Telephone Encounter (Signed)
Tajuana Quesnel (Key: [id number]) Rx #: [id number] Repatha SureClick /ML auto-injectors  Your PA request has been approved. Additional information will be provided in the approval communication. (Message 1145). Authorization Expiration Date: March 09, 2024

## 2023-03-13 ENCOUNTER — Telehealth: Payer: Self-pay | Admitting: Nurse Practitioner

## 2023-03-13 NOTE — Telephone Encounter (Signed)
Contacted Montasia Chisenhall [place] to schedule their annual wellness visit. Appointment made for 03/24/2023.  Thank you,  Judeth Cornfield,  AMB Clinical Support [facility] AWV Program Direct Dial ??[phone]

## 2023-03-18 NOTE — Patient Instructions (Signed)
Our records indicate that you are due for your screening mammogram.  Please call the imaging center that does your yearly mammograms to make an appointment for a mammogram at your earliest convenience. Our office also has a mobile unit through the [facility] that comes to our location. Please call our office if you would like to make an appointment.   

## 2023-03-31 ENCOUNTER — Encounter: Payer: Self-pay | Admitting: Nurse Practitioner

## 2023-03-31 ENCOUNTER — Ambulatory Visit (INDEPENDENT_AMBULATORY_CARE_PROVIDER_SITE_OTHER): Payer: Medicare HMO | Admitting: Nurse Practitioner

## 2023-03-31 VITALS — BP 131/77 | HR 82 | Temp 97.2°F | Resp 20 | Ht 64.0 in | Wt 182.0 lb

## 2023-03-31 DIAGNOSIS — E8881 Metabolic syndrome: Secondary | ICD-10-CM | POA: Diagnosis not present

## 2023-03-31 DIAGNOSIS — Z6830 Body mass index (BMI) 30.0-30.9, adult: Secondary | ICD-10-CM

## 2023-03-31 DIAGNOSIS — F411 Generalized anxiety disorder: Secondary | ICD-10-CM | POA: Diagnosis not present

## 2023-03-31 DIAGNOSIS — K219 Gastro-esophageal reflux disease without esophagitis: Secondary | ICD-10-CM | POA: Diagnosis not present

## 2023-03-31 DIAGNOSIS — E782 Mixed hyperlipidemia: Secondary | ICD-10-CM | POA: Diagnosis not present

## 2023-03-31 DIAGNOSIS — R7309 Other abnormal glucose: Secondary | ICD-10-CM | POA: Diagnosis not present

## 2023-03-31 LAB — BAYER DCA HB A1C WAIVED: HB A1C (BAYER DCA - WAIVED): 5.3 % (ref 4.8–5.6)

## 2023-03-31 MED ORDER — ESCITALOPRAM OXALATE 20 MG PO TABS: 20.0000 mg | ORAL_TABLET | Freq: Every day | ORAL | 5 refills | Status: DC

## 2023-03-31 MED ORDER — ALPRAZOLAM 0.5 MG PO TABS: 0.5000 mg | ORAL_TABLET | Freq: Two times a day (BID) | ORAL | 2 refills | Status: DC | PRN

## 2023-03-31 NOTE — Addendum Note (Signed)
Addended by: Cleda Daub on: 03/31/2023 10:24 AM   Modules accepted: Orders

## 2023-03-31 NOTE — Progress Notes (Signed)
Subjective:    Patient ID: [name], female    DOB: 11-Jun-1951, 72 y.o.   MRN: [id number]   Chief Complaint: medical management of chronic issues     HPI:  [name] is a 72 y.o. who identifies as a female who was assigned female at birth.   Social history: Lives with: husband Work history: plans trips for senior citizens   Comes in today for follow up of the following chronic medical issues:  1. Mixed hyperlipidemia Does not really watch diet and has not been doing any exercise. Has started repatha since last visit Lab Results  Component Value Date   CHOL 173 09/30/2022   HDL 47 09/30/2022   LDLCALC 99 09/30/2022   TRIG 154 (H) 09/30/2022   CHOLHDL 3.7 09/30/2022   The 10-year ASCVD risk score (Arnett DK, et al., 2019) is: 8.8%   2. Metabolic syndrome Does not check blood sugars at home Lab Results  Component Value Date   HGBA1C 5.2 10/24/2021     3. Gastroesophageal reflux disease without esophagitis Only use OTC med as needed  4. GAD (generalized anxiety disorder) Stays stressed. Take xanax prn BID    03/31/2023    9:53 AM 01/28/2023    3:25 PM 12/30/2022   11:49 AM 09/30/2022    9:06 AM  GAD 7 : Generalized Anxiety Score  Nervous, Anxious, on Edge 0 0 0 0  Control/stop worrying 0 0 0 1  Worry too much - different things 0 0 0 1  Trouble relaxing 2 0 0 2  Restless 0 0 0 0  Easily annoyed or irritable 1 0 0 0  Afraid - awful might happen 0 0 0 0  Total GAD 7 Score 3 0 0 4  Anxiety Difficulty Somewhat difficult Not difficult at all Not difficult at all Somewhat difficult      5. BMI 30.0-30.9,adult No recent weight changes Wt Readings from Last 3 Encounters:  03/31/23 182 lb (82.6 kg)  02/19/23 183 lb 12.8 oz (83.4 kg)  02/03/23 179 lb 9.6 oz (81.5 kg)   BMI Readings from Last 3 Encounters:  03/31/23 31.24 kg/m  02/19/23 31.55 kg/m  02/03/23 30.83 kg/m     New complaints: None today  Allergies  Allergen Reactions   Bee Venom  Swelling   Rosuvastatin Other (See Comments)    myopathy   Lipitor [Atorvastatin] Other (See Comments)    myopathy   Outpatient Encounter Medications as of 03/31/2023  Medication Sig   albuterol (VENTOLIN HFA) 108 (90 Base) MCG/ACT inhaler Inhale 2 puffs into the lungs every 4 (four) hours as needed for wheezing or shortness of breath.   ALPRAZolam (XANAX) 0.5 MG tablet Take 1 tablet (0.5 mg total) by mouth 2 (two) times daily as needed for anxiety.   Budeson-Glycopyrrol-Formoterol (BREZTRI AEROSPHERE) 160-9-4.8 MCG/ACT AERO Inhale 2 puffs into the lungs in the morning and at bedtime.   escitalopram (LEXAPRO) 20 MG tablet Take 1 tablet (20 mg total) by mouth daily.   Evolocumab (REPATHA SURECLICK) 140 MG/ML SOAJ Inject 150 mg into the skin every 14 (fourteen) days.   guaiFENesin-codeine (ROBITUSSIN AC) 100-10 MG/5ML syrup Take 5 mLs by mouth 3 (three) times daily as needed for cough.   meclizine (ANTIVERT) 25 MG tablet Take 1 tablet (25 mg total) by mouth 3 (three) times daily as needed for dizziness.   Polyethyl Glycol-Propyl Glycol (SYSTANE FREE OP) Place 1 drop into both eyes daily as needed (dry eyes).   No  facility-administered encounter medications on file as of 03/31/2023.    Past Surgical History:  Procedure Laterality Date   BACK SURGERY     CESAREAN SECTION     KNEE SURGERY     Bilaterally    Family History  Problem Relation Age of Onset   Heart disease Mother    Hypertension Mother    Alcohol abuse Father    Breast cancer Neg Hx       Controlled substance contract: n/a     Review of Systems  Constitutional:  Negative for diaphoresis.  Eyes:  Negative for pain.  Respiratory:  Negative for shortness of breath.   Cardiovascular:  Negative for chest pain, palpitations and leg swelling.  Gastrointestinal:  Negative for abdominal pain.  Endocrine: Negative for polydipsia.  Skin:  Negative for rash.  Neurological:  Negative for dizziness, weakness and headaches.   Hematological:  Does not bruise/bleed easily.  All other systems reviewed and are negative.      Objective:   Physical Exam Vitals and nursing note reviewed.  Constitutional:      General: She is not in acute distress.    Appearance: Normal appearance. She is well-developed.  HENT:     Head: Normocephalic.     Right Ear: Tympanic membrane normal.     Left Ear: Tympanic membrane normal.     Nose: Nose normal.     Mouth/Throat:     Mouth: Mucous membranes are moist.  Eyes:     Pupils: Pupils are equal, round, and reactive to light.  Neck:     Vascular: No carotid bruit or JVD.  Cardiovascular:     Rate and Rhythm: Normal rate and regular rhythm.     Heart sounds: Normal heart sounds.  Pulmonary:     Effort: Pulmonary effort is normal. No respiratory distress.     Breath sounds: Normal breath sounds. No wheezing or rales.  Chest:     Chest wall: No tenderness.  Abdominal:     General: Bowel sounds are normal. There is no distension or abdominal bruit.     Palpations: Abdomen is soft. There is no hepatomegaly, splenomegaly, mass or pulsatile mass.     Tenderness: There is no abdominal tenderness.  Musculoskeletal:        General: Normal range of motion.     Cervical back: Normal range of motion and neck supple.  Lymphadenopathy:     Cervical: No cervical adenopathy.  Skin:    General: Skin is warm and dry.  Neurological:     Mental Status: She is alert and oriented to person, place, and time.     Deep Tendon Reflexes: Reflexes are normal and symmetric.  Psychiatric:        Behavior: Behavior normal.        Thought Content: Thought content normal.        Judgment: Judgment normal.    BP 131/77   Pulse 82   Temp (!) 97.2 F (36.2 C) (Temporal)   Resp 20   Ht 5\' 4"  (1.626 m)   Wt 182 lb (82.6 kg)   SpO2 95%   BMI 31.24 kg/m         Assessment & Plan:   [name] comes in today with chief complaint of Medical Management of Chronic Issues   Diagnosis  and orders addressed:  1. Mixed hyperlipidemia Lowfat diet. Continue repatha  2. Metabolic syndrome Watch carb sin diet  3. Gastroesophageal reflux disease without esophagitis Avoid spicy foods  Do not eat 2 hours prior to bedtime  4. GAD (generalized anxiety disorder) Stress management - ALPRAZolam (XANAX) 0.5 MG tablet; Take 1 tablet (0.5 mg total) by mouth 2 (two) times daily as needed for anxiety.  Dispense: 60 tablet; Refill: 2 - escitalopram (LEXAPRO) 20 MG tablet; Take 1 tablet (20 mg total) by mouth daily.  Dispense: 30 tablet; Refill: 5  5. BMI 30.0-30.9,adult Discussed diet and exercise for person with BMI >25 Will recheck weight in 3-6 months    Labs pending Health Maintenance reviewed Diet and exercise encouraged  Follow up plan: 6 months   Mary-Margaret Daphine Deutscher, FNP

## 2023-04-01 LAB — CBC WITH DIFFERENTIAL/PLATELET
Basophils Absolute: 0.1 10*3/uL (ref 0.0–0.2)
Basos: 1 %
EOS (ABSOLUTE): 0.4 10*3/uL (ref 0.0–0.4)
Eos: 7 %
Hematocrit: 42.3 % (ref 34.0–46.6)
Hemoglobin: 13.5 g/dL (ref 11.1–15.9)
Immature Grans (Abs): 0 10*3/uL (ref 0.0–0.1)
Immature Granulocytes: 0 %
Lymphocytes Absolute: 2 10*3/uL (ref 0.7–3.1)
Lymphs: 35 %
MCH: 28.7 pg (ref 26.6–33.0)
MCHC: 31.9 g/dL (ref 31.5–35.7)
MCV: 90 fL (ref 79–97)
Monocytes Absolute: 0.3 10*3/uL (ref 0.1–0.9)
Monocytes: 5 %
Neutrophils Absolute: 2.9 10*3/uL (ref 1.4–7.0)
Neutrophils: 52 %
Platelets: 189 10*3/uL (ref 150–450)
RBC: 4.7 x10E6/uL (ref 3.77–5.28)
RDW: 13.2 % (ref 11.7–15.4)
WBC: 5.7 10*3/uL (ref 3.4–10.8)

## 2023-04-01 LAB — CMP14+EGFR
ALT: 24 IU/L (ref 0–32)
AST: 43 IU/L — ABNORMAL HIGH (ref 0–40)
Albumin/Globulin Ratio: 1.6 (ref 1.2–2.2)
Albumin: 4.1 g/dL (ref 3.9–4.9)
Alkaline Phosphatase: 135 IU/L — ABNORMAL HIGH (ref 44–121)
BUN/Creatinine Ratio: 13 (ref 12–28)
BUN: 9 mg/dL (ref 8–27)
Bilirubin Total: 0.5 mg/dL (ref 0.0–1.2)
CO2: 22 mmol/L (ref 20–29)
Calcium: 9.7 mg/dL (ref 8.7–10.3)
Chloride: 104 mmol/L (ref 96–106)
Creatinine, Ser: 0.67 mg/dL (ref 0.57–1.00)
Globulin, Total: 2.6 g/dL (ref 1.5–4.5)
Glucose: 100 mg/dL — ABNORMAL HIGH (ref 70–99)
Potassium: 4.4 mmol/L (ref 3.5–5.2)
Sodium: 142 mmol/L (ref 134–144)
Total Protein: 6.7 g/dL (ref 6.0–8.5)
eGFR: 95 mL/min/{1.73_m2} (ref >59)

## 2023-04-01 LAB — LIPID PANEL
Chol/HDL Ratio: 6.3 ratio — ABNORMAL HIGH (ref 0.0–4.4)
Cholesterol, Total: 253 mg/dL — ABNORMAL HIGH (ref 100–199)
HDL: 40 mg/dL (ref >39)
LDL Chol Calc (NIH): 170 mg/dL — ABNORMAL HIGH (ref 0–99)
Triglycerides: 229 mg/dL — ABNORMAL HIGH (ref 0–149)
VLDL Cholesterol Cal: 43 mg/dL — ABNORMAL HIGH (ref 5–40)

## 2023-04-09 ENCOUNTER — Telehealth (INDEPENDENT_AMBULATORY_CARE_PROVIDER_SITE_OTHER): Payer: Medicare HMO | Admitting: Nurse Practitioner

## 2023-04-09 ENCOUNTER — Encounter: Payer: Self-pay | Admitting: Nurse Practitioner

## 2023-04-09 DIAGNOSIS — J020 Streptococcal pharyngitis: Secondary | ICD-10-CM | POA: Diagnosis not present

## 2023-04-09 MED ORDER — AMOXICILLIN 875 MG PO TABS: 875.0000 mg | ORAL_TABLET | Freq: Two times a day (BID) | ORAL | 0 refills | Status: DC

## 2023-04-09 NOTE — Progress Notes (Signed)
Virtual Visit Consent   [name], you are scheduled for a virtual visit with Mary-Margaret Daphine Deutscher, FNP, a [facility] provider, today.     Just as with appointments in the office, your consent must be obtained to participate.  Your consent will be active for this visit and any virtual visit you may have with one of our providers in the next 365 days.     If you have a MyChart account, a copy of this consent can be sent to you electronically.  All virtual visits are billed to your insurance company just like a traditional visit in the office.    As this is a virtual visit, video technology does not allow for your provider to perform a traditional examination.  This may limit your provider's ability to fully assess your condition.  If your provider identifies any concerns that need to be evaluated in person or the need to arrange testing (such as labs, EKG, etc.), we will make arrangements to do so.     Although advances in technology are sophisticated, we cannot ensure that it will always work on either your end or our end.  If the connection with a video visit is poor, the visit may have to be switched to a telephone visit.  With either a video or telephone visit, we are not always able to ensure that we have a secure connection.     I need to obtain your verbal consent now.   Are you willing to proceed with your visit today? YES   [name] has provided verbal consent on 04/09/2023 for a virtual visit (video or telephone).   Mary-Margaret Daphine Deutscher, FNP   Date: 04/09/2023 9:36 AM   Virtual Visit via Video Note   I, Mary-Margaret Daphine Deutscher, connected with [name] ([id number], 1951-06-14) on 04/09/23 at 10:00 AM EDT by a video-enabled telemedicine application and verified that I am speaking with the correct person using two identifiers.  Location: Patient: Virtual Visit Location Patient: Home Provider: Virtual Visit Location Provider: Mobile   I discussed the limitations of  evaluation and management by telemedicine and the availability of in person appointments. The patient expressed understanding and agreed to proceed.    History of Present Illness: [name] is a 72 y.o. who identifies as a female who was assigned female at birth, and is being seen today for sore throat.  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 All of her grandkids have been dx with strep throat his week. She now has a sore throat.  Sore Throat  This is a new problem. The current episode started today. The problem has been gradually worsening. Neither side of throat is experiencing more pain than the other. There has been no fever. The pain is at a severity of 9/10. Associated symptoms include congestion, headaches and trouble swallowing. Pertinent negatives include no swollen glands. She has had exposure to strep. She has tried acetaminophen for the symptoms. The treatment provided mild relief.    Review of Systems  HENT:  Positive for congestion and trouble swallowing.   Neurological:  Positive for headaches.    Problems:  Patient Active Problem List   Diagnosis Date Noted   Atelectasis of left lung 02/03/2023   Community acquired pneumonia of left upper lobe of lung 01/21/2023   Gastroesophageal reflux disease without esophagitis 09/30/2022   Metabolic syndrome 04/24/2021   BMI 30.0-30.9,adult 04/24/2021   Hyperlipidemia 06/01/2020   GAD (  generalized anxiety disorder) 05/21/2019    Allergies:  Allergies  Allergen Reactions   Bee Venom Swelling   Rosuvastatin Other (See Comments)    myopathy   Lipitor [Atorvastatin] Other (See Comments)    myopathy   Medications:  Current Outpatient Medications:    ALPRAZolam (XANAX) 0.5 MG tablet, Take 1 tablet (0.5 mg total) by mouth 2 (two) times daily as needed for anxiety., Disp: 60 tablet, Rfl: 2   escitalopram (LEXAPRO) 20 MG tablet, Take 1 tablet (20 mg total) by mouth daily., Disp: 30 tablet, Rfl: 5    Evolocumab (REPATHA SURECLICK) 140 MG/ML SOAJ, Inject 150 mg into the skin every 14 (fourteen) days., Disp: 2 mL, Rfl: 11   meclizine (ANTIVERT) 25 MG tablet, Take 1 tablet (25 mg total) by mouth 3 (three) times daily as needed for dizziness., Disp: 30 tablet, Rfl: 0   Polyethyl Glycol-Propyl Glycol (SYSTANE FREE OP), Place 1 drop into both eyes daily as needed (dry eyes)., Disp: , Rfl:   Observations/Objective: Patient is well-developed, well-nourished in no acute distress.  Resting comfortably  at home.  Head is normocephalic, atraumatic.  No labored breathing.  Speech is clear and coherent with logical content.  Patient is alert and oriented at baseline.  Posterior oral pharynx erythematous with white spots  Assessment and Plan:  [name] in today with chief complaint of No chief complaint on file.   1. Strep pharyngitis Force fluids Motrin or tylenol OTC OTC decongestant Throat lozenges if help New toothbrush in 3 days  - amoxicillin (AMOXIL) 875 MG tablet; Take 1 tablet (875 mg total) by mouth 2 (two) times daily. 1 po BID  Dispense: 20 tablet; Refill: 0    Follow Up Instructions: I discussed the assessment and treatment plan with the patient. The patient was provided an opportunity to ask questions and all were answered. The patient agreed with the plan and demonstrated an understanding of the instructions.  A copy of instructions were sent to the patient via MyChart.  The patient was advised to call back or seek an in-person evaluation if the symptoms worsen or if the condition fails to improve as anticipated.  Time:  I spent 8 minutes with the patient via telehealth technology discussing the above problems/concerns.    Mary-Margaret Daphine Deutscher, FNP

## 2023-04-09 NOTE — Patient Instructions (Signed)
  Tobey Bride Rudie, thank you for joining Bennie Pierini, FNP for today's virtual visit.  While this provider is not your primary care provider (PCP), if your PCP is located in our provider database this encounter information will be shared with them immediately following your visit.   A [place] MyChart account gives you access to today's visit and all your visits, tests, and labs performed at [facility] " click here if you don't have a [place] MyChart account or go to [email or web address]  Consent: (Patient) [name] provided verbal consent for this virtual visit at the beginning of the encounter.  Current Medications:  Current Outpatient Medications:    amoxicillin (AMOXIL) 875 MG tablet, Take 1 tablet (875 mg total) by mouth 2 (two) times daily. 1 po BID, Disp: 20 tablet, Rfl: 0   ALPRAZolam (XANAX) 0.5 MG tablet, Take 1 tablet (0.5 mg total) by mouth 2 (two) times daily as needed for anxiety., Disp: 60 tablet, Rfl: 2   escitalopram (LEXAPRO) 20 MG tablet, Take 1 tablet (20 mg total) by mouth daily., Disp: 30 tablet, Rfl: 5   Evolocumab (REPATHA SURECLICK) 140 MG/ML SOAJ, Inject 150 mg into the skin every 14 (fourteen) days., Disp: 2 mL, Rfl: 11   meclizine (ANTIVERT) 25 MG tablet, Take 1 tablet (25 mg total) by mouth 3 (three) times daily as needed for dizziness., Disp: 30 tablet, Rfl: 0   Polyethyl Glycol-Propyl Glycol (SYSTANE FREE OP), Place 1 drop into both eyes daily as needed (dry eyes)., Disp: , Rfl:    Medications ordered in this encounter:  Meds ordered this encounter  Medications   amoxicillin (AMOXIL) 875 MG tablet    Sig: Take 1 tablet (875 mg total) by mouth 2 (two) times daily. 1 po BID    Dispense:  20 tablet    Refill:  0    Order Specific Question:   Supervising Provider    Answer:   Arville Care A [[id number]]     *If you need refills on other medications prior to your next appointment, please contact your  pharmacy*  Follow-Up: Call back or seek an in-person evaluation if the symptoms worsen or if the condition fails to improve as anticipated.  [facility] [phone]  Other Instructions Force fluids Motrin or tylenol OTC OTC decongestant Throat lozenges if help New toothbrush in 3 days    If you have been instructed to have an in-person evaluation today at a local Urgent Care facility, please use the link below. It will take you to a list of all of our available [facility], including address, phone number and hours of operation. Please do not delay care.  [facility]  If you or a family member do not have a primary care provider, use the link below to schedule a visit and establish care. When you choose a [facility] primary care physician or advanced practice provider, you gain a long-term partner in health. Find a Primary Care Provider  Learn more about 's in-office and virtual care options:  - Get Care Now

## 2023-04-23 DIAGNOSIS — L814 Other melanin hyperpigmentation: Secondary | ICD-10-CM | POA: Diagnosis not present

## 2023-04-23 DIAGNOSIS — I788 Other diseases of capillaries: Secondary | ICD-10-CM | POA: Diagnosis not present

## 2023-04-23 DIAGNOSIS — L821 Other seborrheic keratosis: Secondary | ICD-10-CM | POA: Diagnosis not present

## 2023-04-30 ENCOUNTER — Ambulatory Visit (INDEPENDENT_AMBULATORY_CARE_PROVIDER_SITE_OTHER): Payer: Medicare HMO

## 2023-04-30 VITALS — Ht 64.0 in | Wt 176.0 lb

## 2023-04-30 DIAGNOSIS — Z0000 Encounter for general adult medical examination without abnormal findings: Secondary | ICD-10-CM

## 2023-04-30 DIAGNOSIS — Z1231 Encounter for screening mammogram for malignant neoplasm of breast: Secondary | ICD-10-CM | POA: Diagnosis not present

## 2023-04-30 NOTE — Patient Instructions (Signed)
[name] , Thank you for taking time to come for your Medicare Wellness Visit. I appreciate your ongoing commitment to your health goals. Please review the following plan we discussed and let me know if I can assist you in the future.   These are the goals we discussed:  Goals       DIET - INCREASE WATER INTAKE      HLD pharmd goal (pt-stated)      Current Barriers:  Unable to independently afford treatment regimen Unable to achieve control of hyperlipidemia  Suboptimal therapeutic regimen for hyperlipidemia  Pharmacist Clinical Goal(s):  patient will verbalize ability to afford treatment regimen achieve control of hyperlipidemia as evidenced by goal ldl<100 through collaboration with PharmD and provider.   Interventions: 1:1 collaboration with Bennie Pierini, FNP regarding development and update of comprehensive plan of care as evidenced by provider attestation and co-signature Inter-disciplinary care team collaboration (see longitudinal plan of care) Comprehensive medication review performed; medication list updated in electronic medical record  Hyperlipidemia:  Goal on Track (progressing): YES. Uncontrolled; current treatment: Praluent to Repatha per insurance LDL goal<100 -- patient now at goal! Medications previously tried: ATORVASTATIN, ROSUVASTATIN  Current dietary patterns: recommend heart healthy diet Educated on repatha  Recommended repatha 140mg  every 14 days based on compliance and insurance Assessed patient finances. Re-Enrolled in the [employer] grant for copay assistance; patient has copay card Lipid Panel     Component Value Date/Time   CHOL 173 09/30/2022 0931   TRIG 154 (H) 09/30/2022 0931   HDL 47 09/30/2022 0931   CHOLHDL 3.7 09/30/2022 0931   LDLCALC 99 09/30/2022 0931   LABVLDL 27 09/30/2022 0931     Patient Goals/Self-Care Activities patient will:  - take medications as prescribed as evidenced by patient report and record review collaborate  with provider on medication access solutions target a minimum of 150 minutes of moderate intensity exercise weekly engage in dietary modifications by HEART HEALTHY DIET         This is a list of the screening recommended for you and due dates:  Health Maintenance  Topic Date Due   Hepatitis C Screening  Never done   COVID-19 Vaccine (3 - Moderna risk series) 10/17/2020   Mammogram  11/06/2022   Colon Cancer Screening  03/31/2023   Zoster (Shingles) Vaccine (1 of 2) 04/30/2023*   Cologuard (Stool DNA test)  06/15/2023   Flu Shot  06/26/2023   Medicare Annual Wellness Visit  04/29/2024   Pneumonia Vaccine (3 of 3 - PPSV23 or PCV20) 05/20/2024   DTaP/Tdap/Td vaccine (5 - Td or Tdap) 02/10/2031   DEXA scan (bone density measurement)  Completed   HPV Vaccine  Aged Out  *Topic was postponed. The date shown is not the original due date.    Advanced directives: Advance directive discussed with you today. I have provided a copy for you to complete at home and have notarized. Once this is complete please bring a copy in to our office so we can scan it into your chart.   Conditions/risks identified: Aim for 30 minutes of exercise or brisk walking, 6-8 glasses of water, and 5 servings of fruits and vegetables each day.   Next appointment: Follow up in one year for your annual wellness visit    Preventive Care 65 Years and Older, Female Preventive care refers to lifestyle choices and visits with your health care provider that can promote health and wellness. What does preventive care include? A yearly physical exam. This is also  called an annual well check. Dental exams once or twice a year. Routine eye exams. Ask your health care provider how often you should have your eyes checked. Personal lifestyle choices, including: Daily care of your teeth and gums. Regular physical activity. Eating a healthy diet. Avoiding tobacco and drug use. Limiting alcohol use. Practicing safe  sex. Taking low-dose aspirin every day. Taking vitamin and mineral supplements as recommended by your health care provider. What happens during an annual well check? The services and screenings done by your health care provider during your annual well check will depend on your age, overall health, lifestyle risk factors, and family history of disease. Counseling  Your health care provider may ask you questions about your: Alcohol use. Tobacco use. Drug use. Emotional well-being. Home and relationship well-being. Sexual activity. Eating habits. History of falls. Memory and ability to understand (cognition). Work and work Astronomer. Reproductive health. Screening  You may have the following tests or measurements: Height, weight, and BMI. Blood pressure. Lipid and cholesterol levels. These may be checked every 5 years, or more frequently if you are over 34 years old. Skin check. Lung cancer screening. You may have this screening every year starting at age 24 if you have a 30-pack-year history of smoking and currently smoke or have quit within the past 15 years. Fecal occult blood test (FOBT) of the stool. You may have this test every year starting at age 66. Flexible sigmoidoscopy or colonoscopy. You may have a sigmoidoscopy every 5 years or a colonoscopy every 10 years starting at age 52. Hepatitis C blood test. Hepatitis B blood test. Sexually transmitted disease (STD) testing. Diabetes screening. This is done by checking your blood sugar (glucose) after you have not eaten for a while (fasting). You may have this done every 1-3 years. Bone density scan. This is done to screen for osteoporosis. You may have this done starting at age 1. Mammogram. This may be done every 1-2 years. Talk to your health care provider about how often you should have regular mammograms. Talk with your health care provider about your test results, treatment options, and if necessary, the need for more  tests. Vaccines  Your health care provider may recommend certain vaccines, such as: Influenza vaccine. This is recommended every year. Tetanus, diphtheria, and acellular pertussis (Tdap, Td) vaccine. You may need a Td booster every 10 years. Zoster vaccine. You may need this after age 71. Pneumococcal 13-valent conjugate (PCV13) vaccine. One dose is recommended after age 62. Pneumococcal polysaccharide (PPSV23) vaccine. One dose is recommended after age 58. Talk to your health care provider about which screenings and vaccines you need and how often you need them. This information is not intended to replace advice given to you by your health care provider. Make sure you discuss any questions you have with your health care provider. Document Released: 12/08/2015 Document Revised: 07/31/2016 Document Reviewed: 09/12/2015 Elsevier Interactive Patient Education  2017 [employer].  Fall Prevention in the Home Falls can cause injuries. They can happen to people of all ages. There are many things you can do to make your home safe and to help prevent falls. What can I do on the outside of my home? Regularly fix the edges of walkways and driveways and fix any cracks. Remove anything that might make you trip as you walk through a door, such as a raised step or threshold. Trim any bushes or trees on the path to your home. Use bright outdoor lighting. Clear any walking paths  of anything that might make someone trip, such as rocks or tools. Regularly check to see if handrails are loose or broken. Make sure that both sides of any steps have handrails. Any raised decks and porches should have guardrails on the edges. Have any leaves, snow, or ice cleared regularly. Use sand or salt on walking paths during winter. Clean up any spills in your garage right away. This includes oil or grease spills. What can I do in the bathroom? Use night lights. Install grab bars by the toilet and in the tub and shower.  Do not use towel bars as grab bars. Use non-skid mats or decals in the tub or shower. If you need to sit down in the shower, use a plastic, non-slip stool. Keep the floor dry. Clean up any water that spills on the floor as soon as it happens. Remove soap buildup in the tub or shower regularly. Attach bath mats securely with double-sided non-slip rug tape. Do not have throw rugs and other things on the floor that can make you trip. What can I do in the bedroom? Use night lights. Make sure that you have a light by your bed that is easy to reach. Do not use any sheets or blankets that are too big for your bed. They should not hang down onto the floor. Have a firm chair that has side arms. You can use this for support while you get dressed. Do not have throw rugs and other things on the floor that can make you trip. What can I do in the kitchen? Clean up any spills right away. Avoid walking on wet floors. Keep items that you use a lot in easy-to-reach places. If you need to reach something above you, use a strong step stool that has a grab bar. Keep electrical cords out of the way. Do not use floor polish or wax that makes floors slippery. If you must use wax, use non-skid floor wax. Do not have throw rugs and other things on the floor that can make you trip. What can I do with my stairs? Do not leave any items on the stairs. Make sure that there are handrails on both sides of the stairs and use them. Fix handrails that are broken or loose. Make sure that handrails are as long as the stairways. Check any carpeting to make sure that it is firmly attached to the stairs. Fix any carpet that is loose or worn. Avoid having throw rugs at the top or bottom of the stairs. If you do have throw rugs, attach them to the floor with carpet tape. Make sure that you have a light switch at the top of the stairs and the bottom of the stairs. If you do not have them, ask someone to add them for you. What else  can I do to help prevent falls? Wear shoes that: Do not have high heels. Have rubber bottoms. Are comfortable and fit you well. Are closed at the toe. Do not wear sandals. If you use a stepladder: Make sure that it is fully opened. Do not climb a closed stepladder. Make sure that both sides of the stepladder are locked into place. Ask someone to hold it for you, if possible. Clearly mark and make sure that you can see: Any grab bars or handrails. First and last steps. Where the edge of each step is. Use tools that help you move around (mobility aids) if they are needed. These include: Canes. Walkers. Scooters. Crutches. Turn  on the lights when you go into a dark area. Replace any light bulbs as soon as they burn out. Set up your furniture so you have a clear path. Avoid moving your furniture around. If any of your floors are uneven, fix them. If there are any pets around you, be aware of where they are. Review your medicines with your doctor. Some medicines can make you feel dizzy. This can increase your chance of falling. Ask your doctor what other things that you can do to help prevent falls. This information is not intended to replace advice given to you by your health care provider. Make sure you discuss any questions you have with your health care provider. Document Released: 09/07/2009 Document Revised: 04/18/2016 Document Reviewed: 12/16/2014 Elsevier Interactive Patient Education  2017 [employer].

## 2023-04-30 NOTE — Progress Notes (Signed)
Subjective:   [name] is a 72 y.o. female who presents for Medicare Annual (Subsequent) preventive examination. I connected with  [name] on 04/30/23 by a audio enabled telemedicine application and verified that I am speaking with the correct person using two identifiers.  Patient Location: Home  Provider Location: Home Office  I discussed the limitations of evaluation and management by telemedicine. The patient expressed understanding and agreed to proceed.  Review of Systems     Cardiac Risk Factors include: advanced age (>77men, >5 women);dyslipidemia     Objective:    Today's Vitals   04/30/23 1116  Weight: 176 lb (79.8 kg)  Height: 5\' 4"  (1.626 m)   Body mass index is 30.21 kg/m.     04/30/2023   11:18 AM 01/21/2023   12:40 PM 01/21/2023   12:36 PM 01/18/2023    8:14 PM 10/01/2021   11:23 AM 02/09/2021    2:26 PM 05/27/2020   10:55 PM  Advanced Directives  Does Patient Have a Medical Advance Directive? Yes No No No Yes Yes Yes  Type of Estate agent of [place];Living will    Healthcare Power of [employer];Living will Healthcare Power of [employer] of [place];Living will  Copy of Healthcare Power of Attorney in Chart? No - copy requested    No - copy requested No - copy requested   Would patient like information on creating a medical advance directive?  No - Patient declined No - Patient declined No - Patient declined       Current Medications (verified) Outpatient Encounter Medications as of 04/30/2023  Medication Sig   ALPRAZolam (XANAX) 0.5 MG tablet Take 1 tablet (0.5 mg total) by mouth 2 (two) times daily as needed for anxiety.   escitalopram (LEXAPRO) 20 MG tablet Take 1 tablet (20 mg total) by mouth daily.   Evolocumab (REPATHA SURECLICK) 140 MG/ML SOAJ Inject 150 mg into the skin every 14 (fourteen) days.   meclizine (ANTIVERT) 25 MG tablet Take 1 tablet (25 mg total) by mouth 3 (three) times daily as needed for  dizziness.   Polyethyl Glycol-Propyl Glycol (SYSTANE FREE OP) Place 1 drop into both eyes daily as needed (dry eyes).   amoxicillin (AMOXIL) 875 MG tablet Take 1 tablet (875 mg total) by mouth 2 (two) times daily. 1 po BID   No facility-administered encounter medications on file as of 04/30/2023.    Allergies (verified) Bee venom, Rosuvastatin, and Lipitor [atorvastatin]   History: History reviewed. No pertinent past medical history. Past Surgical History:  Procedure Laterality Date   BACK SURGERY     CESAREAN SECTION     KNEE SURGERY     Bilaterally   Family History  Problem Relation Age of Onset   Heart disease Mother    Hypertension Mother    Alcohol abuse Father    Breast cancer Neg Hx    Social History   Socioeconomic History   Marital status: Married    Spouse name: [name]   Number of children: 2   Years of education: Not on file   Highest education level: Associate degree: occupational, Scientist, product/process development, or vocational program  Occupational History   Not on file  Tobacco Use   Smoking status: Never   Smokeless tobacco: Never  Vaping Use   Vaping Use: Never used  Substance and Sexual Activity   Alcohol use: No   Drug use: Never   Sexual activity: Not on file  Other Topics Concern   Not  on file  Social History Narrative   5 grandchildren   Social Determinants of Health   Financial Resource Strain: Low Risk  (04/30/2023)   Overall Financial Resource Strain (CARDIA)    Difficulty of Paying Living Expenses: Not hard at all  Food Insecurity: No Food Insecurity (04/30/2023)   Hunger Vital Sign    Worried About Running Out of Food in the Last Year: Never true    Ran Out of Food in the Last Year: Never true  Transportation Needs: No Transportation Needs (03/30/2023)   PRAPARE - Administrator, Civil Service (Medical): No    Lack of Transportation (Non-Medical): No  Physical Activity: Insufficiently Active (04/30/2023)   Exercise Vital Sign    Days of Exercise  per Week: 3 days    Minutes of Exercise per Session: 30 min  Stress: No Stress Concern Present (04/30/2023)   [employer] of Occupational Health - Occupational Stress Questionnaire    Feeling of Stress : Not at all  Social Connections: Socially Integrated (04/30/2023)   Social Connection and Isolation Panel [NHANES]    Frequency of Communication with Friends and Family: More than three times a week    Frequency of Social Gatherings with Friends and Family: More than three times a week    Attends Religious Services: More than 4 times per year    Active Member of [employer] or Organizations: Yes    Attends Engineer, structural: More than 4 times per year    Marital Status: Married    Tobacco Counseling Counseling given: Not Answered   Clinical Intake:  Pre-visit preparation completed: Yes  Pain : No/denies pain     Nutritional Risks: None Diabetes: No  How often do you need to have someone help you when you read instructions, pamphlets, or other written materials from your doctor or pharmacy?: 1 - Never  Diabetic?no   Interpreter Needed?: No  Information entered by :: Janice Ora, LPN   Activities of Daily Living    04/30/2023   11:18 AM 04/29/2023    2:14 PM  In your present state of health, do you have any difficulty performing the following activities:  Hearing? 0 0  Vision? 0 0  Difficulty concentrating or making decisions? 0 0  Walking or climbing stairs? 0 0  Dressing or bathing? 0 0  Doing errands, shopping? 0 0  Preparing Food and eating ? N N  Using the Toilet? N N  In the past six months, have you accidently leaked urine? N Y  Do you have problems with loss of bowel control? N N  Managing your Medications? N N  Managing your Finances? N N  Housekeeping or managing your Housekeeping? N N    Patient Care Team: Janice Pierini, FNP as PCP - General (Family Medicine) [name], [facility] as Pharmacist (Family Medicine)  Indicate any recent  Medical Services you may have received from other than [facility] providers in the past year (date may be approximate).     Assessment:   This is a routine wellness examination for [name].  Hearing/Vision screen Vision Screening - Comments:: Wears rx glasses - up to date with routine eye exams with  Dr.Lee   Dietary issues and exercise activities discussed: Current Exercise Habits: Home exercise routine, Type of exercise: walking, Time (Minutes): 30, Frequency (Times/Week): 3, Weekly Exercise (Minutes/Week): 90, Intensity: Mild, Exercise limited by: None identified   Goals Addressed  This Visit's Progress    DIET - INCREASE WATER INTAKE         Depression Screen    04/30/2023   11:17 AM 03/31/2023    9:52 AM 01/28/2023    3:24 PM 12/30/2022   11:48 AM 09/30/2022    9:05 AM 07/22/2022   11:04 AM 10/24/2021    8:49 AM  PHQ 2/9 Scores  PHQ - 2 Score 0 0 0 0 0 0 0  PHQ- 9 Score 0 6 0 0 6 0 0    Fall Risk    04/30/2023   11:16 AM 04/29/2023    2:14 PM 03/31/2023    9:52 AM 01/28/2023    3:24 PM 12/30/2022   11:48 AM  Fall Risk   Falls in the past year? 0 1 1 0 0  Number falls in past yr: 0 0 0    Injury with Fall? 0 0 0    Risk for fall due to : No Fall Risks  History of fall(s)    Follow up Falls prevention discussed  Education provided      FALL RISK PREVENTION PERTAINING TO THE HOME:  Any stairs in or around the home? No  If so, are there any without handrails? No  Home free of loose throw rugs in walkways, pet beds, electrical cords, etc? Yes  Adequate lighting in your home to reduce risk of falls? Yes   ASSISTIVE DEVICES UTILIZED TO PREVENT FALLS:  Life alert? No  Use of a cane, walker or w/c? No  Grab bars in the bathroom? Yes  Shower chair or bench in shower? Yes  Elevated toilet seat or a handicapped toilet? Yes        04/30/2023   11:19 AM 10/01/2021   11:25 AM  6CIT Screen  What Year? 0 points 0 points  What month? 0 points 0 points  What time? 0 points 0  points  Count back from 20 0 points 0 points  Months in reverse 0 points 2 points  Repeat phrase 0 points 0 points  Total Score 0 points 2 points    Immunizations Immunization History  Administered Date(s) Administered   Influenza, Quadrivalent, Recombinant, Inj, Pf 08/30/2019   Influenza-Unspecified 10/12/1997, 10/23/2000, 12/19/2003, 11/12/2004, 10/22/2005, 09/22/2006, 09/25/2007, 08/11/2013   Moderna SARS-COV2 Booster Vaccination 09/19/2020   Moderna Sars-Covid-2 Vaccination 01/06/2020, 02/04/2020   Pneumococcal Conjugate-13 05/21/2019   Pneumococcal Polysaccharide-23 10/12/1997   Td 09/02/1996, 08/11/2013   Tdap 08/11/2013, 02/09/2021    TDAP status: Up to date  Flu Vaccine status: Declined, Education has been provided regarding the importance of this vaccine but patient still declined. Advised may receive this vaccine at local pharmacy or Health Dept. Aware to provide a copy of the vaccination record if obtained from local pharmacy or Health Dept. Verbalized acceptance and understanding.  Pneumococcal vaccine status: Up to date  Covid-19 vaccine status: Completed vaccines  Qualifies for Shingles Vaccine? Yes   Zostavax completed No   Shingrix Completed?: No.    Education has been provided regarding the importance of this vaccine. Patient has been advised to call insurance company to determine out of pocket expense if they have not yet received this vaccine. Advised may also receive vaccine at local pharmacy or Health Dept. Verbalized acceptance and understanding.  Screening Tests Health Maintenance  Topic Date Due   Hepatitis C Screening  Never done   COVID-19 Vaccine (3 - Moderna risk series) 10/17/2020   MAMMOGRAM  11/06/2022   Colonoscopy  03/31/2023  Zoster Vaccines- Shingrix (1 of 2) 04/30/2023 (Originally 10/19/1970)   Fecal DNA (Cologuard)  06/15/2023   INFLUENZA VACCINE  06/26/2023   Medicare Annual Wellness (AWV)  04/29/2024   Pneumonia Vaccine 2+ Years old  (3 of 3 - PPSV23 or PCV20) 05/20/2024   DTaP/Tdap/Td (5 - Td or Tdap) 02/10/2031   DEXA SCAN  Completed   HPV VACCINES  Aged Out    Health Maintenance  Health Maintenance Due  Topic Date Due   Hepatitis C Screening  Never done   COVID-19 Vaccine (3 - Moderna risk series) 10/17/2020   MAMMOGRAM  11/06/2022   Colonoscopy  03/31/2023    Colorectal cancer screening: Type of screening: Cologuard. Completed 06/14/2020. Repeat every 3 years  Mammogram status: Ordered 04/30/2023. Pt provided with contact info and advised to call to schedule appt.   Bone Density status: Completed 11/15/2021. Results reflect: Bone density results: OSTEOPENIA. Repeat every 5 years.  Lung Cancer Screening: (Low Dose CT Chest recommended if Age 31-80 years, 30 pack-year currently smoking OR have quit w/in 15years.) does not qualify.   Lung Cancer Screening Referral: n/a  Additional Screening:  Hepatitis C Screening: does not qualify;   Vision Screening: Recommended annual ophthalmology exams for early detection of glaucoma and other disorders of the eye. Is the patient up to date with their annual eye exam?  Yes  Who is the provider or what is the name of the office in which the patient attends annual eye exams? Dr.Lee  If pt is not established with a provider, would they like to be referred to a provider to establish care? No .   Dental Screening: Recommended annual dental exams for proper oral hygiene  Community Resource Referral / Chronic Care Management: CRR required this visit?  No   CCM required this visit?  No      Plan:     I have personally reviewed and noted the following in the patient's chart:   Medical and social history Use of alcohol, tobacco or illicit drugs  Current medications and supplements including opioid prescriptions. Patient is not currently taking opioid prescriptions. Functional ability and status Nutritional status Physical activity Advanced directives List of  other physicians Hospitalizations, surgeries, and ER visits in previous 12 months Vitals Screenings to include cognitive, depression, and falls Referrals and appointments  In addition, I have reviewed and discussed with patient certain preventive protocols, quality metrics, and best practice recommendations. A written personalized care plan for preventive services as well as general preventive health recommendations were provided to patient.     Lorrene Reid, LPN   11/30/1094   Nurse Notes: none

## 2023-05-05 ENCOUNTER — Encounter: Payer: Self-pay | Admitting: Nurse Practitioner

## 2023-05-08 ENCOUNTER — Ambulatory Visit
Admission: RE | Admit: 2023-05-08 | Discharge: 2023-05-08 | Disposition: A | Discharge status: Home / Self Care | Payer: BC Managed Care – PPO | Source: Ambulatory Visit | Attending: Family Medicine | Admitting: Family Medicine

## 2023-05-08 DIAGNOSIS — Z1231 Encounter for screening mammogram for malignant neoplasm of breast: Secondary | ICD-10-CM

## 2023-05-22 DIAGNOSIS — H524 Presbyopia: Secondary | ICD-10-CM | POA: Diagnosis not present

## 2023-05-22 DIAGNOSIS — H52223 Regular astigmatism, bilateral: Secondary | ICD-10-CM | POA: Diagnosis not present

## 2023-05-28 ENCOUNTER — Ambulatory Visit (HOSPITAL_BASED_OUTPATIENT_CLINIC_OR_DEPARTMENT_OTHER): Payer: Medicare HMO | Admitting: Pulmonary Disease

## 2023-05-28 ENCOUNTER — Encounter (HOSPITAL_BASED_OUTPATIENT_CLINIC_OR_DEPARTMENT_OTHER): Payer: Self-pay | Admitting: Pulmonary Disease

## 2023-05-28 VITALS — BP 132/64 | HR 85 | Temp 98.4°F | Ht 65.0 in | Wt 175.2 lb

## 2023-05-28 DIAGNOSIS — J42 Unspecified chronic bronchitis: Secondary | ICD-10-CM | POA: Diagnosis not present

## 2023-05-28 MED ORDER — BREZTRI AEROSPHERE 160-9-4.8 MCG/ACT IN AERO: 2.0000 | INHALATION_SPRAY | Freq: Two times a day (BID) | RESPIRATORY_TRACT | 0 refills | Status: AC

## 2023-05-28 NOTE — Patient Instructions (Signed)
Chronic bronchitis - symptom free --Provide two week Breztri sample when acute symptoms shortness of breath, cough, wheezing. TWO puffs in the morning and TWO puffs at night. Rinse out mouth out with use.  --May need steroid taper if symptoms are severe

## 2023-05-28 NOTE — Progress Notes (Signed)
Subjective:    Patient ID: [name], female    DOB: [birth year], 72 y.o.   MRN: [id number]  HPI  Chief Complaint  Patient presents with   Follow-up    Follow up. No complaints.    02/19/23 72 year old female never smoker followed by Dr. Vassie Loll. Recently established care after recent hospitalization for pneumonia. Note reviewed from 02/03/23 with nearly resolved left infiltrate but still had persistent symptoms of productive cough so treated with Augmentin. She was starting to improve on the antibiotics her productive cough improved by 50% however restarted after completing it. Worsening cough and wheezing especially at night. Reports chills. No fevers.    Significant tests/ events reviewed  CTA chest 01/21/23  Increase in consolidation in the right lower lobe and lingula favoring multilobar pneumonia. There is also some new hazy ground-glass opacity and linear opacity in the left lower lobe Mild increase in size of right paratracheal, subcarinal, and right infrahilar lymph nodes likely reactive CXR 02/19/23 - no   05/28/23 Since our last visit she has come off inhaler and cough syrup. She still has some allergy symptoms during May/June and October. Starting to improve. Triggered by hay. Her last episode was prolonged and required hospitalization in Feb.  History reviewed. No pertinent past medical history.   Past Surgical History:  Procedure Laterality Date   BACK SURGERY     CESAREAN SECTION     KNEE SURGERY     Bilaterally   Allergies  Allergen Reactions   Bee Venom Swelling   Rosuvastatin Other (See Comments)    myopathy   Lipitor [Atorvastatin] Other (See Comments)    myopathy      Social History   Socioeconomic History   Marital status: Married    Spouse name: Freida Busman   Number of children: 2   Years of education: Not on file   Highest education level: Associate degree: occupational, Scientist, product/process development, or vocational program  Occupational History   Not on file  Tobacco  Use   Smoking status: Never   Smokeless tobacco: Never  Vaping Use   Vaping Use: Never used  Substance and Sexual Activity   Alcohol use: No   Drug use: Never   Sexual activity: Not on file  Other Topics Concern   Not on file  Social History Narrative   5 grandchildren   Social Determinants of Health   Financial Resource Strain: Low Risk  (04/30/2023)   Overall Financial Resource Strain (CARDIA)    Difficulty of Paying Living Expenses: Not hard at all  Food Insecurity: No Food Insecurity (04/30/2023)   Hunger Vital Sign    Worried About Running Out of Food in the Last Year: Never true    Ran Out of Food in the Last Year: Never true  Transportation Needs: No Transportation Needs (03/30/2023)   PRAPARE - Administrator, Civil Service (Medical): No    Lack of Transportation (Non-Medical): No  Physical Activity: Insufficiently Active (04/30/2023)   Exercise Vital Sign    Days of Exercise per Week: 3 days    Minutes of Exercise per Session: 30 min  Stress: No Stress Concern Present (04/30/2023)   [employer] of Occupational Health - Occupational Stress Questionnaire    Feeling of Stress : Not at all  Social Connections: Socially Integrated (04/30/2023)   Social Connection and Isolation Panel [NHANES]    Frequency of Communication with Friends and Family: More than three times a week    Frequency of Social  Gatherings with Friends and Family: More than three times a week    Attends Religious Services: More than 4 times per year    Active Member of Clubs or Organizations: Yes    Attends Engineer, structural: More than 4 times per year    Marital Status: Married  Catering manager Violence: Not At Risk (04/30/2023)   Humiliation, Afraid, Rape, and Kick questionnaire    Fear of Current or Ex-Partner: No    Emotionally Abused: No    Physically Abused: No    Sexually Abused: No    Family History  Problem Relation Age of Onset   Heart disease Mother    Hypertension  Mother    Alcohol abuse Father    Breast cancer Neg Hx      Review of Systems  Constitutional:  Negative for chills, diaphoresis and fever.  HENT:  Negative for congestion.   Respiratory:  Negative for cough, shortness of breath and wheezing.   Cardiovascular:  Negative for chest pain, palpitations and leg swelling.  Allergic/Immunologic: Positive for environmental allergies.      Objective:   Physical Exam: General: Well-appearing, no acute distress HENT: [place], AT Eyes: EOMI, no scleral icterus Respiratory: Clear to auscultation bilaterally.  No crackles, wheezing or rales Cardiovascular: RRR, -M/R/G, no JVD Extremities:-Edema,-tenderness Neuro: AAO x4, CNII-XII grossly intact Psych: Normal mood, normal affect      Assessment & Plan:   Chronic bronchitis - symptom free --Provide two week Breztri sample when acute symptoms shortness of breath, cough, wheezing. TWO puffs in the morning and TWO puffs at night. Rinse out mouth out with use.  --May need steroid taper if symptoms are severe  I have spent a total time of 20-minutes on the day of the appointment including chart review, data review, collecting history, coordinating care and discussing medical diagnosis and plan with the patient/family. Past medical history, allergies, medications were reviewed. Pertinent imaging, labs and tests included in this note have been reviewed and interpreted independently by me.   Mechele Collin, MD [facility] Pulmonary/Critical Care Medicine 05/28/2023 1:46 PM

## 2023-05-31 DIAGNOSIS — J42 Unspecified chronic bronchitis: Secondary | ICD-10-CM | POA: Insufficient documentation

## 2023-07-11 ENCOUNTER — Encounter (HOSPITAL_BASED_OUTPATIENT_CLINIC_OR_DEPARTMENT_OTHER): Payer: Self-pay | Admitting: Pulmonary Disease

## 2023-07-17 ENCOUNTER — Other Ambulatory Visit: Payer: Self-pay | Admitting: Medical Genetics

## 2023-07-17 DIAGNOSIS — Z006 Encounter for examination for normal comparison and control in clinical research program: Secondary | ICD-10-CM

## 2023-07-21 ENCOUNTER — Other Ambulatory Visit (HOSPITAL_COMMUNITY)
Admission: RE | Admit: 2023-07-21 | Discharge: 2023-07-21 | Disposition: A | Discharge status: Home / Self Care | Payer: BC Managed Care – PPO | Source: Ambulatory Visit | Attending: Oncology | Admitting: Oncology

## 2023-07-21 DIAGNOSIS — Z006 Encounter for examination for normal comparison and control in clinical research program: Secondary | ICD-10-CM | POA: Insufficient documentation

## 2023-07-29 LAB — GENECONNECT MOLECULAR SCREEN: Genetic Analysis Overall Interpretation: NEGATIVE

## 2023-09-30 ENCOUNTER — Encounter: Payer: Self-pay | Admitting: Nurse Practitioner

## 2023-09-30 ENCOUNTER — Ambulatory Visit (INDEPENDENT_AMBULATORY_CARE_PROVIDER_SITE_OTHER): Payer: Medicare HMO | Admitting: Nurse Practitioner

## 2023-09-30 VITALS — BP 122/77 | HR 74 | Temp 97.9°F | Resp 20 | Ht 65.0 in | Wt 170.0 lb

## 2023-09-30 DIAGNOSIS — F411 Generalized anxiety disorder: Secondary | ICD-10-CM | POA: Diagnosis not present

## 2023-09-30 DIAGNOSIS — E8881 Metabolic syndrome: Secondary | ICD-10-CM | POA: Diagnosis not present

## 2023-09-30 DIAGNOSIS — E782 Mixed hyperlipidemia: Secondary | ICD-10-CM

## 2023-09-30 DIAGNOSIS — Z6830 Body mass index (BMI) 30.0-30.9, adult: Secondary | ICD-10-CM | POA: Diagnosis not present

## 2023-09-30 MED ORDER — ESCITALOPRAM OXALATE 20 MG PO TABS: 20.0000 mg | ORAL_TABLET | Freq: Every day | ORAL | 5 refills | Status: DC

## 2023-09-30 NOTE — Patient Instructions (Signed)

## 2023-09-30 NOTE — Progress Notes (Signed)
Subjective:    Patient ID: [name], female    DOB: 1951/11/18, 72 y.o.   MRN: [id number]   Chief Complaint: medical management of chronic issues     HPI:  [name] is a 72 y.o. who identifies as a female who was assigned female at birth.   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 Mixed hyperlipidemia Does try to watch diet and stays very active. She is now on repatha. Lab Results  Component Value Date   CHOL 253 (H) 03/31/2023   HDL 40 03/31/2023   LDLCALC 170 (H) 03/31/2023   TRIG 229 (H) 03/31/2023   CHOLHDL 6.3 (H) 03/31/2023   The 10-year ASCVD risk score (Arnett DK, et al., 2019) is: 9.1%   2. Metabolic syndrome Does try to watch diet. Does not check blood sugars at home. Lab Results  Component Value Date   HGBA1C 5.3 03/31/2023     3. GAD (generalized anxiety disorder) Is on xanax as needed. Takes 3-4 x a week    09/30/2023    9:15 AM 03/31/2023    9:53 AM 01/28/2023    3:25 PM 12/30/2022   11:49 AM  GAD 7 : Generalized Anxiety Score  Nervous, Anxious, on Edge 0 0 0 0  Control/stop worrying 0 0 0 0  Worry too much - different things 0 0 0 0  Trouble relaxing 2 2 0 0  Restless 1 0 0 0  Easily annoyed or irritable 1 1 0 0  Afraid - awful might happen 0 0 0 0  Total GAD 7 Score 4 3 0 0  Anxiety Difficulty Somewhat difficult Somewhat difficult Not difficult at all Not difficult at all      4. BMI 30.0-30.9,adult Weight is down 5 lbs Wt Readings from Last 3 Encounters:  09/30/23 170 lb (77.1 kg)  05/28/23 175 lb 3.2 oz (79.5 kg)  04/30/23 176 lb (79.8 kg)   BMI Readings from Last 3 Encounters:  09/30/23 28.29 kg/m  05/28/23 29.15 kg/m  04/30/23 30.21 kg/m      New complaints: None today  Allergies  Allergen Reactions   Bee Venom Swelling   Rosuvastatin Other (See Comments)    myopathy   Lipitor  [Atorvastatin] Other (See Comments)    myopathy   Outpatient Encounter Medications as of 09/30/2023  Medication Sig   ALPRAZolam (XANAX) 0.5 MG tablet Take 1 tablet (0.5 mg total) by mouth 2 (two) times daily as needed for anxiety.   Budeson-Glycopyrrol-Formoterol (BREZTRI AEROSPHERE) 160-9-4.8 MCG/ACT AERO Inhale 2 puffs into the lungs in the morning and at bedtime.   escitalopram (LEXAPRO) 20 MG tablet Take 1 tablet (20 mg total) by mouth daily.   Evolocumab (REPATHA SURECLICK) 140 MG/ML SOAJ Inject 150 mg into the skin every 14 (fourteen) days.   meclizine (ANTIVERT) 25 MG tablet Take 1 tablet (25 mg total) by mouth 3 (three) times daily as needed for dizziness.   Polyethyl Glycol-Propyl Glycol (SYSTANE FREE OP) Place 1 drop into both eyes daily as needed (dry eyes).   No facility-administered encounter medications on file as of 09/30/2023.    Past Surgical History:  Procedure Laterality Date   BACK SURGERY     CESAREAN SECTION     KNEE SURGERY     Bilaterally    Family History  Problem Relation Age of Onset   Heart  disease Mother    Hypertension Mother    Alcohol abuse Father    Breast cancer Neg Hx       Controlled substance contract: n/a     Review of Systems  Constitutional:  Negative for diaphoresis.  Eyes:  Negative for pain.  Respiratory:  Negative for shortness of breath.   Cardiovascular:  Negative for chest pain, palpitations and leg swelling.  Gastrointestinal:  Negative for abdominal pain.  Endocrine: Negative for polydipsia.  Skin:  Negative for rash.  Neurological:  Negative for dizziness, weakness and headaches.  Hematological:  Does not bruise/bleed easily.  All other systems reviewed and are negative.      Objective:   Physical Exam Vitals and nursing note reviewed.  Constitutional:      General: She is not in acute distress.    Appearance: Normal appearance. She is well-developed.  HENT:     Head: Normocephalic.     Right Ear: Tympanic  membrane normal.     Left Ear: Tympanic membrane normal.     Nose: Nose normal.     Mouth/Throat:     Mouth: Mucous membranes are moist.  Eyes:     Pupils: Pupils are equal, round, and reactive to light.  Neck:     Vascular: No carotid bruit or JVD.  Cardiovascular:     Rate and Rhythm: Normal rate and regular rhythm.     Heart sounds: Normal heart sounds.  Pulmonary:     Effort: Pulmonary effort is normal. No respiratory distress.     Breath sounds: Normal breath sounds. No wheezing or rales.  Chest:     Chest wall: No tenderness.  Abdominal:     General: Bowel sounds are normal. There is no distension or abdominal bruit.     Palpations: Abdomen is soft. There is no hepatomegaly, splenomegaly, mass or pulsatile mass.     Tenderness: There is no abdominal tenderness.  Musculoskeletal:        General: Normal range of motion.     Cervical back: Normal range of motion and neck supple.  Lymphadenopathy:     Cervical: No cervical adenopathy.  Skin:    General: Skin is warm and dry.  Neurological:     Mental Status: She is alert and oriented to person, place, and time.     Deep Tendon Reflexes: Reflexes are normal and symmetric.  Psychiatric:        Behavior: Behavior normal.        Thought Content: Thought content normal.        Judgment: Judgment normal.    BP 122/77   Pulse 74   Temp 97.9 F (36.6 C) (Temporal)   Resp 20   Ht 5\' 5"  (1.651 m)   Wt 170 lb (77.1 kg)   SpO2 95%   BMI 28.29 kg/m         Assessment & Plan:   [name] comes in today with chief complaint of Medical Management of Chronic Issues   Diagnosis and orders addressed:  1. Mixed hyperlipidemia Low fat diet - CBC with Differential/Platelet - CMP14+EGFR - Lipid panel  2. Metabolic syndrome Watch carbs in diet  3. GAD (generalized anxiety disorder) Stress management - escitalopram (LEXAPRO) 20 MG tablet; Take 1 tablet (20 mg total) by mouth daily.  Dispense: 30 tablet; Refill:  5  4. BMI 30.0-30.9,adult Discussed diet and exercise for person with BMI >25 Will recheck weight in 3-6 months    Labs pending Health Maintenance reviewed  Diet and exercise encouraged  Follow up plan: 6 months   Mary-Margaret Daphine Deutscher, FNP

## 2023-10-01 LAB — CMP14+EGFR
ALT: 33 IU/L — ABNORMAL HIGH (ref 0–32)
AST: 41 IU/L — ABNORMAL HIGH (ref 0–40)
Albumin: 4.3 g/dL (ref 3.9–4.9)
Alkaline Phosphatase: 166 IU/L — ABNORMAL HIGH (ref 44–121)
BUN/Creatinine Ratio: 19 (ref 12–28)
BUN: 14 mg/dL (ref 8–27)
Bilirubin Total: 0.6 mg/dL (ref 0.0–1.2)
CO2: 20 mmol/L (ref 20–29)
Calcium: 9.5 mg/dL (ref 8.7–10.3)
Chloride: 104 mmol/L (ref 96–106)
Creatinine, Ser: 0.75 mg/dL (ref 0.57–1.00)
Globulin, Total: 2.6 g/dL (ref 1.5–4.5)
Glucose: 94 mg/dL (ref 70–99)
Potassium: 4.5 mmol/L (ref 3.5–5.2)
Sodium: 143 mmol/L (ref 134–144)
Total Protein: 6.9 g/dL (ref 6.0–8.5)
eGFR: 86 mL/min/{1.73_m2} (ref >59)

## 2023-10-01 LAB — CBC WITH DIFFERENTIAL/PLATELET
Basophils Absolute: 0.1 10*3/uL (ref 0.0–0.2)
Basos: 1 %
EOS (ABSOLUTE): 0.4 10*3/uL (ref 0.0–0.4)
Eos: 6 %
Hematocrit: 40.7 % (ref 34.0–46.6)
Hemoglobin: 13.2 g/dL (ref 11.1–15.9)
Immature Grans (Abs): 0 10*3/uL (ref 0.0–0.1)
Immature Granulocytes: 0 %
Lymphocytes Absolute: 2.1 10*3/uL (ref 0.7–3.1)
Lymphs: 35 %
MCH: 29.9 pg (ref 26.6–33.0)
MCHC: 32.4 g/dL (ref 31.5–35.7)
MCV: 92 fL (ref 79–97)
Monocytes Absolute: 0.3 10*3/uL (ref 0.1–0.9)
Monocytes: 5 %
Neutrophils Absolute: 3.2 10*3/uL (ref 1.4–7.0)
Neutrophils: 53 %
Platelets: 170 10*3/uL (ref 150–450)
RBC: 4.41 x10E6/uL (ref 3.77–5.28)
RDW: 14.2 % (ref 11.7–15.4)
WBC: 6.1 10*3/uL (ref 3.4–10.8)

## 2023-10-01 LAB — LIPID PANEL
Chol/HDL Ratio: 4.2 ratio (ref 0.0–4.4)
Cholesterol, Total: 192 mg/dL (ref 100–199)
HDL: 46 mg/dL (ref >39)
LDL Chol Calc (NIH): 117 mg/dL — ABNORMAL HIGH (ref 0–99)
Triglycerides: 167 mg/dL — ABNORMAL HIGH (ref 0–149)
VLDL Cholesterol Cal: 29 mg/dL (ref 5–40)

## 2023-10-24 ENCOUNTER — Other Ambulatory Visit: Payer: Self-pay | Admitting: Nurse Practitioner

## 2023-10-24 DIAGNOSIS — E782 Mixed hyperlipidemia: Secondary | ICD-10-CM

## 2023-10-26 ENCOUNTER — Other Ambulatory Visit: Payer: Self-pay | Admitting: Nurse Practitioner

## 2023-10-26 DIAGNOSIS — F411 Generalized anxiety disorder: Secondary | ICD-10-CM

## 2023-10-27 NOTE — Telephone Encounter (Signed)
Refilled 03/31/23, #60, 2 refills Last office visit 09/30/23

## 2024-01-20 ENCOUNTER — Other Ambulatory Visit (HOSPITAL_COMMUNITY): Payer: Self-pay

## 2024-01-20 ENCOUNTER — Telehealth: Payer: Self-pay

## 2024-01-20 NOTE — Telephone Encounter (Signed)
 Pharmacy Patient Advocate Encounter   Received notification from CoverMyMeds that prior authorization for Repatha SureClick 140MG /ML auto-injectors is required/requested.   Insurance verification completed.   The patient is insured through [employer] .   Per test claim: PA required; PA submitted to above mentioned insurance via CoverMyMeds Key/confirmation #/EOC [id number] Status is pending

## 2024-01-20 NOTE — Telephone Encounter (Signed)
 Pharmacy Patient Advocate Encounter  Received notification from [facility] that Prior Authorization for Repatha SureClick 140MG /ML auto-injectors has been APPROVED from 01/20/24 to 11/24/24. Ran test claim, Copay is $80. This test claim was processed through [facility]- copay amounts may vary at other pharmacies due to pharmacy/plan contracts, or as the patient moves through the different stages of their insurance plan.   PA #/Case ID/Reference #: [id number]

## 2024-01-27 ENCOUNTER — Telehealth: Payer: Self-pay

## 2024-01-27 ENCOUNTER — Other Ambulatory Visit: Payer: Self-pay

## 2024-01-27 ENCOUNTER — Telehealth: Payer: Self-pay | Admitting: Pharmacist

## 2024-01-27 NOTE — Telephone Encounter (Signed)
  Patient re-enrolled through [employer] grant for [employer] given to daughter

## 2024-01-27 NOTE — Telephone Encounter (Signed)
 Grant for Repatha has ran out. Can this be redone? Please advise

## 2024-02-03 IMAGING — DX DG MANDIBLE 4+V
5 series · 5 of 5 positions shown · non-contrast
Comparison: None Available.

CLINICAL DATA: Jaw pain.

EXAM:
MANDIBLE - 4+ VIEW

[mandible pa]
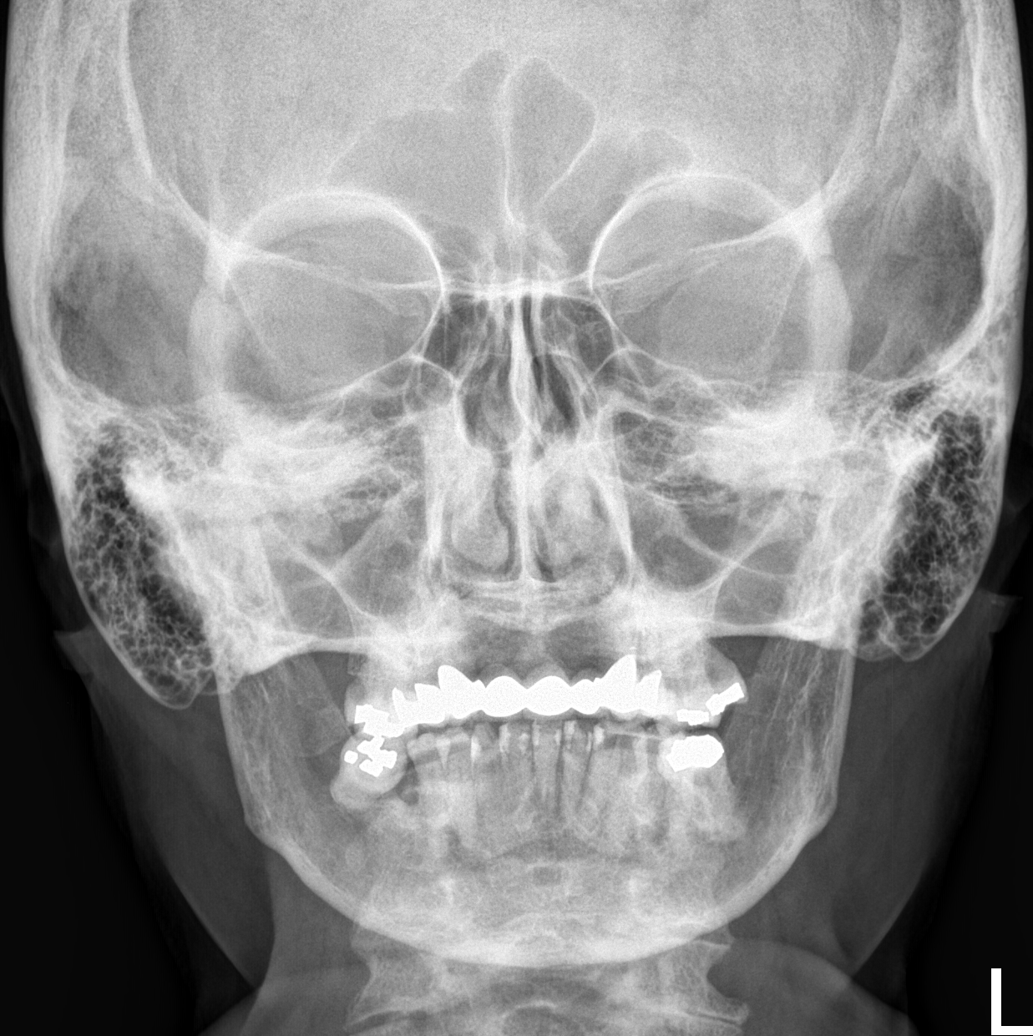

[mandible obl (1 of 2)]
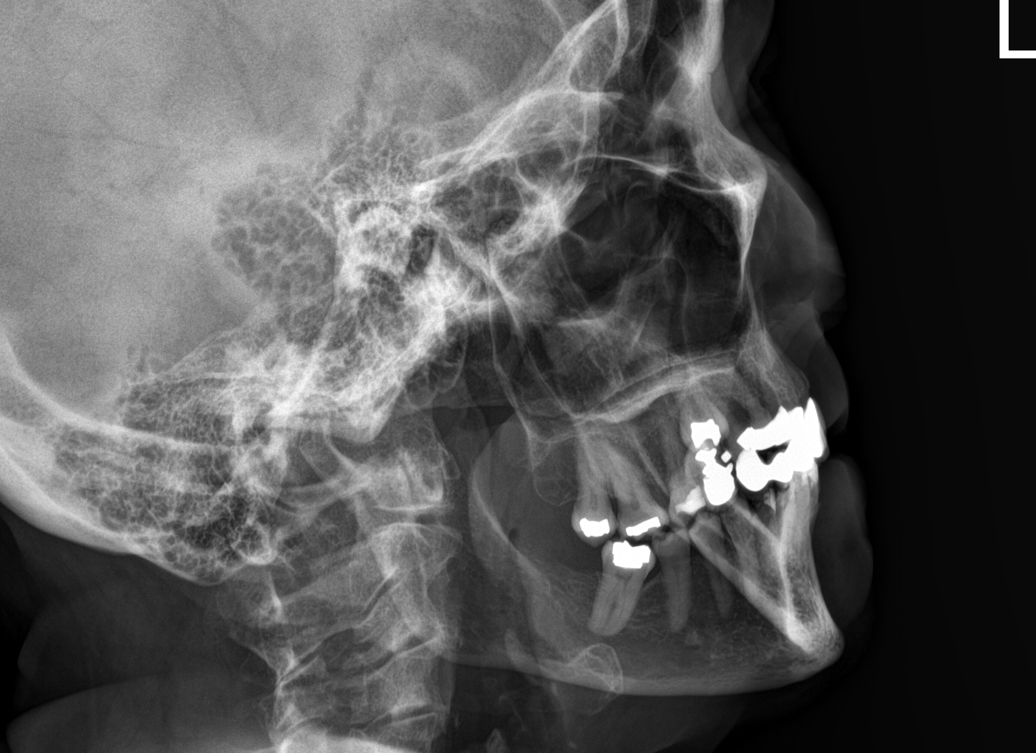

[mandible obl (2 of 2)]
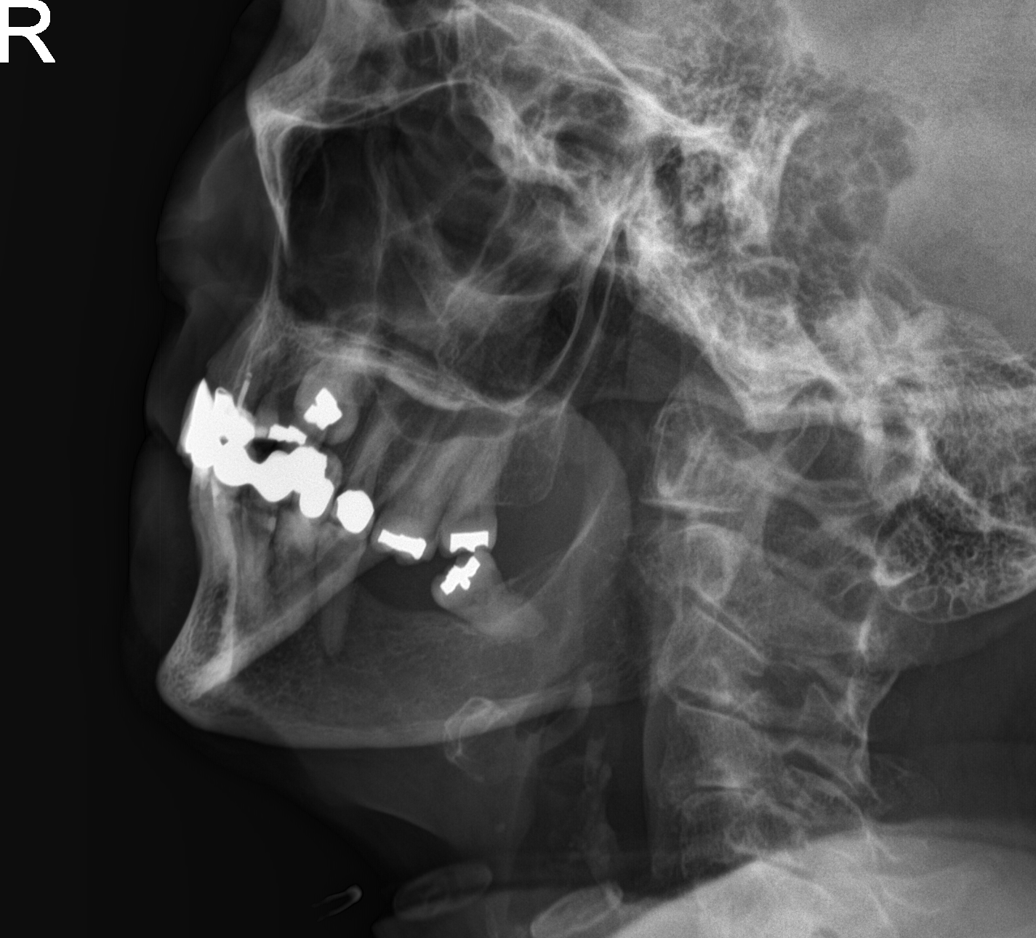

[mandible lat]
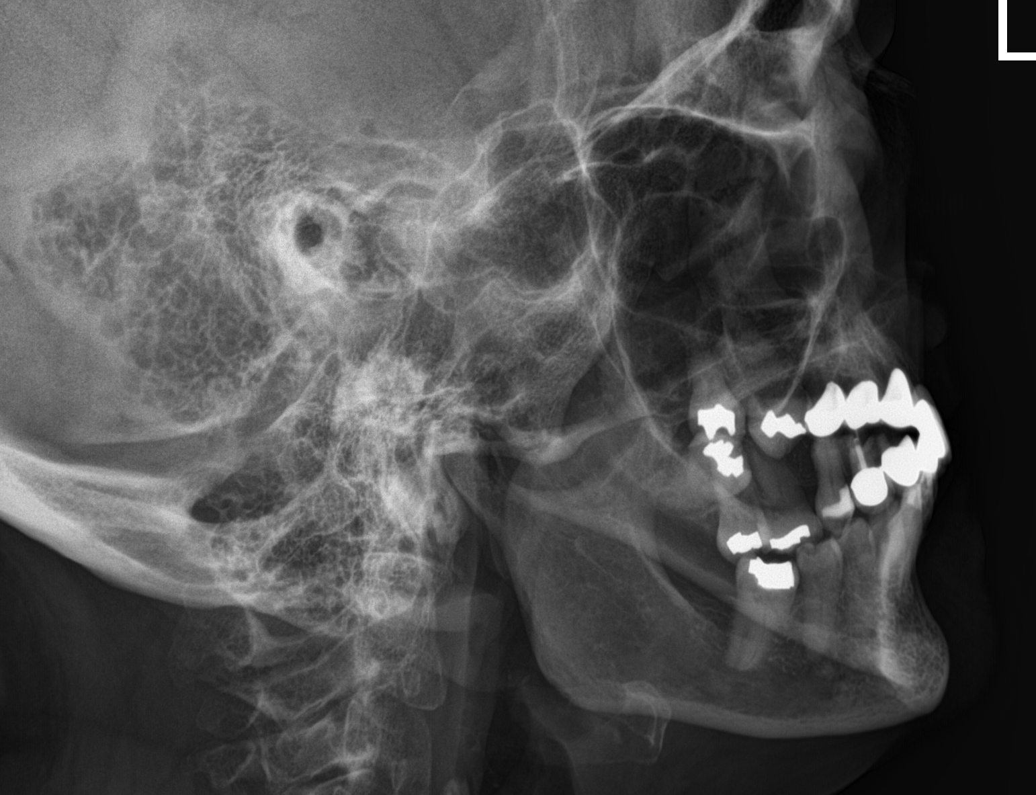

[skull [place]]
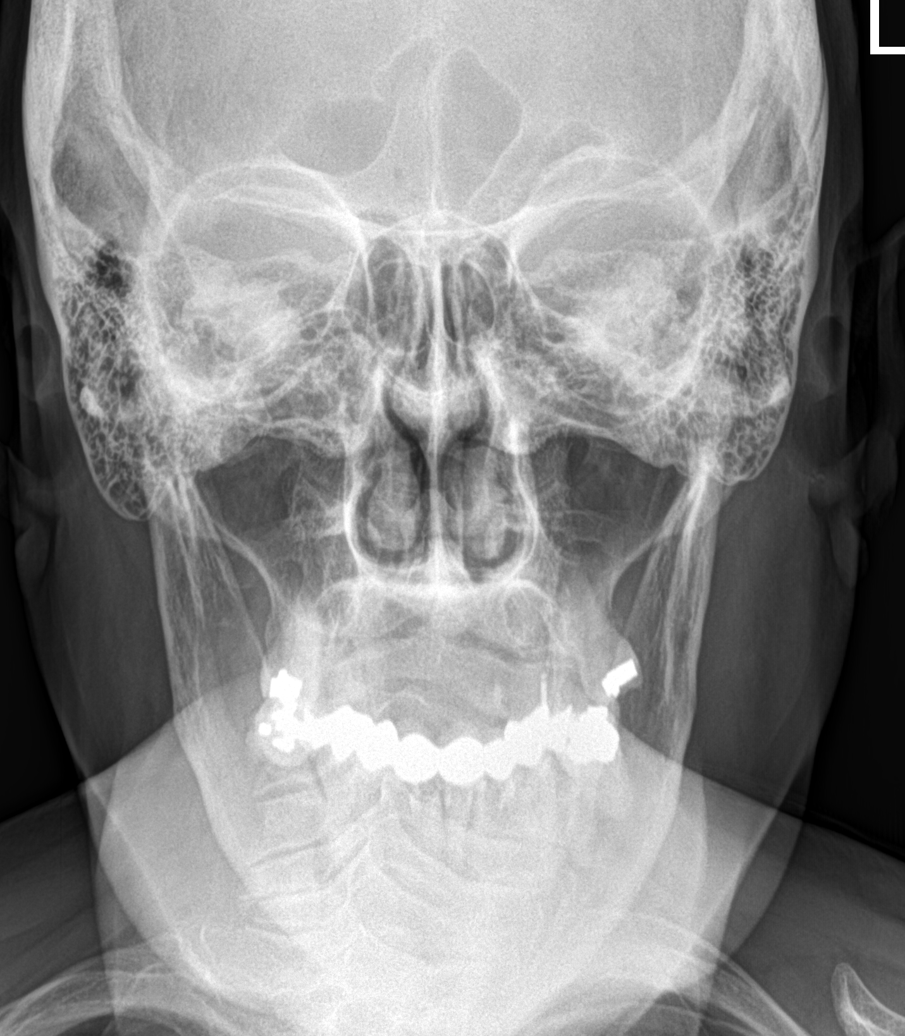

[5 of 5 positions shown; findings below may reference images not displayed]

FINDINGS: There is no evidence of fracture or other focal bone lesions.
IMPRESSION: Negative.

## 2024-02-05 ENCOUNTER — Ambulatory Visit (HOSPITAL_BASED_OUTPATIENT_CLINIC_OR_DEPARTMENT_OTHER): Payer: Medicare PPO | Admitting: Pulmonary Disease

## 2024-02-05 ENCOUNTER — Encounter (HOSPITAL_BASED_OUTPATIENT_CLINIC_OR_DEPARTMENT_OTHER): Payer: Self-pay | Admitting: Pulmonary Disease

## 2024-02-05 VITALS — BP 120/74 | HR 83 | Ht 65.0 in | Wt 179.2 lb

## 2024-02-05 DIAGNOSIS — J42 Unspecified chronic bronchitis: Secondary | ICD-10-CM

## 2024-02-05 NOTE — Progress Notes (Signed)
   Subjective:    Patient ID: [name], female    DOB: 04/15/1951, 73 y.o.   MRN: [id number]  HPI  Chief Complaint  Patient presents with   Follow-up    Chronic bronchitis   02/19/23 73 year old female never smoker previously followed by Dr. Vassie Loll. Recently established care with after recent hospitalization for pneumonia. Note reviewed from 02/03/23 with nearly resolved left infiltrate but still had persistent symptoms of productive cough so treated with Augmentin. She was starting to improve on the antibiotics her productive cough improved by 50% however restarted after completing it. Worsening cough and wheezing especially at night. Reports chills. No fevers.   05/28/23 Since our last visit she has come off inhaler and cough syrup. She still has some allergy symptoms during May/June and October. Starting to improve. Triggered by hay. Her last episode was prolonged and required hospitalization in Feb.  02/05/24 So far this is her best year. No respiratory infections. Not currently on maintenance inhalers. Denies shortness of breath, cough or wheezing. Tried to get RSV vaccine but [facility] pharmacy stated she was not of age.   History reviewed. No pertinent past medical history.   Past Surgical History:  Procedure Laterality Date   BACK SURGERY     CESAREAN SECTION     KNEE SURGERY     Bilaterally   Allergies  Allergen Reactions   Bee Venom Swelling   Rosuvastatin Other (See Comments)    myopathy   Lipitor [Atorvastatin] Other (See Comments)    myopathy    Review of Systems  Constitutional:  Negative for chills, diaphoresis and fever.  HENT:  Negative for congestion.   Respiratory:  Negative for cough, shortness of breath and wheezing.   Cardiovascular:  Negative for chest pain, palpitations and leg swelling.  Allergic/Immunologic: Positive for environmental allergies.      Objective:   Physical Exam: General: Well-appearing, no acute distress HENT: [place], AT Eyes: EOMI,  no scleral icterus Respiratory: Clear to auscultation bilaterally.  No crackles, wheezing or rales Cardiovascular: RRR, -M/R/G, no JVD Extremities:-Edema,-tenderness Neuro: AAO x4, CNII-XII grossly intact Psych: Normal mood, normal affect    CTA chest 01/21/23  Increase in consolidation in the right lower lobe and lingula favoring multilobar pneumonia. There is also some new hazy ground-glass opacity and linear opacity in the left lower lobe Mild increase in size of right paratracheal, subcarinal, and right infrahilar lymph nodes likely reactive CXR 02/19/23 - mild interval improvement lingular opacities    Assessment & Plan:   Chronic bronchitis - asymptomatic --For future exacerbations would benefit from inhalers as she tolerated Breztri well with 1-2 puffs as needed --Discussed clinical course and management of asthma including bronchodilator regimen, preventive care including vaccinations and action plan for exacerbation.  I have spent a total time of 20-minutes on the day of the appointment including chart review, data review, collecting history, coordinating care and discussing medical diagnosis and plan with the patient/family. Past medical history, allergies, medications were reviewed. Pertinent imaging, labs and tests included in this note have been reviewed and interpreted independently by me.  Mechele Collin, MD [facility] 02/05/2024 2:26 PM

## 2024-02-05 NOTE — Patient Instructions (Signed)
 Chronic bronchitis - symptom free --For future exacerbations would benefit from inhalers as she tolerated Breztri well with 1-2 puffs as needed --Discussed clinical course and management of asthma including bronchodilator regimen, preventive care including vaccinations and action plan for exacerbation.

## 2024-02-06 NOTE — Telephone Encounter (Signed)
 [name] completed and given to daughter

## 2024-02-20 ENCOUNTER — Ambulatory Visit (INDEPENDENT_AMBULATORY_CARE_PROVIDER_SITE_OTHER)

## 2024-02-20 ENCOUNTER — Encounter: Payer: Self-pay | Admitting: Family

## 2024-02-20 ENCOUNTER — Ambulatory Visit: Admitting: Family

## 2024-02-20 VITALS — BP 126/67 | HR 86 | Temp 98.0°F | Ht 65.0 in | Wt 179.0 lb

## 2024-02-20 DIAGNOSIS — R051 Acute cough: Secondary | ICD-10-CM | POA: Diagnosis not present

## 2024-02-20 DIAGNOSIS — J42 Unspecified chronic bronchitis: Secondary | ICD-10-CM | POA: Diagnosis not present

## 2024-02-20 MED ORDER — DOXYCYCLINE HYCLATE 100 MG PO TABS: 100.0000 mg | ORAL_TABLET | Freq: Two times a day (BID) | ORAL | 0 refills | Status: DC

## 2024-02-20 MED ORDER — PREDNISONE 10 MG (21) PO TBPK: ORAL_TABLET | ORAL | 0 refills | Status: DC

## 2024-02-20 NOTE — Progress Notes (Signed)
 Subjective:    Patient ID: [name], female    DOB: [birth year], 73 y.o.   MRN: [id number]  Chief Complaint  Patient presents with   Cough    For 2 weeks    PT presents to the office today with cough for two weeks.  Cough This is a new problem. The current episode started 1 to 4 weeks ago. The problem has been gradually worsening. The problem occurs every few minutes. The cough is Productive of sputum. Associated symptoms include chills (at night), a fever (two nights), headaches, myalgias, nasal congestion, postnasal drip, rhinorrhea and shortness of breath. Pertinent negatives include no ear congestion, ear pain, sore throat or wheezing. She has tried rest [name]) for the symptoms. The treatment provided mild relief.      Review of Systems  Constitutional:  Positive for chills (at night) and fever (two nights).  HENT:  Positive for postnasal drip and rhinorrhea. Negative for ear pain and sore throat.   Respiratory:  Positive for cough and shortness of breath. Negative for wheezing.   Musculoskeletal:  Positive for myalgias.  Neurological:  Positive for headaches.  All other systems reviewed and are negative.   Social History   Socioeconomic History   Marital status: Married    Spouse name: [name]   Number of children: 2   Years of education: Not on file   Highest education level: Associate degree: occupational, Scientist, product/process development, or vocational program  Occupational History   Not on file  Tobacco Use   Smoking status: Never   Smokeless tobacco: Never  Vaping Use   Vaping status: Never Used  Substance and Sexual Activity   Alcohol use: No   Drug use: Never   Sexual activity: Not on file  Other Topics Concern   Not on file  Social History Narrative   5 grandchildren   Social Drivers of Health   Financial Resource Strain: Low Risk  (09/29/2023)   Overall Financial Resource Strain (CARDIA)    Difficulty of Paying Living Expenses: Not hard at all  Food Insecurity: No  Food Insecurity (09/29/2023)   Hunger Vital Sign    Worried About Running Out of Food in the Last Year: Never true    Ran Out of Food in the Last Year: Never true  Transportation Needs: No Transportation Needs (09/29/2023)   PRAPARE - Administrator, Civil Service (Medical): No    Lack of Transportation (Non-Medical): No  Physical Activity: Insufficiently Active (09/29/2023)   Exercise Vital Sign    Days of Exercise per Week: 1 day    Minutes of Exercise per Session: 30 min  Stress: Stress Concern Present (09/29/2023)   [employer] of Occupational Health - Occupational Stress Questionnaire    Feeling of Stress : To some extent  Social Connections: Socially Integrated (09/29/2023)   Social Connection and Isolation Panel [NHANES]    Frequency of Communication with Friends and Family: More than three times a week    Frequency of Social Gatherings with Friends and Family: More than three times a week    Attends Religious Services: More than 4 times per year    Active Member of [employer] or Organizations: Yes    Attends Engineer, structural: More than 4 times per year    Marital Status: Married   Family History  Problem Relation Age of Onset   Heart disease Mother    Hypertension Mother    Alcohol abuse Father    Breast cancer Neg Hx  Objective:   Physical Exam Vitals reviewed.  Constitutional:      General: She is not in acute distress.    Appearance: She is well-developed.  HENT:     Head: Normocephalic and atraumatic.     Right Ear: Tympanic membrane normal.     Left Ear: Tympanic membrane normal.  Eyes:     Pupils: Pupils are equal, round, and reactive to light.  Neck:     Thyroid: No thyromegaly.  Cardiovascular:     Rate and Rhythm: Normal rate and regular rhythm.     Heart sounds: Normal heart sounds. No murmur heard. Pulmonary:     Effort: Pulmonary effort is normal. No respiratory distress.     Breath sounds: Rhonchi present. No  wheezing.  Abdominal:     General: Bowel sounds are normal. There is no distension.     Palpations: Abdomen is soft.     Tenderness: There is no abdominal tenderness.  Musculoskeletal:        General: No tenderness. Normal range of motion.     Cervical back: Normal range of motion and neck supple.  Skin:    General: Skin is warm and dry.  Neurological:     Mental Status: She is alert and oriented to person, place, and time.     Cranial Nerves: No cranial nerve deficit.     Deep Tendon Reflexes: Reflexes are normal and symmetric.  Psychiatric:        Behavior: Behavior normal.        Thought Content: Thought content normal.        Judgment: Judgment normal.       BP 126/67   Pulse 86   Temp 98 F (36.7 C) (Temporal)   Ht 5\' 5"  (1.651 m)   Wt 179 lb (81.2 kg)   SpO2 97%   BMI 29.79 kg/m      Assessment & Plan:  [name] comes in today with chief complaint of Cough (For 2 weeks/)   Diagnosis and orders addressed:  1. Chronic bronchitis, unspecified chronic bronchitis type (HCC) (Primary) - Take meds as prescribed - Use a cool mist humidifier  -Use saline nose sprays frequently -Force fluids -For any cough or congestion  Use plain Mucinex- regular strength or max strength is fine -For fever or aces or pains- take tylenol or ibuprofen. -Throat lozenges if help -Follow up if symptoms worsen or do not improve  - doxycycline (VIBRA-TABS) 100 MG tablet; Take 1 tablet (100 mg total) by mouth 2 (two) times daily.  Dispense: 20 tablet; Refill: 0 - predniSONE (STERAPRED UNI-PAK 21 TAB) 10 MG (21) TBPK tablet; Use as directed  Dispense: 21 tablet; Refill: 0 - DG Chest 2 View; Future  2. Acute cough  - DG Chest 2 View; Future     Jannifer Rodney, FNP

## 2024-02-20 NOTE — Patient Instructions (Signed)
Chronic Bronchitis, Adult  Chronic bronchitis is inflammation inside of the main airways (bronchi) that come off the windpipe (trachea) in the lungs. The swelling causes the airways to narrow and make more mucus than normal. This can make it hard to breathe and may cause coughing or noisy breathing (wheezing). This condition is a type of chronic obstructive pulmonary disease (COPD). Chronic bronchitis is often associated with other chronic respiratory conditions, such as emphysema, asthma, bronchiectasis, or cystic fibrosis. Chronic bronchitis is a long-term (chronic) condition. It is defined as a chronic cough with mucus (sputum) production: For at least 3 months of the year. For 2 years in a row. People with chronic bronchitis are more likely to get colds and other infections in the nose, throat, or airways. What are the causes? This condition is most often caused by: A history of smoking. Exposure to secondhand smoke or a smoky area for a long period of time. Frequent lung infections. Long-term exposure to certain fumes or chemicals that irritate the lungs. What are the signs or symptoms? Symptoms of chronic bronchitis may include: A cough that brings up mucus (productive cough). A whistling sound when you breathe (wheezing). Shortness of breath. Chest tightness or soreness. Fever or chills. Colds or respiratory infections that go away and return. How is this diagnosed? Your health care provider may diagnose this condition based on your signs and symptoms, especially if you have a cough that lasts a long time or keeps coming back. This condition may be diagnosed based on: Your symptoms and medical history. A physical exam, including listening to your lungs. Tests, such as: Testing a sputum sample. Blood tests. A chest X-ray. Tests of lung (pulmonary) function. How is this treated? There is no cure for chronic bronchitis. Treatment may help control your symptoms. This  includes: Drinking fluids. This may help thin your mucus so it is easier to cough up. Mucus-clearing techniques. Your health care provider will show you which techniques are best for you. Medicines such as: Inhaled medicine (inhaler) to improve air flow in and out of your lungs. Antibiotics to treat or prevent bacterial lung infections. Mucus-thinning medicines. Pulmonary rehabilitation. This is a program that helps you learn how to manage your breathing problem. The program may include exercise, education, counseling, treatment, and support. Using oxygen therapy, if your blood oxygen level is very low. Follow these instructions at home: Medicines Take over-the-counter and prescription medicines only as told by your health care provider. If you were prescribed an antibiotic medicine, take it as told by your health care provider. Do not stop taking the antibiotic even if you start to feel better. Lifestyle  Do not use any products that contain nicotine or tobacco. These products include cigarettes, chewing tobacco, and vaping devices, such as e-cigarettes. If you need help quitting, ask your health care provider. Stay away from other people's smoke (secondhand smoke) and any irritants that make you cough more, such as chemical fumes. Eat a healthy diet and get regular exercise. Talk with your health care provider about what activities are safe for you. Return to normal activities as told by your health care provider. Ask your health care provider what activities are safe for you. Preventing infections Stay up to date on all immunizations, including the pneumonia and flu vaccines. Wash your hands often with soap and water for at least 20 seconds. If soap and water are not available, use hand sanitizer. Avoid contact with people who have symptoms of a cold or the flu. Keep  your environment free from any known allergens such as dust, mold, pets, and pollen. General instructions Get plenty of  rest. Drink enough fluids to keep your urine pale yellow. Use oxygen therapy at home as directed. Follow instructions from your health care provider about how to use oxygen safely and take steps to prevent fire. Do not smoke while using oxygen or allow others to smoke in your home. Keep all follow-up visits. This is important. Contact a health care provider if: Your shortness of breath or coughing gets worse even when you take medicine. Your mucus gets thicker or changes color. You are not able to cough up your mucus. You have a fever. Get help right away if: You cough up blood. You have trouble breathing. You have chest pain. You feel dizzy or confused. These symptoms may represent a serious problem that is an emergency. Do not wait to see if the symptoms will go away. Get medical help right away. Call your local emergency services (911 in the [place] Do not drive yourself to the hospital. Summary Chronic bronchitis is inflammation inside of the main airways (bronchi) that come off the windpipe (trachea) in the lungs. The swelling causes the airways to narrow and make more mucus than normal. Chronic bronchitis is a long-term (chronic) condition. It is defined as a chronic cough with mucus (sputum) production for at least 3 months of the year for 2 years in a row. If you were prescribed an antibiotic medicine, take it as told by your health care provider. Do not stop taking the antibiotic even if you start to feel better. Do not use any products that contain nicotine or tobacco. These products include cigarettes, chewing tobacco, and vaping devices, such as e-cigarettes. If you need help quitting, ask your health care provider. This information is not intended to replace advice given to you by your health care provider. Make sure you discuss any questions you have with your health care provider. Document Revised: 03/14/2021 Document Reviewed: 03/14/2021 Elsevier Patient Education  2024  [employer].

## 2024-03-01 ENCOUNTER — Other Ambulatory Visit: Payer: Self-pay | Admitting: Family

## 2024-03-01 DIAGNOSIS — I700 Atherosclerosis of aorta: Secondary | ICD-10-CM | POA: Insufficient documentation

## 2024-03-22 ENCOUNTER — Ambulatory Visit (INDEPENDENT_AMBULATORY_CARE_PROVIDER_SITE_OTHER)

## 2024-03-22 ENCOUNTER — Encounter: Payer: Self-pay | Admitting: Nurse Practitioner

## 2024-03-22 ENCOUNTER — Ambulatory Visit (INDEPENDENT_AMBULATORY_CARE_PROVIDER_SITE_OTHER): Admitting: Nurse Practitioner

## 2024-03-22 VITALS — BP 122/72 | HR 85 | Temp 96.4°F | Ht 65.0 in | Wt 180.0 lb

## 2024-03-22 DIAGNOSIS — M7989 Other specified soft tissue disorders: Secondary | ICD-10-CM | POA: Diagnosis not present

## 2024-03-22 DIAGNOSIS — Z96653 Presence of artificial knee joint, bilateral: Secondary | ICD-10-CM | POA: Diagnosis not present

## 2024-03-22 DIAGNOSIS — M25562 Pain in left knee: Secondary | ICD-10-CM | POA: Diagnosis not present

## 2024-03-22 NOTE — Patient Instructions (Signed)
Contusion A contusion is a deep bruise. This is a result of an injury that causes bleeding under the skin. Symptoms of bruising include pain, swelling, and discolored skin. The skin may turn blue, purple, or yellow. Follow these instructions at home: Managing pain, stiffness, and swelling You may use RICE. This stands for: Resting. Icing. Compression, or putting pressure on the injured area. Elevating, or raising the injured area. To follow this method, do these actions: Rest the injured area. If told, put ice on the injured area. To do this: Put ice in a plastic bag. Place a towel between your skin and the bag. Leave the ice on for 20 minutes, 2-3 times per day. If your skin turns bright red, take off the ice right away to prevent skin damage. The risk of skin damage is higher if you cannot feel pain, heat, or cold. If told, apply compression on the injured area using an elastic bandage. Make sure the bandage is not too tight. If the area tingles or has a loss of feeling (numbness), remove it and put it back on as told by your doctor. If possible, elevate the injured area above the level of your heart while you are sitting or lying down.  General instructions Take over-the-counter and prescription medicines only as told by your doctor. Keep all follow-up visits. Your doctor may want to see how your contusion is healing with treatment. Contact a doctor if: Your symptoms do not get better after several days of treatment. Your symptoms get worse. You have trouble moving the injured area. Get help right away if: You have very bad pain. You have a loss of feeling (numbness) in a hand or foot. Your hand or foot turns pale or cold. This information is not intended to replace advice given to you by your health care provider. Make sure you discuss any questions you have with your health care provider. Document Revised: 04/29/2022 Document Reviewed: 04/29/2022 Elsevier Patient Education  2024  [employer].

## 2024-03-22 NOTE — Progress Notes (Signed)
   Subjective:    Patient ID: [name], female    DOB: 1951-03-25, 73 y.o.   MRN: [id number]   Chief Complaint: Knee Pain   Knee Pain    Patient comes in having tripped over her dog a few days ago. Left knee has been hurting her every since.  Patient Active Problem List   Diagnosis Date Noted   Aortic atherosclerosis (HCC) 03/01/2024   Gastroesophageal reflux disease without esophagitis 09/30/2022   Metabolic syndrome 04/24/2021   BMI 30.0-30.9,adult 04/24/2021   Hyperlipidemia 06/01/2020   GAD (generalized anxiety disorder) 05/21/2019       Review of Systems  Musculoskeletal:  Positive for arthralgias (left knee).       Objective:   Physical Exam Constitutional:      Appearance: Normal appearance. She is obese.  Cardiovascular:     Rate and Rhythm: Normal rate and regular rhythm.     Heart sounds: Normal heart sounds.  Pulmonary:     Effort: Pulmonary effort is normal.     Breath sounds: Normal breath sounds.  Musculoskeletal:     Comments: Left knee contusion Mild effuison Xray - normal  Neurological:     General: No focal deficit present.     Mental Status: She is alert and oriented to person, place, and time.  Psychiatric:        Mood and Affect: Mood normal.        Behavior: Behavior normal.    BP 122/72   Pulse 85   Temp (!) 96.4 F (35.8 C) (Temporal)   Ht 5\' 5"  (1.651 m)   Wt 180 lb (81.6 kg)   SpO2 97%   BMI 29.95 kg/m         Assessment & Plan:   Liva Schaal Gosnell in today with chief complaint of Knee Pain   1. Acute pain of left knee (Primary) Ice Elevate Motrin  OTC   - DG Knee 1-2 Views Left    The above assessment and management plan was discussed with the patient. The patient verbalized understanding of and has agreed to the management plan. Patient is aware to call the clinic if symptoms persist or worsen. Patient is aware when to return to the clinic for a follow-up visit. Patient educated on when it is appropriate to go  to the emergency department.   Mary-Margaret Gaylyn Keas, FNP

## 2024-03-30 ENCOUNTER — Other Ambulatory Visit: Payer: Self-pay | Admitting: Nurse Practitioner

## 2024-03-30 ENCOUNTER — Ambulatory Visit (INDEPENDENT_AMBULATORY_CARE_PROVIDER_SITE_OTHER)

## 2024-03-30 ENCOUNTER — Encounter: Payer: Self-pay | Admitting: Nurse Practitioner

## 2024-03-30 ENCOUNTER — Ambulatory Visit (INDEPENDENT_AMBULATORY_CARE_PROVIDER_SITE_OTHER): Payer: Medicare HMO | Admitting: Nurse Practitioner

## 2024-03-30 VITALS — BP 120/74 | HR 79 | Temp 97.6°F | Ht 65.0 in | Wt 179.0 lb

## 2024-03-30 DIAGNOSIS — Z0001 Encounter for general adult medical examination with abnormal findings: Secondary | ICD-10-CM | POA: Diagnosis not present

## 2024-03-30 DIAGNOSIS — Z0000 Encounter for general adult medical examination without abnormal findings: Secondary | ICD-10-CM

## 2024-03-30 DIAGNOSIS — E8881 Metabolic syndrome: Secondary | ICD-10-CM | POA: Diagnosis not present

## 2024-03-30 DIAGNOSIS — Z1231 Encounter for screening mammogram for malignant neoplasm of breast: Secondary | ICD-10-CM

## 2024-03-30 DIAGNOSIS — F411 Generalized anxiety disorder: Secondary | ICD-10-CM | POA: Diagnosis not present

## 2024-03-30 DIAGNOSIS — K219 Gastro-esophageal reflux disease without esophagitis: Secondary | ICD-10-CM | POA: Diagnosis not present

## 2024-03-30 DIAGNOSIS — Z13820 Encounter for screening for osteoporosis: Secondary | ICD-10-CM | POA: Diagnosis not present

## 2024-03-30 DIAGNOSIS — Z23 Encounter for immunization: Secondary | ICD-10-CM

## 2024-03-30 DIAGNOSIS — Z6830 Body mass index (BMI) 30.0-30.9, adult: Secondary | ICD-10-CM | POA: Diagnosis not present

## 2024-03-30 DIAGNOSIS — E782 Mixed hyperlipidemia: Secondary | ICD-10-CM

## 2024-03-30 LAB — LIPID PANEL

## 2024-03-30 NOTE — Progress Notes (Signed)
 Subjective:    Patient ID: [name], female    DOB: Apr 19, 1951, 73 y.o.   MRN: [id number]   Chief Complaint: annual physical  HPI:  [name] is a 73 y.o. who identifies as a female who was assigned female at birth.   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 Mixed hyperlipidemia Does try to watch diet and stays very active. She is now on repatha . Lab Results  Component Value Date   CHOL 192 09/30/2023   HDL 46 09/30/2023   LDLCALC 117 (H) 09/30/2023   TRIG 167 (H) 09/30/2023   CHOLHDL 4.2 09/30/2023   The 10-year ASCVD risk score (Arnett DK, et al., 2019) is: 8.9%   2. Metabolic syndrome Does try to watch diet. Does not check blood sugars at home. Lab Results  Component Value Date   HGBA1C 5.3 03/31/2023     3. GAD (generalized anxiety disorder) Is on xanax  as needed. Takes 3-4 x a week    03/30/2024    9:54 AM 02/20/2024   11:19 AM 09/30/2023    9:15 AM 03/31/2023    9:53 AM  GAD 7 : Generalized Anxiety Score  Nervous, Anxious, on Edge 0 0 0 0  Control/stop worrying 0 0 0 0  Worry too much - different things 0 0 0 0  Trouble relaxing 2 0 2 2  Restless 0 0 1 0  Easily annoyed or irritable 3 0 1 1  Afraid - awful might happen 0 0 0 0  Total GAD 7 Score 5 0 4 3  Anxiety Difficulty Somewhat difficult Not difficult at all Somewhat difficult Somewhat difficult        4. GERD Only takes OTC meds as needed   5. BMI 30.0-30.9,adult Weight is down 1 lbs  Wt Readings from Last 3 Encounters:  03/30/24 179 lb (81.2 kg)  03/22/24 180 lb (81.6 kg)  02/20/24 179 lb (81.2 kg)   BMI Readings from Last 3 Encounters:  03/30/24 29.79 kg/m  03/22/24 29.95 kg/m  02/20/24 29.79 kg/m       New complaints: None today  Allergies  Allergen Reactions   Bee Venom Swelling   Rosuvastatin  Other (See Comments)    myopathy   Lipitor  [Atorvastatin ] Other (See Comments)    myopathy   Outpatient Encounter Medications as of 03/30/2024  Medication Sig   ALPRAZolam  (XANAX ) 0.5 MG tablet Take 1 tablet by mouth twice daily as needed for anxiety   Budeson-Glycopyrrol-Formoterol (BREZTRI  AEROSPHERE) 160-9-4.8 MCG/ACT AERO Inhale 2 puffs into the lungs in the morning and at bedtime.   escitalopram  (LEXAPRO ) 20 MG tablet Take 1 tablet (20 mg total) by mouth daily.   Evolocumab  (REPATHA  SURECLICK) 140 MG/ML SOAJ INJECT 140MG  SUBCUTANEOUSLY EVERY 14 DAYS   meclizine  (ANTIVERT ) 25 MG tablet Take 1 tablet (25 mg total) by mouth 3 (three) times daily as needed for dizziness.   Polyethyl Glycol-Propyl Glycol (SYSTANE FREE OP) Place 1 drop into both eyes daily as needed (dry eyes).   No facility-administered encounter medications on file as of 03/30/2024.    Past Surgical History:  Procedure Laterality Date   BACK SURGERY     CESAREAN SECTION     KNEE SURGERY     Bilaterally    Family History  Problem Relation Age of Onset   Heart disease Mother    Hypertension Mother  Alcohol abuse Father    Breast cancer Neg Hx       Controlled substance contract: n/a     Review of Systems  Constitutional:  Negative for diaphoresis.  Eyes:  Negative for pain.  Respiratory:  Negative for shortness of breath.   Cardiovascular:  Negative for chest pain, palpitations and leg swelling.  Gastrointestinal:  Negative for abdominal pain.  Endocrine: Negative for polydipsia.  Skin:  Negative for rash.  Neurological:  Negative for dizziness, weakness and headaches.  Hematological:  Does not bruise/bleed easily.  All other systems reviewed and are negative.      Objective:   Physical Exam Vitals and nursing note reviewed.  Constitutional:      General: She is not in acute distress.    Appearance: Normal appearance. She is well-developed.  HENT:     Head: Normocephalic.     Right Ear: Tympanic membrane normal.     Left Ear: Tympanic  membrane normal.     Nose: Nose normal.     Mouth/Throat:     Mouth: Mucous membranes are moist.  Eyes:     Pupils: Pupils are equal, round, and reactive to light.  Neck:     Vascular: No carotid bruit or JVD.  Cardiovascular:     Rate and Rhythm: Normal rate and regular rhythm.     Heart sounds: Normal heart sounds.  Pulmonary:     Effort: Pulmonary effort is normal. No respiratory distress.     Breath sounds: Normal breath sounds. No wheezing or rales.  Chest:     Chest wall: No tenderness.  Abdominal:     General: Bowel sounds are normal. There is no distension or abdominal bruit.     Palpations: Abdomen is soft. There is no hepatomegaly, splenomegaly, mass or pulsatile mass.     Tenderness: There is no abdominal tenderness.  Musculoskeletal:        General: Normal range of motion.     Cervical back: Normal range of motion and neck supple.  Lymphadenopathy:     Cervical: No cervical adenopathy.  Skin:    General: Skin is warm and dry.  Neurological:     Mental Status: She is alert and oriented to person, place, and time.     Deep Tendon Reflexes: Reflexes are normal and symmetric.  Psychiatric:        Behavior: Behavior normal.        Thought Content: Thought content normal.        Judgment: Judgment normal.    BP 120/74   Pulse 79   Temp 97.6 F (36.4 C) (Temporal)   Ht 5\' 5"  (1.651 m)   Wt 179 lb (81.2 kg)   SpO2 98%   BMI 29.79 kg/m          Assessment & Plan:   [name] comes in today with chief complaint of annual physical   Diagnosis and orders addressed:  1. Mixed hyperlipidemia Low fat diet - CBC with Differential/Platelet - CMP14+EGFR - Lipid panel  2. Metabolic syndrome Watch carbs in diet  3. GAD (generalized anxiety disorder) Stress management - escitalopram  (LEXAPRO ) 20 MG tablet; Take 1 tablet (20 mg total) by mouth daily.  Dispense: 30 tablet; Refill: 5  4. BMI 30.0-30.9,adult Discussed diet and exercise for person with BMI  >25 Will recheck weight in 3-6 months    Labs pending Health Maintenance reviewed Diet and exercise encouraged  Follow up plan: 6 months   Mary-Margaret Gaylyn Keas, FNP

## 2024-03-30 NOTE — Addendum Note (Signed)
 Addended by: Cherylyn Cos on: 03/30/2024 10:29 AM   Modules accepted: Orders

## 2024-03-31 LAB — CBC WITH DIFFERENTIAL/PLATELET
Basophils Absolute: 0.1 10*3/uL (ref 0.0–0.2)
Basos: 1 %
EOS (ABSOLUTE): 0.4 10*3/uL (ref 0.0–0.4)
Eos: 7 %
Hematocrit: 39.2 % (ref 34.0–46.6)
Hemoglobin: 12.4 g/dL (ref 11.1–15.9)
Immature Grans (Abs): 0 10*3/uL (ref 0.0–0.1)
Immature Granulocytes: 0 %
Lymphocytes Absolute: 1.8 10*3/uL (ref 0.7–3.1)
Lymphs: 35 %
MCH: 28.8 pg (ref 26.6–33.0)
MCHC: 31.6 g/dL (ref 31.5–35.7)
MCV: 91 fL (ref 79–97)
Monocytes Absolute: 0.2 10*3/uL (ref 0.1–0.9)
Monocytes: 5 %
Neutrophils Absolute: 2.6 10*3/uL (ref 1.4–7.0)
Neutrophils: 52 %
Platelets: 183 10*3/uL (ref 150–450)
RBC: 4.31 x10E6/uL (ref 3.77–5.28)
RDW: 13.6 % (ref 11.7–15.4)
WBC: 5 10*3/uL (ref 3.4–10.8)

## 2024-03-31 LAB — THYROID PANEL WITH TSH
Free Thyroxine Index: 1.4 (ref 1.2–4.9)
T3 Uptake Ratio: 21 % — ABNORMAL LOW (ref 24–39)
T4, Total: 6.7 ug/dL (ref 4.5–12.0)
TSH: 3.42 u[IU]/mL (ref 0.450–4.500)

## 2024-03-31 LAB — CMP14+EGFR
ALT: 39 IU/L — ABNORMAL HIGH (ref 0–32)
AST: 44 IU/L — ABNORMAL HIGH (ref 0–40)
Albumin: 4.3 g/dL (ref 3.9–4.9)
Alkaline Phosphatase: 165 IU/L — ABNORMAL HIGH (ref 44–121)
BUN/Creatinine Ratio: 16 (ref 12–28)
BUN: 12 mg/dL (ref 8–27)
Bilirubin Total: 0.4 mg/dL (ref 0.0–1.2)
CO2: 22 mmol/L (ref 20–29)
Calcium: 9.4 mg/dL (ref 8.7–10.3)
Chloride: 105 mmol/L (ref 96–106)
Creatinine, Ser: 0.75 mg/dL (ref 0.57–1.00)
Globulin, Total: 2.5 g/dL (ref 1.5–4.5)
Glucose: 97 mg/dL (ref 70–99)
Potassium: 4.3 mmol/L (ref 3.5–5.2)
Sodium: 143 mmol/L (ref 134–144)
Total Protein: 6.8 g/dL (ref 6.0–8.5)
eGFR: 86 mL/min/{1.73_m2} (ref >59)

## 2024-03-31 LAB — LIPID PANEL
Cholesterol, Total: 192 mg/dL (ref 100–199)
HDL: 43 mg/dL (ref >39)
LDL CALC COMMENT:: 4.5 ratio — ABNORMAL HIGH (ref 0.0–4.4)
LDL Chol Calc (NIH): 117 mg/dL — ABNORMAL HIGH (ref 0–99)
Triglycerides: 183 mg/dL — ABNORMAL HIGH (ref 0–149)
VLDL Cholesterol Cal: 32 mg/dL (ref 5–40)

## 2024-04-01 DIAGNOSIS — Z780 Asymptomatic menopausal state: Secondary | ICD-10-CM | POA: Diagnosis not present

## 2024-04-01 DIAGNOSIS — M8589 Other specified disorders of bone density and structure, multiple sites: Secondary | ICD-10-CM | POA: Diagnosis not present

## 2024-04-15 ENCOUNTER — Other Ambulatory Visit: Payer: Self-pay | Admitting: Nurse Practitioner

## 2024-04-15 DIAGNOSIS — E782 Mixed hyperlipidemia: Secondary | ICD-10-CM

## 2024-04-22 ENCOUNTER — Other Ambulatory Visit (HOSPITAL_COMMUNITY): Payer: Self-pay

## 2024-05-10 ENCOUNTER — Telehealth: Payer: Self-pay

## 2024-05-10 ENCOUNTER — Other Ambulatory Visit (HOSPITAL_COMMUNITY): Payer: Self-pay

## 2024-05-10 NOTE — Telephone Encounter (Signed)
 Pharmacy Patient Advocate Encounter   Received notification from CoverMyMeds that prior authorization for Repatha  Sure Click is required/requested.   Insurance verification completed.   The patient is insured through [facility] .   Per test claim: PA required; PA submitted to above mentioned insurance via CoverMyMeds Key/confirmation #/EOC [id number] Status is pending

## 2024-05-10 NOTE — Telephone Encounter (Signed)
 Pharmacy Patient Advocate Encounter  Received notification from [facility] that Prior Authorization for Repatha  SureClick 140MG /ML auto-injectors  has been APPROVED from 05/10/24 to 05/10/25. Ran test claim, Copay is $141. This test claim was processed through [facility]- copay amounts may vary at other pharmacies due to pharmacy/plan contracts, or as the patient moves through the different stages of their insurance plan.   PA #/Case ID/Reference #:  [id number]

## 2024-05-11 ENCOUNTER — Encounter

## 2024-05-11 NOTE — Progress Notes (Signed)
 This encounter was created in error - please disregard.  called and spoke w/pt, per pt has a sore throat and can't hardly talk. Per pt will call to reschedule her apt. alia t/cma

## 2024-05-19 ENCOUNTER — Ambulatory Visit
Admission: RE | Admit: 2024-05-19 | Discharge: 2024-05-19 | Disposition: A | Discharge status: Home / Self Care | Source: Ambulatory Visit | Attending: Nurse Practitioner | Admitting: Nurse Practitioner

## 2024-05-19 DIAGNOSIS — Z1231 Encounter for screening mammogram for malignant neoplasm of breast: Secondary | ICD-10-CM

## 2024-06-03 ENCOUNTER — Ambulatory Visit (INDEPENDENT_AMBULATORY_CARE_PROVIDER_SITE_OTHER)

## 2024-06-03 DIAGNOSIS — Z23 Encounter for immunization: Secondary | ICD-10-CM

## 2024-06-10 DIAGNOSIS — H52223 Regular astigmatism, bilateral: Secondary | ICD-10-CM | POA: Diagnosis not present

## 2024-06-10 DIAGNOSIS — H524 Presbyopia: Secondary | ICD-10-CM | POA: Diagnosis not present

## 2024-08-06 ENCOUNTER — Telehealth

## 2024-08-10 ENCOUNTER — Encounter (HOSPITAL_BASED_OUTPATIENT_CLINIC_OR_DEPARTMENT_OTHER): Payer: Self-pay | Admitting: Pulmonary Disease

## 2024-08-10 ENCOUNTER — Ambulatory Visit (HOSPITAL_BASED_OUTPATIENT_CLINIC_OR_DEPARTMENT_OTHER): Admitting: Pulmonary Disease

## 2024-08-10 VITALS — BP 145/69 | HR 84 | Ht 65.0 in | Wt 180.9 lb

## 2024-08-10 DIAGNOSIS — J209 Acute bronchitis, unspecified: Secondary | ICD-10-CM

## 2024-08-10 MED ORDER — PREDNISONE 10 MG PO TABS
ORAL_TABLET | ORAL | 0 refills | Status: AC
Start: 2024-08-10 — End: 2024-08-18

## 2024-08-10 MED ORDER — AZITHROMYCIN 250 MG PO TABS: ORAL_TABLET | ORAL | 0 refills | Status: DC

## 2024-08-10 MED ORDER — BREZTRI AEROSPHERE 160-9-4.8 MCG/ACT IN AERO: 2.0000 | INHALATION_SPRAY | Freq: Two times a day (BID) | RESPIRATORY_TRACT | Status: DC

## 2024-08-10 NOTE — Progress Notes (Signed)
 Subjective:    Patient ID: [name], female    DOB: 1951-09-04, 73 y.o.   MRN: [id number]  HPI  Chief Complaint  Patient presents with   Follow-up    Chronic bronchitis, Pt has been experiencing cough and congestion x 2 weeks   02/19/23 73 year old female never smoker previously followed by Dr. Jude. Recently established care with after recent hospitalization for pneumonia. Note reviewed from 02/03/23 with nearly resolved left infiltrate but still had persistent symptoms of productive cough so treated with Augmentin . She was starting to improve on the antibiotics her productive cough improved by 50% however restarted after completing it. Worsening cough and wheezing especially at night. Reports chills. No fevers.   05/28/23 Since our last visit she has come off inhaler and cough syrup. She still has some allergy symptoms during May/June and October. Starting to improve. Triggered by hay. Her last episode was prolonged and required hospitalization in Feb.  02/05/24 So far this is her best year. No respiratory infections. Not currently on maintenance inhalers. Denies shortness of breath, cough or wheezing. Tried to get RSV vaccine but [facility] pharmacy stated she was not of age.   08/10/24 Since our last visit she was overall doing well until last week her husband cut hay. Now has cough and chest congestion, initially started as sinus symptoms last week. A little bit of wheezing. She started breztri  x 2 days. Has had some chills. No fever.   History reviewed. No pertinent past medical history.   Past Surgical History:  Procedure Laterality Date   BACK SURGERY     CESAREAN SECTION     KNEE SURGERY     Bilaterally   Allergies  Allergen Reactions   Bee Venom Swelling   Rosuvastatin  Other (See Comments)    myopathy   Lipitor [Atorvastatin ] Other (See Comments)    myopathy    Review of Systems  Constitutional:  Negative for chills, diaphoresis and fever.  HENT:  Negative for  congestion.   Respiratory:  Positive for cough and wheezing. Negative for shortness of breath.   Cardiovascular:  Negative for chest pain, palpitations and leg swelling.  Allergic/Immunologic: Positive for environmental allergies.      Objective:   Physical Exam: General: Well-appearing, no acute distress HENT: [place], AT Eyes: EOMI, no scleral icterus Respiratory: Clear to auscultation bilaterally.  No crackles, wheezing or rales Cardiovascular: RRR, -M/R/G, no JVD Extremities:-Edema,-tenderness Neuro: AAO x4, CNII-XII grossly intact Psych: Normal mood, normal affect   CTA chest 01/21/23  Increase in consolidation in the right lower lobe and lingula favoring multilobar pneumonia. There is also some new hazy ground-glass opacity and linear opacity in the left lower lobe Mild increase in size of right paratracheal, subcarinal, and right infrahilar lymph nodes likely reactive CXR 02/19/23 - mild interval improvement lingular opacities    Assessment & Plan:  73 year old female never smoker with chronic bronchitis who presents for follow-up. Asymptomatic this summer off of maintenance inhalers (Breztri ) but recently triggered by hay.   Acute on chronic bronchitis  --Prednisone  taper --Azithromycin   --If symptoms persistent, consider CXR  I have spent a total time of 30-minutes on the day of the appointment including chart review, data review, collecting history, coordinating care and discussing medical diagnosis and plan with the patient/family. Past medical history, allergies, medications were reviewed. Pertinent imaging, labs and tests included in this note have been reviewed and interpreted independently by me.  Slater Staff, MD [facility] Pulmonary/Critical Care Medicine 08/10/2024  11:33 AM

## 2024-08-10 NOTE — Patient Instructions (Addendum)
 Acute on chronic bronchitis  --Prednisone  taper --Azithromycin   --Use breztri  1-2 puffs in the morning and evening until symptoms resolve

## 2024-08-16 ENCOUNTER — Encounter (HOSPITAL_BASED_OUTPATIENT_CLINIC_OR_DEPARTMENT_OTHER): Payer: Self-pay | Admitting: Pulmonary Disease

## 2024-08-16 ENCOUNTER — Ambulatory Visit (HOSPITAL_BASED_OUTPATIENT_CLINIC_OR_DEPARTMENT_OTHER): Payer: Self-pay | Admitting: Pulmonary Disease

## 2024-08-16 NOTE — Telephone Encounter (Unsigned)
 Copied from CRM #[id number]. Topic: Clinical - Pink Word Triage >> Aug 16, 2024  3:16 PM Nathanel DEL wrote: Reason for CRM: pt following up on message from earlier today.  Pt still having lots of congestion.  she may need some more medication. There is a my chart message.   Pt wants moremeds

## 2024-08-16 NOTE — Telephone Encounter (Signed)
 FYI Only or Action Required?: Action required by provider: medication refill request and update on patient condition.  Patient is followed in Pulmonology for chronic bronchitis, last seen on 08/10/2024 by Janice Acquanetta Bradley, MD.  Called Nurse Triage reporting Cough.  Symptoms began several weeks ago.  Interventions attempted: Prescription medications: Zithromax , Prednisone , inhalers.  Symptoms are: gradually improving.  Triage Disposition: See PCP When Office is Open (Within 3 Days)  Patient/caregiver understands and will follow disposition?: No, wishes to speak with PCP    Copied from CRM #[id number]. Topic: Clinical - Pink Word Triage >> Aug 16, 2024  3:16 PM [name] wrote: Reason for CRM: pt following up on message from earlier today.  Pt still having lots of congestion.  she may need some more medication. There is a my chart message.   Pt wants more meds     Reason for Disposition  Cough has been present for > 3 weeks  Answer Assessment - Initial Assessment Questions Patient advised to call and let Dr. Kassie know if her symptoms are still present. Patient states her symptoms are improved but are still present and would like to know what her next steps should be and if she should be prescribed another round of medication. Please advise.     1. ONSET: When did the cough begin?      About 3 weeks  2. SEVERITY: How bad is the cough today?      Moderate  3. SPUTUM: Describe the color of your sputum (e.g., none, dry cough; clear, white, yellow, green)     Yellowish 4. HEMOPTYSIS: Are you coughing up any blood? If Yes, ask: How much? (e.g., flecks, streaks, tablespoons, etc.)     No 5. DIFFICULTY BREATHING: Are you having difficulty breathing? If Yes, ask: How bad is it? (e.g., mild, moderate, severe)      Improved from previous visit 6. FEVER: Do you have a fever? If Yes, ask: What is your temperature, how was it measured, and when did it start?     No 7.  CARDIAC HISTORY: Do you have any history of heart disease? (e.g., heart attack, congestive heart failure)      Yes  8. LUNG HISTORY: Do you have any history of lung disease?  (e.g., pulmonary embolus, asthma, emphysema)     Yes 9. PE RISK FACTORS: Do you have a history of blood clots? (or: recent major surgery, recent prolonged travel, bedridden)     No 10. OTHER SYMPTOMS: Do you have any other symptoms? (e.g., runny nose, wheezing, chest pain)       Congestion  Protocols used: Cough - Acute Productive-A-AH

## 2024-08-16 NOTE — Telephone Encounter (Signed)
**Note De-identified  [place] Obfuscation** Please advise 

## 2024-08-16 NOTE — Telephone Encounter (Signed)
 1st attempt to contact the patient, no answer, LVM to call back, routing for additional attempts     Copied from CRM #[id number]. Topic: Clinical - Pink Word Triage >> Aug 16, 2024  3:16 PM [name] wrote: Reason for CRM: pt following up on message from earlier today.  Pt still having lots of congestion.  she may need some more medication. There is a my chart message.   Pt wants moremeds

## 2024-08-17 MED ORDER — PREDNISONE 10 MG PO TABS: ORAL_TABLET | ORAL | 0 refills | Status: AC

## 2024-08-17 NOTE — Telephone Encounter (Signed)
Duplicate- please see telephone encounter.

## 2024-09-03 ENCOUNTER — Encounter (HOSPITAL_BASED_OUTPATIENT_CLINIC_OR_DEPARTMENT_OTHER): Payer: Self-pay

## 2024-09-03 ENCOUNTER — Emergency Department (HOSPITAL_BASED_OUTPATIENT_CLINIC_OR_DEPARTMENT_OTHER)
Admission: EM | Admit: 2024-09-03 | Discharge: 2024-09-03 | Disposition: A | Discharge status: Home / Self Care | Attending: Emergency Medicine | Admitting: Emergency Medicine

## 2024-09-03 ENCOUNTER — Other Ambulatory Visit: Payer: Self-pay

## 2024-09-03 ENCOUNTER — Emergency Department (HOSPITAL_BASED_OUTPATIENT_CLINIC_OR_DEPARTMENT_OTHER): Admitting: Radiology

## 2024-09-03 ENCOUNTER — Emergency Department (HOSPITAL_BASED_OUTPATIENT_CLINIC_OR_DEPARTMENT_OTHER)

## 2024-09-03 DIAGNOSIS — S0003XA Contusion of scalp, initial encounter: Secondary | ICD-10-CM | POA: Diagnosis not present

## 2024-09-03 DIAGNOSIS — W1839XA Other fall on same level, initial encounter: Secondary | ICD-10-CM | POA: Insufficient documentation

## 2024-09-03 DIAGNOSIS — M545 Low back pain, unspecified: Secondary | ICD-10-CM | POA: Diagnosis not present

## 2024-09-03 DIAGNOSIS — S300XXA Contusion of lower back and pelvis, initial encounter: Secondary | ICD-10-CM | POA: Diagnosis not present

## 2024-09-03 DIAGNOSIS — S0990XA Unspecified injury of head, initial encounter: Secondary | ICD-10-CM

## 2024-09-03 MED ORDER — ACETAMINOPHEN 500 MG PO TABS
1000.0000 mg | ORAL_TABLET | Freq: Once | ORAL | Status: AC
  Administered 2024-09-03: 1000 mg via ORAL
  Filled 2024-09-03: qty 2

## 2024-09-03 NOTE — ED Triage Notes (Signed)
 Pt reports falling while riding on a hoverboard. Pt reports striking head against hard floor. Pt reports lower back pain. Pt denies any LOC. Pt endorses feeling dizzy and sick after fall. Pt denies any blood thinners.

## 2024-09-03 NOTE — ED Provider Notes (Signed)
 [place] EMERGENCY DEPARTMENT AT [facility] Provider Note   CSN: [id number] Arrival date & time: 09/03/24  1518     Patient presents with: [name] is a 73 y.o. female.   Patient is a 73 year old female without significant medical problems who is presenting today after a fall.  She was with her grandkids and tried to use a Agricultural engineer.  When she stood on the hover board it caused her to fall backward hitting her buttocks and then her head on the floor.  She did not have any loss of consciousness but did complain afterwards of feeling lightheaded and nauseated.  The nausea has resolved but she still feels a little bit lightheaded.  She is also having pain in her coccyx area and in her left hip.  She does have some pain when she lifts her leg but has been able to ambulate.  No numbness or tingling in her arms or legs.  She denies any neck pain.  She does not take anticoagulation.  The history is provided by the patient.  Fall       Prior to Admission medications   Medication Sig Start Date End Date Taking? Authorizing Provider  ALPRAZolam  (XANAX ) 0.5 MG tablet Take 1 tablet by mouth twice daily as needed for anxiety 10/27/23   Gladis Mustard, FNP  azithromycin  (ZITHROMAX ) 250 MG tablet Take two tablets on day 1, then one tablet daily on day 2-5. 08/10/24   Kassie Acquanetta Bradley, MD  Budeson-Glycopyrrol-Formoterol (BREZTRI  AEROSPHERE) 160-9-4.8 MCG/ACT AERO Inhale 2 puffs into the lungs in the morning and at bedtime. 05/28/23   Kassie Acquanetta Bradley, MD  budesonide-glycopyrrolate-formoterol (BREZTRI  AEROSPHERE) 160-9-4.8 MCG/ACT AERO inhaler Inhale 2 puffs into the lungs in the morning and at bedtime. 08/10/24   Kassie Acquanetta Bradley, MD  escitalopram  (LEXAPRO ) 20 MG tablet Take 1 tablet (20 mg total) by mouth daily. 09/30/23   Gladis Mustard, FNP  Evolocumab  (REPATHA  SURECLICK) 140 MG/ML SOAJ INJECT 140 MC SUBCUTANEOUSLY EVERY 14 DAYS 04/15/24   Gladis, Mary-Margaret,  FNP  meclizine  (ANTIVERT ) 25 MG tablet Take 1 tablet (25 mg total) by mouth 3 (three) times daily as needed for dizziness. 12/30/22   Gladis Mary-Margaret, FNP  Polyethyl Glycol-Propyl Glycol (SYSTANE FREE OP) Place 1 drop into both eyes daily as needed (dry eyes).    [provider]    Allergies: Bee venom, Rosuvastatin , and Lipitor [atorvastatin ]    Review of Systems  Updated Vital Signs BP (!) 147/73   Pulse 83   Temp 98.3 F (36.8 C)   Resp 16   Ht 5' 4 (1.626 m)   Wt 81.6 kg   SpO2 100%   BMI 30.90 kg/m   Physical Exam Vitals and nursing note reviewed.  Constitutional:      General: She is not in acute distress.    Appearance: She is well-developed.  HENT:     Head: Normocephalic.      Comments: Hematoma on the occiput    Mouth/Throat:     Mouth: Mucous membranes are moist.  Eyes:     Extraocular Movements: Extraocular movements intact.     Conjunctiva/sclera: Conjunctivae normal.     Pupils: Pupils are equal, round, and reactive to light.  Cardiovascular:     Rate and Rhythm: Normal rate and regular rhythm.     Heart sounds: Normal heart sounds. No murmur heard.    No friction rub.  Pulmonary:     Effort: Pulmonary effort is normal.  Breath sounds: Normal breath sounds. No wheezing or rales.  Abdominal:     General: Bowel sounds are normal. There is no distension.     Palpations: Abdomen is soft.     Tenderness: There is no abdominal tenderness. There is no guarding or rebound.  Musculoskeletal:        General: No tenderness. Normal range of motion.     Comments: No edema.  Minimal tenderness noted with palpation in the hip joint but full range of motion.  Pain in the lower lumbar and sacral area to palpation.  Skin:    General: Skin is warm and dry.     Findings: No rash.  Neurological:     Mental Status: She is alert and oriented to person, place, and time. Mental status is at baseline.     Cranial Nerves: No cranial nerve deficit.   Psychiatric:        Behavior: Behavior normal.     (all labs ordered are listed, but only abnormal results are displayed) Labs Reviewed - No data to display  EKG: None  Radiology: CT Head Wo Contrast Result Date: 09/03/2024 EXAM: CT HEAD AND CERVICAL SPINE 09/03/2024 05:12:14 PM TECHNIQUE: CT of the head and cervical spine was performed without the administration of intravenous contrast. Multiplanar reformatted images are provided for review. Automated exposure control, iterative reconstruction, and/or weight based adjustment of the mA/kV was utilized to reduce the radiation dose to as low as reasonably achievable. COMPARISON: None available. CLINICAL HISTORY: Head trauma, minor (Age >= 65y). FINDINGS: CT HEAD BRAIN AND VENTRICLES: No acute intracranial hemorrhage. No mass effect or midline shift. No abnormal extra-axial fluid collection. No evidence of acute infarct. No hydrocephalus. ORBITS: No acute abnormality. SINUSES AND MASTOIDS: No acute abnormality. SOFT TISSUES AND SKULL: No acute skull fracture. No acute soft tissue abnormality. CT CERVICAL SPINE BONES AND ALIGNMENT: No acute fracture or traumatic malalignment. DEGENERATIVE CHANGES: No significant degenerative changes. SOFT TISSUES: High right posterior scalp contusion. IMPRESSION: 1. No acute intracranial abnormality. 2. No acute fracture or traumatic malalignment of the cervical spine. 3. High right posterior scalp contusion. Electronically signed by: Gilmore Molt MD 09/03/2024 05:27 PM EDT RP Workstation: [id number]   CT Cervical Spine Wo Contrast Result Date: 09/03/2024 EXAM: CT HEAD AND CERVICAL SPINE 09/03/2024 05:12:14 PM TECHNIQUE: CT of the head and cervical spine was performed without the administration of intravenous contrast. Multiplanar reformatted images are provided for review. Automated exposure control, iterative reconstruction, and/or weight based adjustment of the mA/kV was utilized to reduce the radiation dose to  as low as reasonably achievable. COMPARISON: None available. CLINICAL HISTORY: Head trauma, minor (Age >= 65y). FINDINGS: CT HEAD BRAIN AND VENTRICLES: No acute intracranial hemorrhage. No mass effect or midline shift. No abnormal extra-axial fluid collection. No evidence of acute infarct. No hydrocephalus. ORBITS: No acute abnormality. SINUSES AND MASTOIDS: No acute abnormality. SOFT TISSUES AND SKULL: No acute skull fracture. No acute soft tissue abnormality. CT CERVICAL SPINE BONES AND ALIGNMENT: No acute fracture or traumatic malalignment. DEGENERATIVE CHANGES: No significant degenerative changes. SOFT TISSUES: High right posterior scalp contusion. IMPRESSION: 1. No acute intracranial abnormality. 2. No acute fracture or traumatic malalignment of the cervical spine. 3. High right posterior scalp contusion. Electronically signed by: Gilmore Molt MD 09/03/2024 05:27 PM EDT RP Workstation: [id number]   DG Lumbar Spine Complete Result Date: 09/03/2024 CLINICAL DATA:  Pain after a fall from hover board. Low back pain. Dizzy and sick feeling. EXAM: LUMBAR SPINE - COMPLETE 4+ VIEW COMPARISON:  None Available. FINDINGS: Five lumbar-type vertebral bodies. Normal alignment of the lumbar spine and facet joints. No vertebral compression deformities. No focal bone lesion or bone destruction. Bone cortex appears intact. Degenerative changes with narrowed disc spaces and endplate osteophyte formation most prominent at L5-S1. Visualized sacrum appears intact. SI joints are not displaced. IMPRESSION: Mild degenerative changes in the lumbar spine. No acute displaced fractures are identified. Electronically Signed   By: Elsie Gravely M.D.   On: 09/03/2024 17:26     Procedures   Medications Ordered in the ED  acetaminophen  (TYLENOL ) tablet 1,000 mg (1,000 mg Oral Given 09/03/24 1730)                                    Medical Decision Making Amount and/or Complexity of Data Reviewed Radiology: ordered and  independent interpretation performed. Decision-making details documented in ED Course.  Risk OTC drugs.   Pt  presenting today with a complaint that caries a high risk for morbidity and mortality.] Patient presenting today after a fall.  She did sustain a head injury but is awake and alert.  Mentating normally within her room neurologic exam.  Also having some pain in the sacrum and mildly in the left hip.  Patient does not take anticoagulation.  Head, cervical spine and lumbar films are pending.  Patient given Tylenol  for pain.  I have independently visualized and interpreted pt's images today. Head CT negative for acute bleed today and the cervical spine without signs of fracture.  Lumbar images show no evidence of acute spinal fracture.  Radiology reports a high riding posterior scalp contusion but no acute findings otherwise.  Findings discussed with the patient.  At this time she is stable for discharge home.  She was given concussion precautions and return precautions.  She and her family member are comfortable with this plan.     Final diagnoses:  Injury of head, initial encounter  Contusion of scalp, initial encounter  Coccyx contusion, initial encounter    ED Discharge Orders     None          Doretha Folks, MD 09/03/24 1744

## 2024-09-03 NOTE — Discharge Instructions (Signed)
 The CAT scan was abnormal without any bleeding in your brain.  It is okay to sleep tonight.  You do have the big bruise on your scalp and that will be sore for some days.  No sign of any fractures in your spine or pelvis.  Use Tylenol  or ibuprofen  as needed.  If you start having any vision changes, difficulty walking or recurrent vomiting or confusion return to the emergency room immediately.

## 2024-09-09 ENCOUNTER — Other Ambulatory Visit: Payer: Self-pay | Admitting: Nurse Practitioner

## 2024-09-09 DIAGNOSIS — F411 Generalized anxiety disorder: Secondary | ICD-10-CM

## 2024-09-14 ENCOUNTER — Encounter: Payer: Self-pay | Admitting: Nurse Practitioner

## 2024-09-14 ENCOUNTER — Ambulatory Visit (INDEPENDENT_AMBULATORY_CARE_PROVIDER_SITE_OTHER): Admitting: Nurse Practitioner

## 2024-09-14 VITALS — BP 134/73 | HR 77 | Temp 97.1°F | Ht 65.0 in | Wt 185.2 lb

## 2024-09-14 DIAGNOSIS — S300XXD Contusion of lower back and pelvis, subsequent encounter: Secondary | ICD-10-CM

## 2024-09-14 MED ORDER — KETOROLAC TROMETHAMINE 60 MG/2ML IM SOLN
60.0000 mg | Freq: Once | INTRAMUSCULAR | Status: AC
  Administered 2024-09-14: 60 mg via INTRAMUSCULAR

## 2024-09-14 MED ORDER — PREDNISONE 20 MG PO TABS: ORAL_TABLET | ORAL | 0 refills | Status: DC

## 2024-09-14 MED ORDER — METHYLPREDNISOLONE ACETATE 80 MG/ML IJ SUSP
80.0000 mg | Freq: Once | INTRAMUSCULAR | Status: AC
  Administered 2024-09-14: 80 mg via INTRAMUSCULAR

## 2024-09-14 NOTE — Progress Notes (Signed)
   Subjective:    Chief Complaint: hospital follow up  HPI  Patient was taken to the ED on 09/09/24. She was riding her grandsons hover board and fell of landing on buttocks then her  head. SHe did not lose conscienousness. Head CT was negative. She denies headache or blurred vision. Slight vertigo on occasion. Her real complaint is  low back pain. Rates pain 8/10. Worse when sitting. Taking tylenol  and advil  which helps a little. Patient Active Problem List   Diagnosis Date Noted   Aortic atherosclerosis 03/01/2024   Gastroesophageal reflux disease without esophagitis 09/30/2022   Metabolic syndrome 04/24/2021   BMI 30.0-30.9,adult 04/24/2021   Hyperlipidemia 06/01/2020   GAD (generalized anxiety disorder) 05/21/2019       Review of Systems  Musculoskeletal:  Positive for back pain.       Objective:   Physical Exam Constitutional:      Appearance: Normal appearance.  Cardiovascular:     Rate and Rhythm: Normal rate and regular rhythm.     Heart sounds: Normal heart sounds.  Pulmonary:     Breath sounds: Normal breath sounds.  Musculoskeletal:     Comments: FROM of lumbar spine with pain on standing and walking No pain when sitting. No distal numbness or tingling  Neurological:     General: No focal deficit present.     Mental Status: She is alert and oriented to person, place, and time.  Psychiatric:        Mood and Affect: Mood normal.        Behavior: Behavior normal.     BP 134/73   Pulse 77   Temp (!) 97.1 F (36.2 C) (Skin)   Ht 5' 5 (1.651 m)   Wt 185 lb 3.2 oz (84 kg)   SpO2 97%   BMI 30.82 kg/m        Assessment & Plan:   [name] in today with chief complaint of Hospitalization Follow-up   1. Contusion of coccyx, subsequent encounter (Primary) Moist heat Sit on cushion RTO prn - methylPREDNISolone  acetate (DEPO-MEDROL ) injection 80 mg - ketorolac (TORADOL) injection 60 mg - predniSONE  (DELTASONE ) 20 MG tablet; 2 po at sametime  daily for 5 days-  Dispense: 10 tablet; Refill: 0  2. Hospital follow up Hospital records reviewed  The above assessment and management plan was discussed with the patient. The patient verbalized understanding of and has agreed to the management plan. Patient is aware to call the clinic if symptoms persist or worsen. Patient is aware when to return to the clinic for a follow-up visit. Patient educated on when it is appropriate to go to the emergency department.   Janice Gladis, FNP

## 2024-09-30 ENCOUNTER — Ambulatory Visit: Payer: Self-pay | Admitting: Nurse Practitioner

## 2024-09-30 NOTE — Progress Notes (Deleted)
 Subjective:    Patient ID: [name], female    DOB: [birth year], 73 y.o.   MRN: [id number]   Chief Complaint: medical management of chronic issues     HPI:  [name] is a 73 y.o. who identifies as a female who was assigned female at birth.   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 Mixed hyperlipidemia Does try to watch diet and stays very active. She is now on repatha . Lab Results  Component Value Date   CHOL 192 03/30/2024   HDL 43 03/30/2024   LDLCALC 117 (H) 03/30/2024   TRIG 183 (H) 03/30/2024   CHOLHDL 4.5 (H) 03/30/2024   The 10-year ASCVD risk score (Arnett DK, et al., 2019) is: 10.7%   2. Metabolic syndrome Does try to watch diet. Does not check blood sugars at home. Lab Results  Component Value Date   HGBA1C 5.3 03/31/2023     3. GAD (generalized anxiety disorder) Is on xanax  as needed. Takes 3-4 x a week    09/14/2024   11:29 AM 03/30/2024    9:54 AM 02/20/2024   11:19 AM 09/30/2023    9:15 AM  GAD 7 : Generalized Anxiety Score  Nervous, Anxious, on Edge 1 0 0 0  Control/stop worrying 0 0 0 0  Worry too much - different things 0 0 0 0  Trouble relaxing 2 2 0 2  Restless 1 0 0 1  Easily annoyed or irritable 1 3 0 1  Afraid - awful might happen 0 0 0 0  Total GAD 7 Score 5 5 0 4  Anxiety Difficulty Not difficult at all Somewhat difficult Not difficult at all Somewhat difficult      4. GERD Has had no recent flare ups   5. BMI 30.0-30.9,adult Weight is down 5 lbs Wt Readings from Last 3 Encounters:  09/14/24 185 lb 3.2 oz (84 kg)  09/03/24 180 lb (81.6 kg)  08/10/24 180 lb 14.4 oz (82.1 kg)   BMI Readings from Last 3 Encounters:  09/14/24 30.82 kg/m  09/03/24 30.90 kg/m  08/10/24 30.10 kg/m      New complaints: None today  Allergies  Allergen Reactions   Bee Venom Swelling   Rosuvastatin  Other (See  Comments)    myopathy   Lipitor [Atorvastatin ] Other (See Comments)    myopathy   Outpatient Encounter Medications as of 09/30/2024  Medication Sig   ALPRAZolam  (XANAX ) 0.5 MG tablet Take 1 tablet by mouth twice daily as needed for anxiety   azithromycin  (ZITHROMAX ) 250 MG tablet Take two tablets on day 1, then one tablet daily on day 2-5.   Budeson-Glycopyrrol-Formoterol (BREZTRI  AEROSPHERE) 160-9-4.8 MCG/ACT AERO Inhale 2 puffs into the lungs in the morning and at bedtime.   budesonide-glycopyrrolate-formoterol (BREZTRI  AEROSPHERE) 160-9-4.8 MCG/ACT AERO inhaler Inhale 2 puffs into the lungs in the morning and at bedtime.   escitalopram  (LEXAPRO ) 20 MG tablet Take 1 tablet by mouth once daily   Evolocumab  (REPATHA  SURECLICK) 140 MG/ML SOAJ INJECT 140 MC SUBCUTANEOUSLY EVERY 14 DAYS   meclizine  (ANTIVERT ) 25 MG tablet Take 1 tablet (25 mg total) by mouth 3 (three) times daily as needed for dizziness.   Polyethyl Glycol-Propyl Glycol (SYSTANE FREE OP) Place 1 drop into both eyes daily as needed (dry eyes).   predniSONE  (DELTASONE ) 20 MG tablet 2 po at sametime daily for 5 days-  No facility-administered encounter medications on file as of 09/30/2024.    Past Surgical History:  Procedure Laterality Date   BACK SURGERY     CESAREAN SECTION     KNEE SURGERY     Bilaterally    Family History  Problem Relation Age of Onset   Heart disease Mother    Hypertension Mother    Alcohol abuse Father    Breast cancer Neg Hx       Controlled substance contract: n/a     Review of Systems  Constitutional:  Negative for diaphoresis.  Eyes:  Negative for pain.  Respiratory:  Negative for shortness of breath.   Cardiovascular:  Negative for chest pain, palpitations and leg swelling.  Gastrointestinal:  Negative for abdominal pain.  Endocrine: Negative for polydipsia.  Skin:  Negative for rash.  Neurological:  Negative for dizziness, weakness and headaches.  Hematological:  Does not  bruise/bleed easily.  All other systems reviewed and are negative.      Objective:   Physical Exam Vitals and nursing note reviewed.  Constitutional:      General: She is not in acute distress.    Appearance: Normal appearance. She is well-developed.  HENT:     Head: Normocephalic.     Right Ear: Tympanic membrane normal.     Left Ear: Tympanic membrane normal.     Nose: Nose normal.     Mouth/Throat:     Mouth: Mucous membranes are moist.  Eyes:     Pupils: Pupils are equal, round, and reactive to light.  Neck:     Vascular: No carotid bruit or JVD.  Cardiovascular:     Rate and Rhythm: Normal rate and regular rhythm.     Heart sounds: Normal heart sounds.  Pulmonary:     Effort: Pulmonary effort is normal. No respiratory distress.     Breath sounds: Normal breath sounds. No wheezing or rales.  Chest:     Chest wall: No tenderness.  Abdominal:     General: Bowel sounds are normal. There is no distension or abdominal bruit.     Palpations: Abdomen is soft. There is no hepatomegaly, splenomegaly, mass or pulsatile mass.     Tenderness: There is no abdominal tenderness.  Musculoskeletal:        General: Normal range of motion.     Cervical back: Normal range of motion and neck supple.  Lymphadenopathy:     Cervical: No cervical adenopathy.  Skin:    General: Skin is warm and dry.  Neurological:     Mental Status: She is alert and oriented to person, place, and time.     Deep Tendon Reflexes: Reflexes are normal and symmetric.  Psychiatric:        Behavior: Behavior normal.        Thought Content: Thought content normal.        Judgment: Judgment normal.    There were no vitals taken for this visit.        Assessment & Plan:   [name] comes in today with chief complaint of No chief complaint on file.   Diagnosis and orders addressed:  1. Mixed hyperlipidemia Low fat diet - CBC with Differential/Platelet - CMP14+EGFR - Lipid panel  2. Metabolic  syndrome Watch carbs in diet  3. GAD (generalized anxiety disorder) Stress management - escitalopram  (LEXAPRO ) 20 MG tablet; Take 1 tablet (20 mg total) by mouth daily.  Dispense: 30 tablet; Refill: 5  4. BMI 30.0-30.9,adult Discussed diet and  exercise for person with BMI >25 Will recheck weight in 3-6 months    Labs pending Health Maintenance reviewed Diet and exercise encouraged  Follow up plan: 6 months   [name], FNP

## 2024-11-02 ENCOUNTER — Encounter: Payer: Self-pay | Admitting: Nurse Practitioner

## 2024-11-02 ENCOUNTER — Ambulatory Visit: Admitting: Nurse Practitioner

## 2024-11-02 VITALS — BP 147/79 | HR 95 | Temp 97.3°F | Ht 65.0 in | Wt 186.0 lb

## 2024-11-02 DIAGNOSIS — R509 Fever, unspecified: Secondary | ICD-10-CM | POA: Diagnosis not present

## 2024-11-02 LAB — VERITOR SARS-COV-2 AND FLU A+B
BD Veritor SARS-CoV-2 Ag: NEGATIVE
Influenza A: NEGATIVE
Influenza B: NEGATIVE

## 2024-11-02 MED ORDER — AMOXICILLIN-POT CLAVULANATE 875-125 MG PO TABS: 1.0000 | ORAL_TABLET | Freq: Two times a day (BID) | ORAL | 0 refills | Status: AC

## 2024-11-02 NOTE — Patient Instructions (Signed)
 1. Take meds as prescribed 2. Use a cool mist humidifier especially during the winter months and when heat has been humid. 3. Use saline nose sprays frequently 4. Saline irrigations of the nose can be very helpful if done frequently.  * 4X daily for 1 week*  * Use of a nettie pot can be helpful with this. Follow directions with this* 5. Drink plenty of fluids 6. Keep thermostat turn down low 7.For any cough or congestion- claritin D 8. For fever or aces or pains- take tylenol  or ibuprofen  appropriate for age and weight.  * for fevers greater than 101 orally you may alternate ibuprofen  and tylenol  every  3 hours.

## 2024-11-02 NOTE — Progress Notes (Signed)
 Subjective:    Patient ID: [name], female    DOB: 03/21/1951, 73 y.o.   MRN: [id number]   Chief Complaint: ear pain  HPI  Patient comes in today c/o dizziness that started back again this week. Last night she started getting sick. Has lots of congestion. No cough, no fever.   Patient Active Problem List   Diagnosis Date Noted   Aortic atherosclerosis 03/01/2024   Gastroesophageal reflux disease without esophagitis 09/30/2022   Metabolic syndrome 04/24/2021   BMI 30.0-30.9,adult 04/24/2021   Hyperlipidemia 06/01/2020   GAD (generalized anxiety disorder) 05/21/2019       Review of Systems  Constitutional:  Positive for chills. Negative for fever.  HENT:  Positive for congestion, rhinorrhea, sinus pressure and sinus pain.   Respiratory:  Negative for cough and shortness of breath.        Objective:   Physical Exam Constitutional:      Appearance: Normal appearance.  HENT:     Right Ear: Tympanic membrane normal.     Left Ear: Tympanic membrane normal.     Nose: Congestion and rhinorrhea present.     Right Sinus: Maxillary sinus tenderness present.     Left Sinus: Maxillary sinus tenderness present.  Cardiovascular:     Rate and Rhythm: Normal rate and regular rhythm.     Pulses: Normal pulses.     Heart sounds: Normal heart sounds.  Pulmonary:     Breath sounds: Normal breath sounds.  Skin:    General: Skin is warm.  Neurological:     General: No focal deficit present.     Mental Status: She is alert and oriented to person, place, and time.  Psychiatric:        Mood and Affect: Mood normal.        Behavior: Behavior normal.    BP (!) 147/79   Pulse 95   Temp (!) 97.3 F (36.3 C) (Temporal)   Ht 5' 5 (1.651 m)   Wt 186 lb (84.4 kg)   SpO2 97%   BMI 30.95 kg/m         Assessment & Plan:   [name] in today with chief complaint of Dizziness, Neck Pain, and Fever   1. Fever, unspecified fever cause (Primary) 1. Take meds as  prescribed 2. Use a cool mist humidifier especially during the winter months and when heat has been humid. 3. Use saline nose sprays frequently 4. Saline irrigations of the nose can be very helpful if done frequently.  * 4X daily for 1 week*  * Use of a nettie pot can be helpful with this. Follow directions with this* 5. Drink plenty of fluids 6. Keep thermostat turn down low 7.For any cough or congestion- claritin D 8. For fever or aces or pains- take tylenol  or ibuprofen  appropriate for age and weight.  * for fevers greater than 101 orally you may alternate ibuprofen  and tylenol  every  3 hours.    - Veritor SARS-CoV-2 and Flu A+B- all negative  Meds ordered this encounter  Medications   amoxicillin -clavulanate (AUGMENTIN ) 875-125 MG tablet    Sig: Take 1 tablet by mouth 2 (two) times daily.    Dispense:  14 tablet    Refill:  0    Supervising Provider:   MARYANNE CHEW A [[id number]]     The above assessment and management plan was discussed with the patient. The patient verbalized understanding of and has agreed to the management plan. Patient is aware  to call the clinic if symptoms persist or worsen. Patient is aware when to return to the clinic for a follow-up visit. Patient educated on when it is appropriate to go to the emergency department.   [name], FNP

## 2024-11-04 ENCOUNTER — Ambulatory Visit: Payer: Self-pay | Admitting: Nurse Practitioner

## 2024-11-17 ENCOUNTER — Other Ambulatory Visit: Payer: Self-pay | Admitting: Nurse Practitioner

## 2024-11-17 DIAGNOSIS — F411 Generalized anxiety disorder: Secondary | ICD-10-CM

## 2024-11-17 DIAGNOSIS — E782 Mixed hyperlipidemia: Secondary | ICD-10-CM

## 2024-11-22 ENCOUNTER — Other Ambulatory Visit: Payer: Self-pay

## 2024-11-22 ENCOUNTER — Other Ambulatory Visit: Payer: Self-pay | Admitting: Nurse Practitioner

## 2024-11-22 DIAGNOSIS — F411 Generalized anxiety disorder: Secondary | ICD-10-CM

## 2024-11-22 MED ORDER — ALPRAZOLAM 0.5 MG PO TABS: 0.5000 mg | ORAL_TABLET | Freq: Two times a day (BID) | ORAL | 2 refills | Status: AC | PRN

## 2024-11-22 MED ORDER — ESCITALOPRAM OXALATE 20 MG PO TABS: 20.0000 mg | ORAL_TABLET | Freq: Every day | ORAL | 1 refills | Status: DC

## 2024-11-22 NOTE — Progress Notes (Signed)
 Meds ordered this encounter  Medications   ALPRAZolam  (XANAX ) 0.5 MG tablet    Sig: Take 1 tablet (0.5 mg total) by mouth 2 (two) times daily as needed. for anxiety    Dispense:  60 tablet    Refill:  2    Supervising Provider:   MARYANNE CHEW A [[id number]]   [name], FNP

## 2024-11-23 ENCOUNTER — Other Ambulatory Visit: Payer: Self-pay

## 2024-11-23 DIAGNOSIS — F411 Generalized anxiety disorder: Secondary | ICD-10-CM

## 2024-11-23 MED ORDER — ESCITALOPRAM OXALATE 20 MG PO TABS: 20.0000 mg | ORAL_TABLET | Freq: Every day | ORAL | 1 refills | Status: AC

## 2024-12-20 ENCOUNTER — Telehealth: Payer: Self-pay | Admitting: Pharmacist

## 2024-12-20 DIAGNOSIS — E782 Mixed hyperlipidemia: Secondary | ICD-10-CM

## 2024-12-20 DIAGNOSIS — G720 Drug-induced myopathy: Secondary | ICD-10-CM

## 2025-01-11 ENCOUNTER — Other Ambulatory Visit
